# Patient Record
Sex: Female | Born: 1955
Health system: Southern US, Community
[De-identification: ages and names within clinical notes are randomized; demographics above are authoritative.]

## PROBLEM LIST (undated history)

## (undated) DIAGNOSIS — E119 Type 2 diabetes mellitus without complications: Secondary | ICD-10-CM

## (undated) DIAGNOSIS — J45909 Unspecified asthma, uncomplicated: Secondary | ICD-10-CM

## (undated) DIAGNOSIS — R7303 Prediabetes: Secondary | ICD-10-CM

## (undated) DIAGNOSIS — N200 Calculus of kidney: Secondary | ICD-10-CM

## (undated) HISTORY — PX: KIDNEY STONE SURGERY: SHX686

## (undated) HISTORY — DX: Prediabetes: R73.03

## (undated) HISTORY — DX: Type 2 diabetes mellitus without complications: E11.9

---

## 1999-01-09 ENCOUNTER — Encounter: Payer: Self-pay | Admitting: Family Medicine

## 1999-01-09 ENCOUNTER — Encounter: Admission: RE | Admit: 1999-01-09 | Discharge: 1999-01-09 | Payer: Self-pay | Admitting: Family Medicine

## 2000-12-27 ENCOUNTER — Encounter: Admission: RE | Admit: 2000-12-27 | Discharge: 2000-12-27 | Payer: Self-pay | Admitting: Family Medicine

## 2000-12-27 ENCOUNTER — Encounter: Payer: Self-pay | Admitting: Family Medicine

## 2006-01-14 ENCOUNTER — Emergency Department (HOSPITAL_COMMUNITY): Admission: EM | Admit: 2006-01-14 | Discharge: 2006-01-14 | Payer: Self-pay | Admitting: Emergency Medicine

## 2006-10-19 ENCOUNTER — Encounter: Admission: RE | Admit: 2006-10-19 | Discharge: 2006-10-19 | Payer: Self-pay | Admitting: Family Medicine

## 2007-10-15 ENCOUNTER — Emergency Department (HOSPITAL_COMMUNITY): Admission: EM | Admit: 2007-10-15 | Discharge: 2007-10-16 | Payer: Self-pay | Admitting: Emergency Medicine

## 2007-11-24 ENCOUNTER — Ambulatory Visit (HOSPITAL_COMMUNITY): Admission: RE | Admit: 2007-11-24 | Discharge: 2007-11-24 | Payer: Self-pay | Admitting: *Deleted

## 2010-05-22 IMAGING — CT CT ABDOMEN W/O CM
2 of 4 series · 17 of 46 positions shown, 19 images · non-contrast
Comparison: Ultrasound 10/19/2006

CT ABDOMEN

CLINICAL DATA: Generalized abdominal pain.

CT ABDOMEN AND PELVIS WITHOUT CONTRAST
TECHNIQUE: Multidetector CT imaging of the abdomen and pelvis was
performed following the standard
protocol without intravenous contrast.

[Series 2: abd_ 5.0 b40f st · axial · 0.59mm/px · z∈[-429,-24]mm · 14 of 89 slices shown, 16 images]
[im 4/89  soft-tissue]
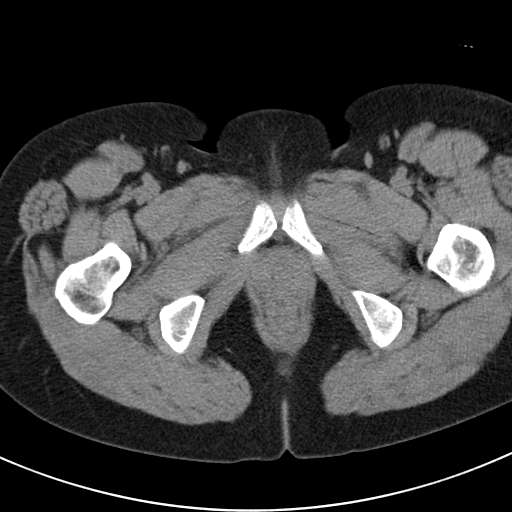
[im 4/89  bone]
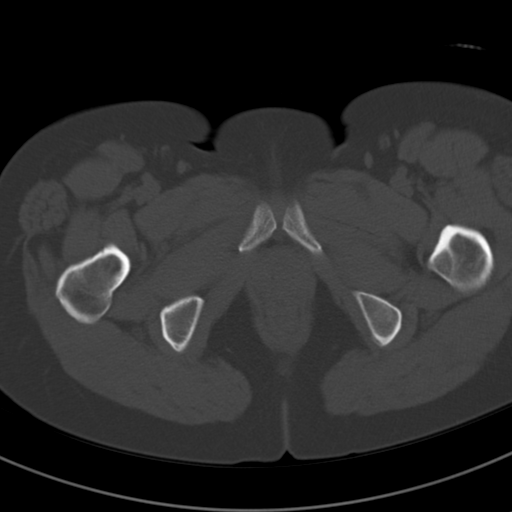
[im 12/89  soft-tissue]
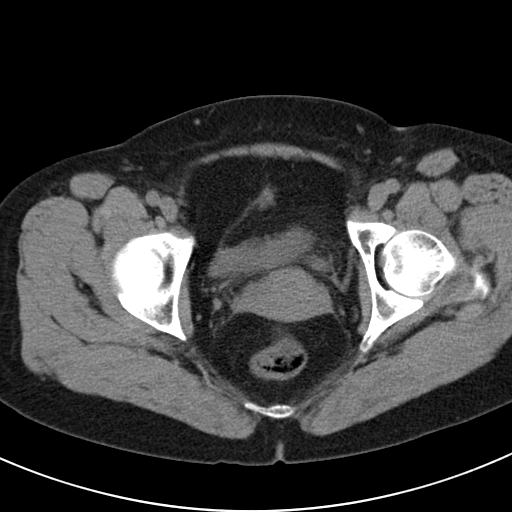
[im 19/89  soft-tissue]
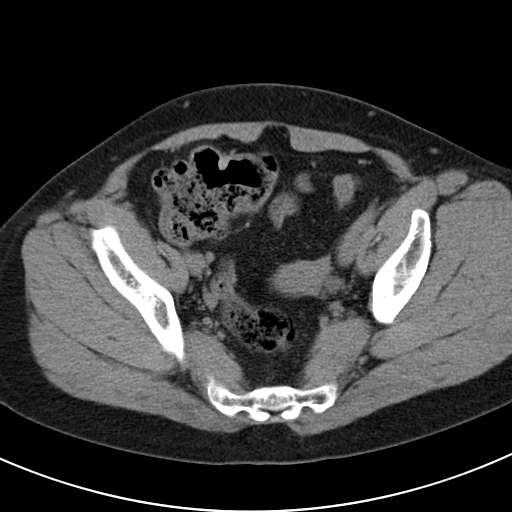
[im 23/89  soft-tissue]
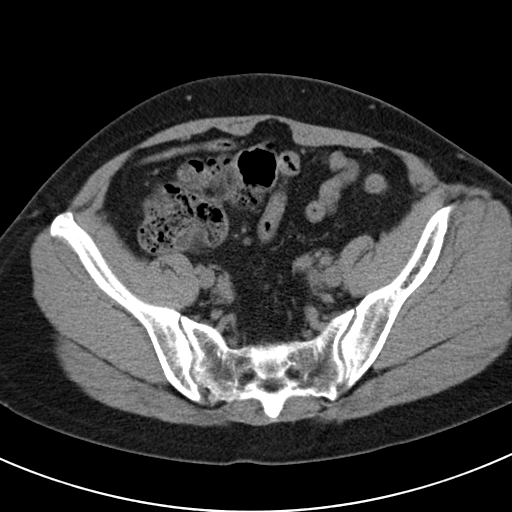
[im 30/89  soft-tissue]
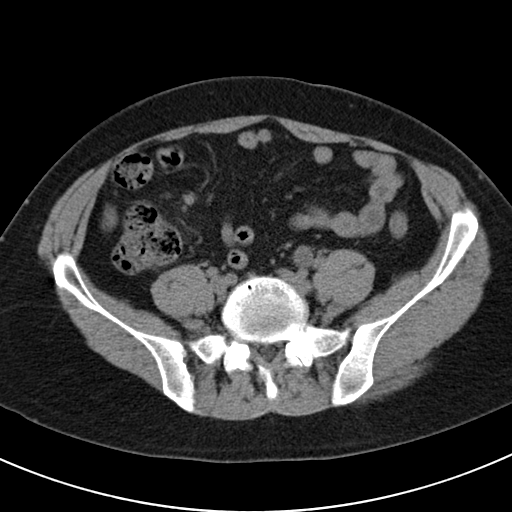
[im 37/89  soft-tissue]
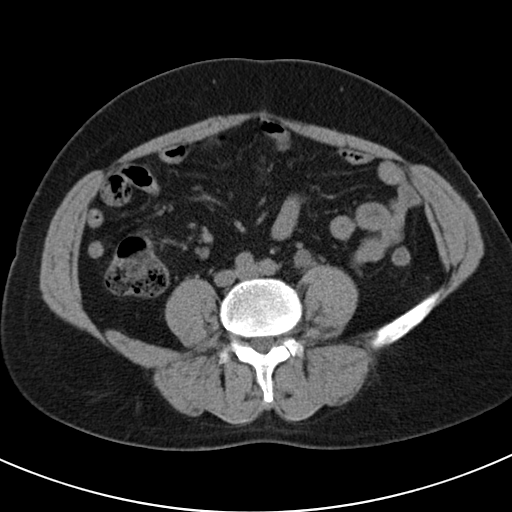
[im 41/89  soft-tissue]
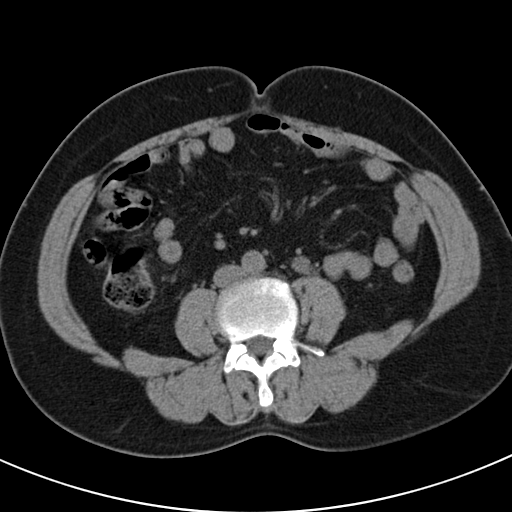
[im 48/89  soft-tissue]
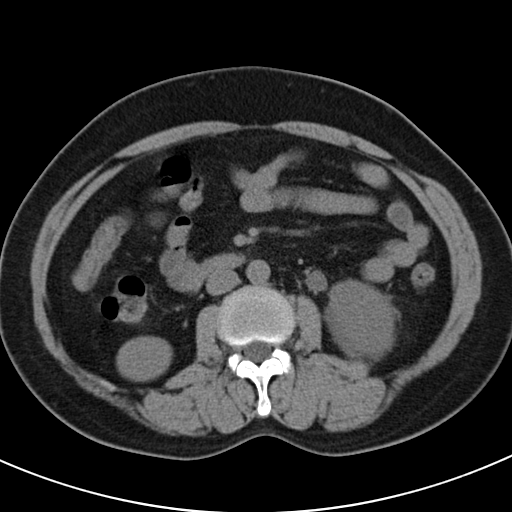
[im 52/89  soft-tissue]
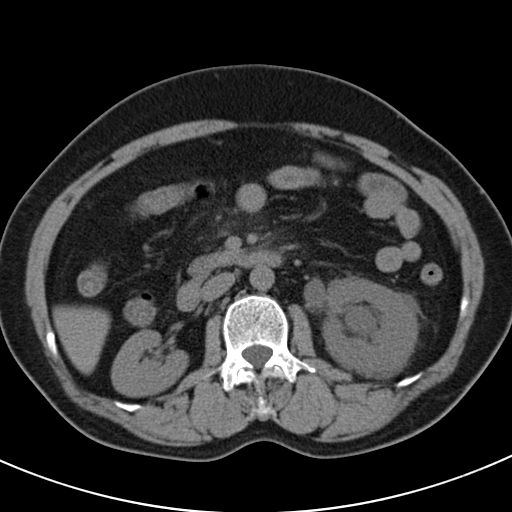
[im 52/89  bone]
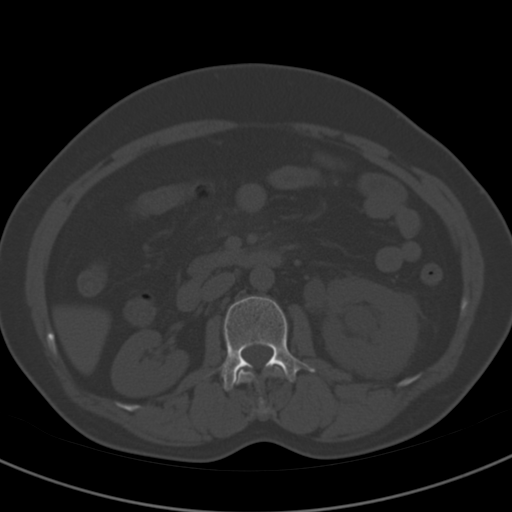
[im 59/89  soft-tissue]
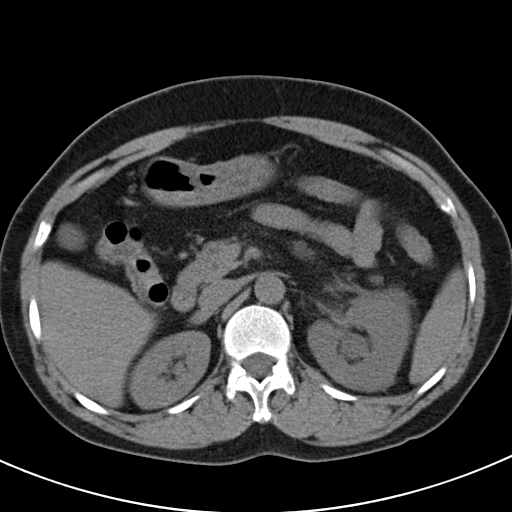
[im 67/89  soft-tissue]
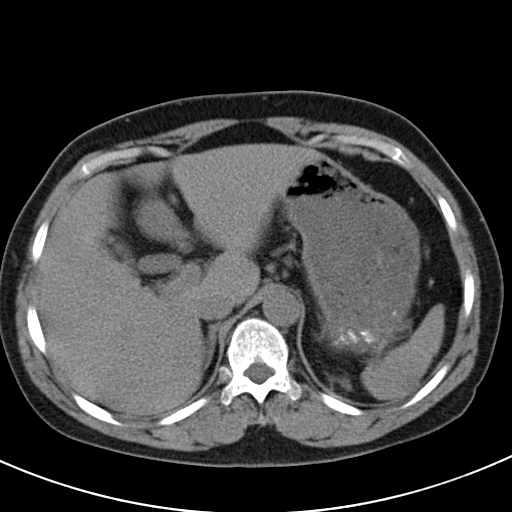
[im 70/89  soft-tissue]
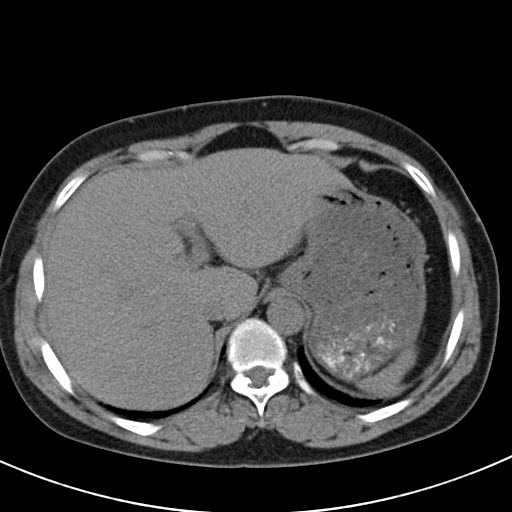
[im 78/89  soft-tissue]
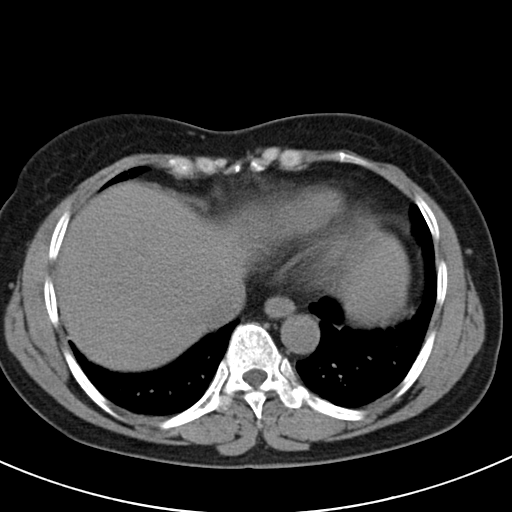
[im 85/89  soft-tissue]
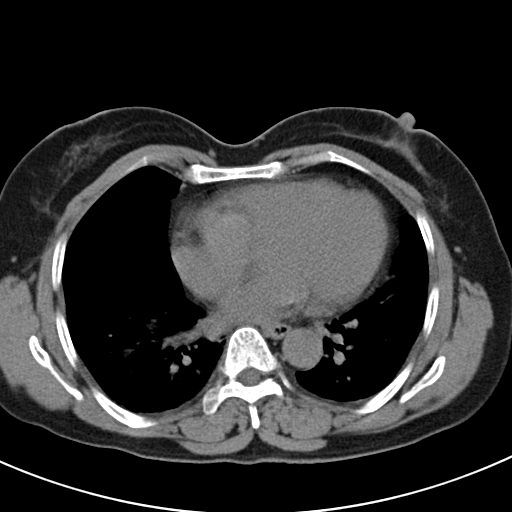

[Series 602: coronal abdomen · coronal · 0.90mm/px · 3 of 106 slices shown]
[im 36/106  soft-tissue]
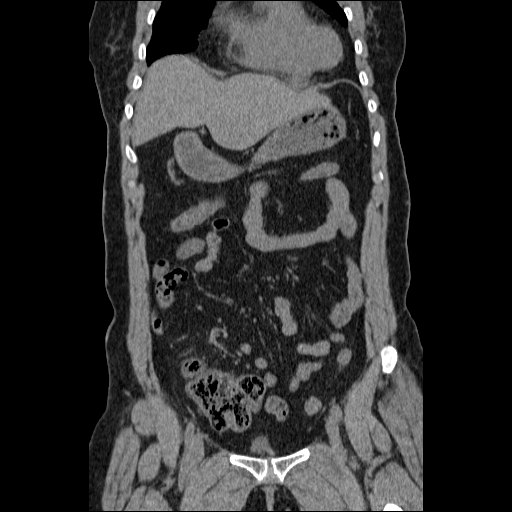
[im 47/106  soft-tissue]
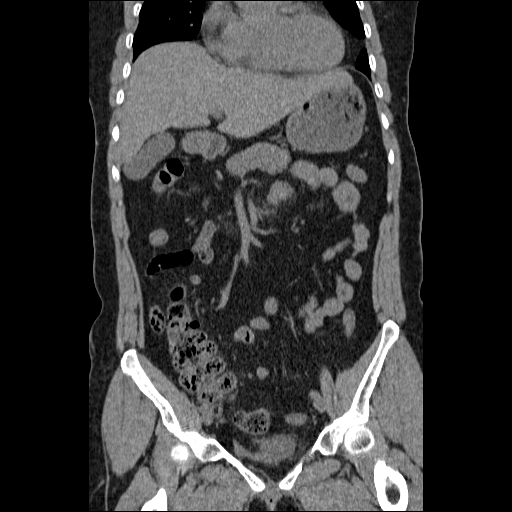
[im 59/106  soft-tissue]
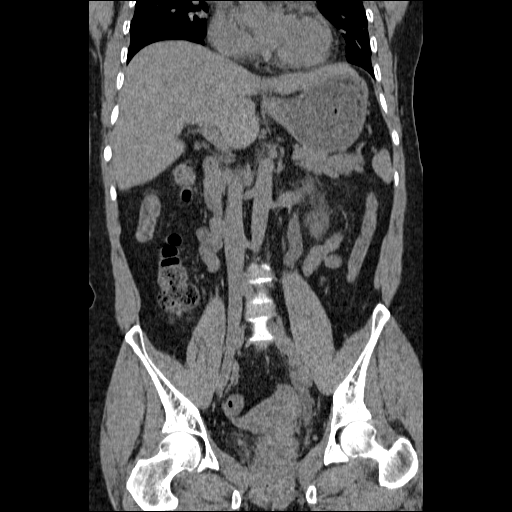

[17 of 46 positions shown; findings below may reference images not displayed]

FINDINGS: Bibasilar airspace disease is more than expected for
atelectasis.  Atypical infection or aspiration is considered.  The
findings could be chronic.  Heart size is enlarged.  There is no
significant pleural or pericardial effusion.

The uninfused appearance of the liver and spleen is normal.  The
pancreas common bile duct and gallbladder are within normal limits.
The adrenal glands and right kidney are normal.  Moderate left-
sided hydroureter nephrosis can be followed into the pelvis where
there is an obstructing 5.5 mm stone at the left ureteral vesicle
junction.  There is no significant abdominal lymphadenopathy or
free fluid.  The minimal mesenteric stranding is noted just below
the pancreas.  This is nonspecific.  Bone windows are unremarkable.
IMPRESSION: 1.  Moderate left hydroureter nephrosis with an obstructing distal
left UVJ stone.
2.  No additional nephrolithiasis identified.
3.  Bilateral lower lobe airspace disease.  This raises concern for
infection or aspiration.  Findings could be chronic.

CT PELVIS
FINDINGS: As stated in the above report, there is no obstructing
5.5 mm left ureteral vesicle junction stone.  The right ureter is
within normal limits.  The uterus and adnexa are within normal
limits.  The rectosigmoid colon is unremarkable.  Much of the
transverse colon is collapsed.  The appendix is visualized and
within normal limits.  The bone windows demonstrate mild
degenerative changes in the SI joints bilaterally and right greater
than left hip.
IMPRESSION: 1.  5.5 millimeter obstructing distal left UVJ stone.
2.  Otherwise unremarkable CT the pelvis.

## 2010-11-11 ENCOUNTER — Other Ambulatory Visit: Payer: Self-pay | Admitting: Family Medicine

## 2010-11-11 ENCOUNTER — Ambulatory Visit
Admission: RE | Admit: 2010-11-11 | Discharge: 2010-11-11 | Disposition: A | Discharge status: Home / Self Care | Payer: 59 | Source: Ambulatory Visit | Attending: Family Medicine | Admitting: Family Medicine

## 2010-12-05 LAB — URINALYSIS, ROUTINE W REFLEX MICROSCOPIC
Glucose, UA: NEGATIVE
Ketones, ur: NEGATIVE
Nitrite: NEGATIVE
Protein, ur: NEGATIVE
Urobilinogen, UA: 0.2

## 2010-12-05 LAB — POCT I-STAT, CHEM 8
BUN: 14
Creatinine, Ser: 1.3 — ABNORMAL HIGH
Glucose, Bld: 120 — ABNORMAL HIGH
Sodium: 140
TCO2: 24

## 2010-12-05 LAB — LIPASE, BLOOD: Lipase: 18

## 2010-12-05 LAB — PREGNANCY, URINE: Preg Test, Ur: NEGATIVE

## 2012-11-24 DIAGNOSIS — J069 Acute upper respiratory infection, unspecified: Secondary | ICD-10-CM | POA: Diagnosis not present

## 2012-11-24 DIAGNOSIS — J9801 Acute bronchospasm: Secondary | ICD-10-CM | POA: Diagnosis not present

## 2012-12-21 ENCOUNTER — Emergency Department (HOSPITAL_COMMUNITY): Payer: Medicare Other

## 2012-12-21 ENCOUNTER — Inpatient Hospital Stay (HOSPITAL_COMMUNITY)
Admission: EM | Admit: 2012-12-21 | Discharge: 2012-12-22 | DRG: 419 | Disposition: A | Discharge status: Home / Self Care | Payer: Medicare Other | Attending: General Surgery | Admitting: General Surgery

## 2012-12-21 ENCOUNTER — Encounter (HOSPITAL_COMMUNITY): Payer: Self-pay | Admitting: Emergency Medicine

## 2012-12-21 ENCOUNTER — Encounter (HOSPITAL_COMMUNITY): Payer: Medicare Other | Admitting: Anesthesiology

## 2012-12-21 ENCOUNTER — Encounter (HOSPITAL_COMMUNITY)
Admission: EM | Disposition: A | Discharge status: Home / Self Care | Payer: Self-pay | Source: Home / Self Care | Attending: General Surgery

## 2012-12-21 ENCOUNTER — Inpatient Hospital Stay (HOSPITAL_COMMUNITY): Payer: Medicare Other | Admitting: Anesthesiology

## 2012-12-21 DIAGNOSIS — K811 Chronic cholecystitis: Secondary | ICD-10-CM

## 2012-12-21 DIAGNOSIS — K8000 Calculus of gallbladder with acute cholecystitis without obstruction: Secondary | ICD-10-CM | POA: Diagnosis not present

## 2012-12-21 DIAGNOSIS — Z23 Encounter for immunization: Secondary | ICD-10-CM | POA: Diagnosis not present

## 2012-12-21 DIAGNOSIS — K819 Cholecystitis, unspecified: Secondary | ICD-10-CM | POA: Diagnosis not present

## 2012-12-21 DIAGNOSIS — K824 Cholesterolosis of gallbladder: Secondary | ICD-10-CM | POA: Diagnosis not present

## 2012-12-21 DIAGNOSIS — K810 Acute cholecystitis: Secondary | ICD-10-CM | POA: Diagnosis not present

## 2012-12-21 DIAGNOSIS — R109 Unspecified abdominal pain: Secondary | ICD-10-CM | POA: Diagnosis not present

## 2012-12-21 DIAGNOSIS — R079 Chest pain, unspecified: Secondary | ICD-10-CM | POA: Diagnosis not present

## 2012-12-21 DIAGNOSIS — R1013 Epigastric pain: Secondary | ICD-10-CM | POA: Diagnosis not present

## 2012-12-21 DIAGNOSIS — K8020 Calculus of gallbladder without cholecystitis without obstruction: Secondary | ICD-10-CM | POA: Diagnosis not present

## 2012-12-21 HISTORY — DX: Calculus of kidney: N20.0

## 2012-12-21 HISTORY — PX: LAPAROSCOPIC CHOLECYSTECTOMY: SUR755

## 2012-12-21 HISTORY — PX: CHOLECYSTECTOMY: SHX55

## 2012-12-21 HISTORY — DX: Unspecified asthma, uncomplicated: J45.909

## 2012-12-21 LAB — URINALYSIS, ROUTINE W REFLEX MICROSCOPIC
Glucose, UA: NEGATIVE mg/dL
Hgb urine dipstick: NEGATIVE
Ketones, ur: NEGATIVE mg/dL
Protein, ur: NEGATIVE mg/dL

## 2012-12-21 LAB — CBC WITH DIFFERENTIAL/PLATELET
Eosinophils Absolute: 0.7 10*3/uL (ref 0.0–0.7)
Eosinophils Relative: 10 % — ABNORMAL HIGH (ref 0–5)
Lymphs Abs: 2.6 10*3/uL (ref 0.7–4.0)
MCH: 25.8 pg — ABNORMAL LOW (ref 26.0–34.0)
MCHC: 34.5 g/dL (ref 30.0–36.0)
MCV: 75 fL — ABNORMAL LOW (ref 78.0–100.0)
Monocytes Relative: 6 % (ref 3–12)
Platelets: 189 10*3/uL (ref 150–400)
RBC: 5.15 MIL/uL — ABNORMAL HIGH (ref 3.87–5.11)

## 2012-12-21 LAB — COMPREHENSIVE METABOLIC PANEL
BUN: 11 mg/dL (ref 6–23)
CO2: 23 mEq/L (ref 19–32)
Calcium: 8.1 mg/dL — ABNORMAL LOW (ref 8.4–10.5)
Creatinine, Ser: 0.73 mg/dL (ref 0.50–1.10)
GFR calc Af Amer: >90 mL/min (ref >90)
GFR calc non Af Amer: >90 mL/min (ref >90)
Glucose, Bld: 119 mg/dL — ABNORMAL HIGH (ref 70–99)

## 2012-12-21 SURGERY — LAPAROSCOPIC CHOLECYSTECTOMY
Anesthesia: General | Site: Abdomen | Wound class: Clean Contaminated

## 2012-12-21 MED ORDER — HYDROMORPHONE HCL PF 1 MG/ML IJ SOLN: 0.2500 mg | INTRAMUSCULAR | Status: DC | PRN

## 2012-12-21 MED ORDER — SODIUM CHLORIDE 0.9 % IV BOLUS (SEPSIS)
1000.0000 mL | Freq: Once | INTRAVENOUS | Status: AC
  Administered 2012-12-21: 1000 mL via INTRAVENOUS

## 2012-12-21 MED ORDER — OXYCODONE-ACETAMINOPHEN 5-325 MG PO TABS: 1.0000 | ORAL_TABLET | ORAL | Status: DC | PRN

## 2012-12-21 MED ORDER — FENTANYL CITRATE 0.05 MG/ML IJ SOLN
50.0000 ug | Freq: Once | INTRAMUSCULAR | Status: AC
  Administered 2012-12-21: 50 ug via INTRAVENOUS
  Filled 2012-12-21: qty 2

## 2012-12-21 MED ORDER — PANTOPRAZOLE SODIUM 40 MG IV SOLR
40.0000 mg | Freq: Every day | INTRAVENOUS | Status: DC
  Administered 2012-12-21: 40 mg via INTRAVENOUS
  Filled 2012-12-21 (×2): qty 40

## 2012-12-21 MED ORDER — HYDROCODONE-ACETAMINOPHEN 5-325 MG PO TABS: 1.0000 | ORAL_TABLET | ORAL | Status: DC | PRN

## 2012-12-21 MED ORDER — PROMETHAZINE HCL 25 MG/ML IJ SOLN
INTRAMUSCULAR | Status: AC
  Filled 2012-12-21: qty 1

## 2012-12-21 MED ORDER — METOCLOPRAMIDE HCL 5 MG/ML IJ SOLN
5.0000 mg | Freq: Once | INTRAMUSCULAR | Status: AC
  Administered 2012-12-21: 5 mg via INTRAVENOUS
  Filled 2012-12-21: qty 2

## 2012-12-21 MED ORDER — ONDANSETRON HCL 4 MG/2ML IJ SOLN
4.0000 mg | Freq: Once | INTRAMUSCULAR | Status: AC
  Administered 2012-12-21: 4 mg via INTRAVENOUS
  Filled 2012-12-21: qty 2

## 2012-12-21 MED ORDER — ONDANSETRON HCL 4 MG/2ML IJ SOLN: 4.0000 mg | Freq: Four times a day (QID) | INTRAMUSCULAR | Status: DC | PRN

## 2012-12-21 MED ORDER — IOHEXOL 300 MG/ML  SOLN
25.0000 mL | INTRAMUSCULAR | Status: AC
  Administered 2012-12-21: 25 mL via ORAL

## 2012-12-21 MED ORDER — OXYCODONE HCL 5 MG PO TABS
5.0000 mg | ORAL_TABLET | Freq: Once | ORAL | Status: AC | PRN
  Administered 2012-12-21: 5 mg via ORAL

## 2012-12-21 MED ORDER — GLYCOPYRROLATE 0.2 MG/ML IJ SOLN
INTRAMUSCULAR | Status: DC | PRN
  Administered 2012-12-21: .5 mg via INTRAVENOUS

## 2012-12-21 MED ORDER — PIPERACILLIN-TAZOBACTAM 3.375 G IVPB 30 MIN
3.3750 g | INTRAVENOUS | Status: DC
  Filled 2012-12-21 (×2): qty 50

## 2012-12-21 MED ORDER — ENOXAPARIN SODIUM 40 MG/0.4ML ~~LOC~~ SOLN
40.0000 mg | SUBCUTANEOUS | Status: DC
  Administered 2012-12-22: 40 mg via SUBCUTANEOUS
  Filled 2012-12-21: qty 0.4

## 2012-12-21 MED ORDER — HYDROMORPHONE HCL PF 1 MG/ML IJ SOLN
0.2500 mg | INTRAMUSCULAR | Status: DC | PRN
  Administered 2012-12-21: 0.25 mg via INTRAVENOUS
  Administered 2012-12-21: 0.5 mg via INTRAVENOUS
  Administered 2012-12-21: 0.25 mg via INTRAVENOUS

## 2012-12-21 MED ORDER — HYDROMORPHONE HCL PF 1 MG/ML IJ SOLN
1.0000 mg | INTRAMUSCULAR | Status: DC | PRN
Start: 2012-12-21 — End: 2012-12-22
  Administered 2012-12-22: 1 mg via INTRAVENOUS
  Filled 2012-12-21: qty 1

## 2012-12-21 MED ORDER — BUPIVACAINE HCL 0.25 % IJ SOLN
INTRAMUSCULAR | Status: DC | PRN
  Administered 2012-12-21: 30 mL

## 2012-12-21 MED ORDER — PROMETHAZINE HCL 25 MG/ML IJ SOLN
6.2500 mg | INTRAMUSCULAR | Status: DC | PRN
  Administered 2012-12-21: 6.25 mg via INTRAVENOUS

## 2012-12-21 MED ORDER — OXYCODONE HCL 5 MG/5ML PO SOLN: 5.0000 mg | Freq: Once | ORAL | Status: AC | PRN

## 2012-12-21 MED ORDER — LIDOCAINE HCL (CARDIAC) 20 MG/ML IV SOLN
INTRAVENOUS | Status: DC | PRN
  Administered 2012-12-21: 80 mg via INTRAVENOUS

## 2012-12-21 MED ORDER — ROCURONIUM BROMIDE 100 MG/10ML IV SOLN
INTRAVENOUS | Status: DC | PRN
  Administered 2012-12-21 (×2): 10 mg via INTRAVENOUS
  Administered 2012-12-21: 5 mg via INTRAVENOUS

## 2012-12-21 MED ORDER — LACTATED RINGERS IV SOLN
INTRAVENOUS | Status: DC | PRN
  Administered 2012-12-21 (×2): via INTRAVENOUS

## 2012-12-21 MED ORDER — SUFENTANIL CITRATE 50 MCG/ML IV SOLN
INTRAVENOUS | Status: DC | PRN
  Administered 2012-12-21: 5 ug via INTRAVENOUS

## 2012-12-21 MED ORDER — INFLUENZA VAC SPLIT QUAD 0.5 ML IM SUSP
0.5000 mL | INTRAMUSCULAR | Status: AC
  Administered 2012-12-22: 0.5 mL via INTRAMUSCULAR
  Filled 2012-12-21: qty 0.5

## 2012-12-21 MED ORDER — ACETAMINOPHEN 325 MG PO TABS: 650.0000 mg | ORAL_TABLET | Freq: Four times a day (QID) | ORAL | Status: DC | PRN

## 2012-12-21 MED ORDER — HYDROMORPHONE HCL PF 1 MG/ML IJ SOLN
INTRAMUSCULAR | Status: AC
  Filled 2012-12-21: qty 1

## 2012-12-21 MED ORDER — HYDROMORPHONE HCL PF 1 MG/ML IJ SOLN
0.5000 mg | Freq: Once | INTRAMUSCULAR | Status: AC
  Administered 2012-12-21: 0.5 mg via INTRAVENOUS
  Filled 2012-12-21: qty 1

## 2012-12-21 MED ORDER — ONDANSETRON HCL 4 MG/2ML IJ SOLN
INTRAMUSCULAR | Status: AC
  Administered 2012-12-21: 4 mg via INTRAVENOUS
  Filled 2012-12-21: qty 2

## 2012-12-21 MED ORDER — MORPHINE SULFATE 2 MG/ML IJ SOLN: 2.0000 mg | INTRAMUSCULAR | Status: DC | PRN

## 2012-12-21 MED ORDER — SODIUM CHLORIDE 0.9 % IR SOLN
Status: DC | PRN
  Administered 2012-12-21: 1000 mL

## 2012-12-21 MED ORDER — ONDANSETRON HCL 4 MG/2ML IJ SOLN
4.0000 mg | Freq: Once | INTRAMUSCULAR | Status: AC
  Administered 2012-12-21: 4 mg via INTRAVENOUS

## 2012-12-21 MED ORDER — PROPOFOL 10 MG/ML IV BOLUS
INTRAVENOUS | Status: DC | PRN
  Administered 2012-12-21: 140 mg via INTRAVENOUS

## 2012-12-21 MED ORDER — POTASSIUM CHLORIDE IN NACL 20-0.9 MEQ/L-% IV SOLN
INTRAVENOUS | Status: DC
  Administered 2012-12-21: 21:00:00 via INTRAVENOUS
  Filled 2012-12-21 (×4): qty 1000

## 2012-12-21 MED ORDER — OXYCODONE HCL 5 MG PO TABS
ORAL_TABLET | ORAL | Status: AC
  Filled 2012-12-21: qty 1

## 2012-12-21 MED ORDER — IOHEXOL 300 MG/ML  SOLN
80.0000 mL | Freq: Once | INTRAMUSCULAR | Status: AC | PRN
  Administered 2012-12-21: 70 mL via INTRAVENOUS

## 2012-12-21 MED ORDER — DIPHENHYDRAMINE HCL 50 MG/ML IJ SOLN: 12.5000 mg | Freq: Four times a day (QID) | INTRAMUSCULAR | Status: DC | PRN

## 2012-12-21 MED ORDER — PIPERACILLIN-TAZOBACTAM 3.375 G IVPB
3.3750 g | Freq: Three times a day (TID) | INTRAVENOUS | Status: DC
  Administered 2012-12-22: 3.375 g via INTRAVENOUS
  Filled 2012-12-21 (×3): qty 50

## 2012-12-21 MED ORDER — MIDAZOLAM HCL 2 MG/2ML IJ SOLN: 1.0000 mg | INTRAMUSCULAR | Status: DC | PRN

## 2012-12-21 MED ORDER — NEOSTIGMINE METHYLSULFATE 1 MG/ML IJ SOLN
INTRAMUSCULAR | Status: DC | PRN
  Administered 2012-12-21: 3 mg via INTRAVENOUS

## 2012-12-21 MED ORDER — MIDAZOLAM HCL 5 MG/5ML IJ SOLN
INTRAMUSCULAR | Status: DC | PRN
  Administered 2012-12-21: 0.5 mg via INTRAVENOUS
  Administered 2012-12-21: 1 mg via INTRAVENOUS

## 2012-12-21 MED ORDER — ONDANSETRON HCL 4 MG/2ML IJ SOLN
INTRAMUSCULAR | Status: DC | PRN
  Administered 2012-12-21: 4 mg via INTRAMUSCULAR

## 2012-12-21 MED ORDER — SUCCINYLCHOLINE CHLORIDE 20 MG/ML IJ SOLN
INTRAMUSCULAR | Status: DC | PRN
  Administered 2012-12-21: 100 mg via INTRAVENOUS

## 2012-12-21 MED ORDER — ONDANSETRON HCL 4 MG PO TABS: 4.0000 mg | ORAL_TABLET | Freq: Four times a day (QID) | ORAL | Status: DC | PRN

## 2012-12-21 MED ORDER — ACETAMINOPHEN 650 MG RE SUPP: 650.0000 mg | Freq: Four times a day (QID) | RECTAL | Status: DC | PRN

## 2012-12-21 MED ORDER — DIPHENHYDRAMINE HCL 12.5 MG/5ML PO ELIX: 12.5000 mg | ORAL_SOLUTION | Freq: Four times a day (QID) | ORAL | Status: DC | PRN

## 2012-12-21 MED ORDER — SODIUM CHLORIDE 0.9 % IR SOLN
Status: DC | PRN
  Administered 2012-12-21 (×2): 1000 mL

## 2012-12-21 MED ORDER — BUPIVACAINE HCL (PF) 0.25 % IJ SOLN
INTRAMUSCULAR | Status: AC
  Filled 2012-12-21: qty 30

## 2012-12-21 MED ORDER — SODIUM CHLORIDE 0.9 % IV SOLN
Freq: Once | INTRAVENOUS | Status: AC
  Administered 2012-12-21: 13:00:00 via INTRAVENOUS

## 2012-12-21 MED ORDER — PIPERACILLIN-TAZOBACTAM 3.375 G IVPB
3.3750 g | Freq: Three times a day (TID) | INTRAVENOUS | Status: DC
  Administered 2012-12-21: 3.375 g via INTRAVENOUS

## 2012-12-21 SURGICAL SUPPLY — 38 items
APPLIER CLIP 5 13 M/L LIGAMAX5 (MISCELLANEOUS) ×3
BENZOIN TINCTURE PRP APPL 2/3 (GAUZE/BANDAGES/DRESSINGS) ×3 IMPLANT
CANISTER SUCTION 2500CC (MISCELLANEOUS) ×3 IMPLANT
CHLORAPREP W/TINT 26ML (MISCELLANEOUS) ×3 IMPLANT
CLIP APPLIE 5 13 M/L LIGAMAX5 (MISCELLANEOUS) ×2 IMPLANT
CLIP LIGATING HEMO O LOK GREEN (MISCELLANEOUS) ×6 IMPLANT
COVER MAYO STAND STRL (DRAPES) ×3 IMPLANT
COVER SURGICAL LIGHT HANDLE (MISCELLANEOUS) ×3 IMPLANT
COVER TRANSDUCER ULTRASND (DRAPES) IMPLANT
DEVICE TROCAR PUNCTURE CLOSURE (ENDOMECHANICALS) ×3 IMPLANT
DRAPE C-ARM 42X72 X-RAY (DRAPES) ×3 IMPLANT
DRAPE UTILITY 15X26 W/TAPE STR (DRAPE) ×6 IMPLANT
ELECT REM PT RETURN 9FT ADLT (ELECTROSURGICAL) ×3
ELECTRODE REM PT RTRN 9FT ADLT (ELECTROSURGICAL) ×2 IMPLANT
GAUZE SPONGE 2X2 8PLY STRL LF (GAUZE/BANDAGES/DRESSINGS) ×2 IMPLANT
GLOVE BIO SURGEON STRL SZ7.5 (GLOVE) ×3 IMPLANT
GOWN STRL NON-REIN LRG LVL3 (GOWN DISPOSABLE) ×9 IMPLANT
GOWN STRL REIN XL XLG (GOWN DISPOSABLE) ×3 IMPLANT
IV CATH 14GX2 1/4 (CATHETERS) ×3 IMPLANT
KIT BASIN OR (CUSTOM PROCEDURE TRAY) ×3 IMPLANT
KIT ROOM TURNOVER OR (KITS) ×3 IMPLANT
NEEDLE INSUFFLATION 14GA 120MM (NEEDLE) ×3 IMPLANT
NS IRRIG 1000ML POUR BTL (IV SOLUTION) ×3 IMPLANT
PAD ARMBOARD 7.5X6 YLW CONV (MISCELLANEOUS) ×6 IMPLANT
POUCH SPECIMEN RETRIEVAL 10MM (ENDOMECHANICALS) IMPLANT
SCISSORS LAP 5X35 DISP (ENDOMECHANICALS) ×3 IMPLANT
SET CHOLANGIOGRAPHY FRANKLIN (SET/KITS/TRAYS/PACK) ×3 IMPLANT
SET IRRIG TUBING LAPAROSCOPIC (IRRIGATION / IRRIGATOR) ×3 IMPLANT
SLEEVE ENDOPATH XCEL 5M (ENDOMECHANICALS) ×3 IMPLANT
SPECIMEN JAR SMALL (MISCELLANEOUS) ×3 IMPLANT
SPONGE GAUZE 2X2 STER 10/PKG (GAUZE/BANDAGES/DRESSINGS) ×1
SUT MNCRL AB 3-0 PS2 18 (SUTURE) ×6 IMPLANT
SUT VIC AB 1 BRD 54 (SUTURE) ×3 IMPLANT
TOWEL OR 17X24 6PK STRL BLUE (TOWEL DISPOSABLE) ×3 IMPLANT
TOWEL OR 17X26 10 PK STRL BLUE (TOWEL DISPOSABLE) ×3 IMPLANT
TRAY LAPAROSCOPIC (CUSTOM PROCEDURE TRAY) ×3 IMPLANT
TROCAR XCEL NON-BLD 11X100MML (ENDOMECHANICALS) ×3 IMPLANT
TROCAR XCEL NON-BLD 5MMX100MML (ENDOMECHANICALS) ×3 IMPLANT

## 2012-12-21 NOTE — Anesthesia Procedure Notes (Signed)
Procedure Name: Intubation Date/Time: 12/21/2012 4:41 PM Performed by: Coralee Rud Pre-anesthesia Checklist: Patient identified, Emergency Drugs available, Suction available, Patient being monitored and Timeout performed Patient Re-evaluated:Patient Re-evaluated prior to inductionOxygen Delivery Method: Circle system utilized Preoxygenation: Pre-oxygenation with 100% oxygen Intubation Type: IV induction Ventilation: Mask ventilation without difficulty Laryngoscope Size: Miller and 2 Grade View: Grade II Tube type: Oral Tube size: 7.0 mm Number of attempts: 1 Airway Equipment and Method: Stylet Placement Confirmation: ETT inserted through vocal cords under direct vision and positive ETCO2 Secured at: 20 cm Tube secured with: Tape Dental Injury: Teeth and Oropharynx as per pre-operative assessment

## 2012-12-21 NOTE — ED Notes (Signed)
Mare Loan, granddaughter phone number [phone]

## 2012-12-21 NOTE — Transfer of Care (Signed)
Immediate Anesthesia Transfer of Care Note  Patient: Tina [name]  Procedure(s) Performed: Procedure(s): LAPAROSCOPIC CHOLECYSTECTOMY (N/A)  Patient Location: PACU  Anesthesia Type:General  Level of Consciousness: unresponsive  Airway & Oxygen Therapy: Patient Spontanous Breathing and Patient connected to face mask oxygen  Post-op Assessment: Report given to PACU RN, Post -op Vital signs reviewed and unstable, Anesthesiologist notified and Patient moving all extremities  Post vital signs: Reviewed and stable  Complications: No apparent anesthesia complications

## 2012-12-21 NOTE — ED Provider Notes (Signed)
CSN: [id number]     Arrival date & time 12/21/12  7829 History   First MD Initiated Contact with Patient 12/21/12 [phone]     Chief Complaint  Patient presents with  . Chest Pain  . Abdominal Pain   (Consider location/radiation/quality/duration/timing/severity/associated sxs/prior Treatment) HPI.... level 5 caveat for urgent need for intervention. Epigastric pain since 2 AM with associated fever. No nausea vomiting or diarrhea. No previous surgery. No chronic health problems. Takes no medication.  Nothing makes symptoms better or worse. Severity is moderate.  History reviewed. No pertinent past medical history. History reviewed. No pertinent past surgical history. History reviewed. No pertinent family history. History  Substance Use Topics  . Smoking status: Never Smoker   . Smokeless tobacco: Never Used  . Alcohol Use: No   OB History   Grav Para Term Preterm Abortions TAB SAB Ect Mult Living                 Review of Systems  All other systems reviewed and are negative.    Allergies  Review of patient's allergies indicates no known allergies.  Home Medications  No current outpatient prescriptions on file. BP 141/85  Pulse 96  Temp(Src) 97.8 F (36.6 C) (Oral)  Resp 16  SpO2 97% Physical Exam  Nursing note and vitals reviewed. Constitutional: She is oriented to person, place, and time. She appears well-developed and well-nourished.  HENT:  Head: Normocephalic and atraumatic.  Eyes: Conjunctivae and EOM are normal. Pupils are equal, round, and reactive to light.  Neck: Normal range of motion. Neck supple.  Cardiovascular: Normal rate, regular rhythm and normal heart sounds.   Pulmonary/Chest: Effort normal and breath sounds normal.  Abdominal: Soft. Bowel sounds are normal.  Tender epigastrium  Musculoskeletal: Normal range of motion.  Neurological: She is alert and oriented to person, place, and time.  Skin: Skin is warm and dry.  Psychiatric: She has a normal  mood and affect.    ED Course  Procedures (including critical care time) Labs Review Labs Reviewed  CBC WITH DIFFERENTIAL - Abnormal; Notable for the following:    RBC 5.15 (*)    MCV 75.0 (*)    MCH 25.8 (*)    Eosinophils Relative 10 (*)    All other components within normal limits  COMPREHENSIVE METABOLIC PANEL - Abnormal; Notable for the following:    Chloride 113 (*)    Glucose, Bld 119 (*)    Calcium 8.1 (*)    Albumin 3.3 (*)    ALT 38 (*)    Total Bilirubin 0.2 (*)    All other components within normal limits  URINALYSIS, ROUTINE W REFLEX MICROSCOPIC  LIPASE, BLOOD   Imaging Review Ct Abdomen Pelvis W Contrast  12/21/2012   CLINICAL DATA:  Severe bilateral upper quadrant pain radiating to the upper back with nausea and vomiting.  EXAM: CT ABDOMEN AND PELVIS WITH CONTRAST  TECHNIQUE: Multidetector CT imaging of the abdomen and pelvis was performed using the standard protocol following bolus administration of intravenous contrast.  CONTRAST:  70mL OMNIPAQUE IOHEXOL 300 MG/ML  SOLN  COMPARISON:  11/24/2007.  FINDINGS: Lung bases show mild atelectasis or scarring dependently. Heart is mildly enlarged. No pericardial or pleural effusion.  Liver is unremarkable. A large stone is seen in the gallbladder, measuring 2.3 x 2.8 cm. Smaller stone debris in the gallbladder fundus. Mild haziness around the gallbladder.  Adrenal glands, kidneys, spleen, pancreas, stomach, small bowel, colon and appendix are unremarkable. Uterus and ovaries are visualized.  Slight haziness nodularity at the root of the small bowel mesentery, unchanged. No pathologically enlarged lymph nodes. No free fluid. No worrisome lytic or sclerotic lesions.  IMPRESSION: Cholelithiasis with possible acute cholecystitis. Ultrasound would likely prove helpful in further evaluation, as clinically indicated.   Electronically Signed   By: Leanna Battles M.D.   On: 12/21/2012 12:06    EKG Interpretation     Ventricular Rate:   72 PR Interval:  75 QRS Duration: 119 QT Interval:  418 QTC Calculation: 461 R Axis:   93 Text Interpretation:  Sinus rhythm Short PR interval Nonspecific intraventricular conduction delay Borderline repolarization abnormality Minimal ST elevation, inferior leads Baseline wander in lead(s) V4            MDM  No diagnosis found. CT scan reveals a 2.3 x 2.8 CM gallstone. Discussed with general surgery. They will evaluate patient. Ultrasound pending.  No acute abdomen.   Donnetta Hutching, MD 12/21/12 [phone]

## 2012-12-21 NOTE — H&P (Signed)
Chief Complaint: abdominal pain, nausea and vomiting.  HPI: Tina [name] is a healthy [place] 57 year old female who presented to [facility] with abdominal pain, nausea and vomiting.  Duration of symptoms is 1 day.  Onset was sudden.  Coarse is unchanged.  Moderate in severity, now 6/10.  Location of pain is epigastric region.  Characterized as sharp severe pain which radiates to the back.  Associated with nausea and vomiting.  She reports previous symptoms, however, unable to characterize.  She denies fever, chills or sweats.  Denies diarrhea or constipation.  No modifying factors.  She reports eating rice for dinner last night, the pain began at 2AM. No history of crohn's, UC, ulcers or reflux.  She does not take any medication.  She lives with her husband. She speaks [place], history has been provided by her granddaughter.  Past Medical History  Diagnosis Date  . Asthma     Past Surgical History  Procedure Laterality Date  . Kidney stone surgery      History reviewed. No pertinent family history. Social History:  reports that she has never smoked. She has never used smokeless tobacco. She reports that she does not drink alcohol or use illicit drugs.  Allergies: No Known Allergies  No prescriptions prior to admission    Results for orders placed during the hospital encounter of 12/21/12 (from the past 48 hour(s))  CBC WITH DIFFERENTIAL     Status: Abnormal   Collection Time    12/21/12  8:05 AM      Result Value Range   WBC 7.3  4.0 - 10.5 K/uL   RBC 5.15 (*) 3.87 - 5.11 MIL/uL   Hemoglobin 13.3  12.0 - 15.0 g/dL   HCT 78.2  95.6 - 21.3 %   MCV 75.0 (*) 78.0 - 100.0 fL   MCH 25.8 (*) 26.0 - 34.0 pg   MCHC 34.5  30.0 - 36.0 g/dL   RDW 08.6  57.8 - 46.9 %   Platelets 189  150 - 400 K/uL   Neutrophils Relative % 48  43 - 77 %   Neutro Abs 3.5  1.7 - 7.7 K/uL   Lymphocytes Relative 35  12 - 46 %   Lymphs Abs 2.6  0.7 - 4.0 K/uL   Monocytes Relative 6  3 - 12 %   Monocytes  Absolute 0.4  0.1 - 1.0 K/uL   Eosinophils Relative 10 (*) 0 - 5 %   Eosinophils Absolute 0.7  0.0 - 0.7 K/uL   Basophils Relative 1  0 - 1 %   Basophils Absolute 0.0  0.0 - 0.1 K/uL  URINALYSIS, ROUTINE W REFLEX MICROSCOPIC     Status: None   Collection Time    12/21/12  8:27 AM      Result Value Range   Color, Urine YELLOW  YELLOW   APPearance CLEAR  CLEAR   Specific Gravity, Urine 1.008  1.005 - 1.030   pH 6.0  5.0 - 8.0   Glucose, UA NEGATIVE  NEGATIVE mg/dL   Hgb urine dipstick NEGATIVE  NEGATIVE   Bilirubin Urine NEGATIVE  NEGATIVE   Ketones, ur NEGATIVE  NEGATIVE mg/dL   Protein, ur NEGATIVE  NEGATIVE mg/dL   Urobilinogen, UA 0.2  0.0 - 1.0 mg/dL   Nitrite NEGATIVE  NEGATIVE   Leukocytes, UA NEGATIVE  NEGATIVE   Comment: MICROSCOPIC NOT DONE ON URINES WITH NEGATIVE PROTEIN, BLOOD, LEUKOCYTES, NITRITE, OR GLUCOSE <1000 mg/dL.  COMPREHENSIVE METABOLIC PANEL     Status:  Abnormal   Collection Time    12/21/12  8:50 AM      Result Value Range   Sodium 145  135 - 145 mEq/L   Potassium 3.5  3.5 - 5.1 mEq/L   Chloride 113 (*) 96 - 112 mEq/L   CO2 23  19 - 32 mEq/L   Glucose, Bld 119 (*) 70 - 99 mg/dL   BUN 11  6 - 23 mg/dL   Creatinine, Ser 4.54  0.50 - 1.10 mg/dL   Calcium 8.1 (*) 8.4 - 10.5 mg/dL   Total Protein 6.3  6.0 - 8.3 g/dL   Albumin 3.3 (*) 3.5 - 5.2 g/dL   AST 23  0 - 37 U/L   ALT 38 (*) 0 - 35 U/L   Alkaline Phosphatase 56  39 - 117 U/L   Total Bilirubin 0.2 (*) 0.3 - 1.2 mg/dL   GFR calc non Af Amer >90  >90 mL/min   GFR calc Af Amer >90  >90 mL/min   Comment: (NOTE)     The eGFR has been calculated using the CKD EPI equation.     This calculation has not been validated in all clinical situations.     eGFR's persistently <90 mL/min signify possible Chronic Kidney     Disease.  LIPASE, BLOOD     Status: None   Collection Time    12/21/12  8:50 AM      Result Value Range   Lipase 44  11 - 59 U/L   US Abdomen Complete  12/21/2012   CLINICAL DATA:   Abdominal pain  EXAM: ULTRASOUND ABDOMEN COMPLETE  COMPARISON:  Abdominal ultrasound October 19, 2006 and abdominal CT December 21, 2012  FINDINGS: Gallbladder  Gallbladder is visualized in multiple projections. Within the gallbladder, there are multiple echogenic foci which move and shadow consistent with gallstones. There is also gallbladder sludge. There is gallbladder wall thickening with gallbladder wall thickness measuring 5 mm. There is slight edema in the gallbladder wall.  Common bile duct  Diameter: 4 mm. There is no demonstrable intrahepatic, common hepatic, or common bile duct dilatation.  Liver  No focal lesion identified. Within normal limits in parenchymal echogenicity.  IVC  No abnormality visualized.  Pancreas  No pancreatic mass or inflammatory focus appreciated.  Spleen  Size and appearance within normal limits.  Right Kidney  Length: 9.4 cm. Echogenicity within normal limits. No mass or hydronephrosis visualized.  Left Kidney  Length: 10.4 cm. Echogenicity within normal limits. No mass or hydronephrosis visualized.  Abdominal aorta  No aneurysm visualized.  There is no appreciable ascites.  IMPRESSION: Findings consistent with acute cholecystitis.   Electronically Signed   By: Bretta Bang M.D.   On: 12/21/2012 14:42   Ct Abdomen Pelvis W Contrast  12/21/2012   CLINICAL DATA:  Severe bilateral upper quadrant pain radiating to the upper back with nausea and vomiting.  EXAM: CT ABDOMEN AND PELVIS WITH CONTRAST  TECHNIQUE: Multidetector CT imaging of the abdomen and pelvis was performed using the standard protocol following bolus administration of intravenous contrast.  CONTRAST:  70mL OMNIPAQUE IOHEXOL 300 MG/ML  SOLN  COMPARISON:  11/24/2007.  FINDINGS: Lung bases show mild atelectasis or scarring dependently. Heart is mildly enlarged. No pericardial or pleural effusion.  Liver is unremarkable. A large stone is seen in the gallbladder, measuring 2.3 x 2.8 cm. Smaller stone debris in the  gallbladder fundus. Mild haziness around the gallbladder.  Adrenal glands, kidneys, spleen, pancreas, stomach, small bowel, colon and  appendix are unremarkable. Uterus and ovaries are visualized. Slight haziness nodularity at the root of the small bowel mesentery, unchanged. No pathologically enlarged lymph nodes. No free fluid. No worrisome lytic or sclerotic lesions.  IMPRESSION: Cholelithiasis with possible acute cholecystitis. Ultrasound would likely prove helpful in further evaluation, as clinically indicated.   Electronically Signed   By: Leanna Battles M.D.   On: 12/21/2012 12:06    Review of Systems  Constitutional: Negative for fever, chills and weight loss.  Eyes: Negative for blurred vision, double vision and photophobia.  Respiratory: Negative for shortness of breath and wheezing.   Cardiovascular: Negative for chest pain and palpitations.  Gastrointestinal: Positive for nausea, vomiting and abdominal pain. Negative for diarrhea, constipation, blood in stool and melena.  Genitourinary: Negative for dysuria, urgency and hematuria.  Musculoskeletal: Positive for back pain.  Neurological: Positive for headaches. Negative for dizziness, focal weakness, seizures and loss of consciousness.    Blood pressure 140/85, pulse 75, temperature 97.8 F (36.6 C), temperature source Oral, resp. rate 26, SpO2 98.00%. Physical Exam  Constitutional: She is oriented to person, place, and time. She appears well-developed and well-nourished. She appears distressed.  HENT:  Head: Normocephalic and atraumatic.  Eyes: No scleral icterus.  Neck: Normal range of motion. Neck supple.  Cardiovascular: Normal rate, regular rhythm, normal heart sounds and intact distal pulses.  Exam reveals no gallop and no friction rub.   No murmur heard. Respiratory: Effort normal and breath sounds normal. No respiratory distress. She has no wheezes. She has no rales. She exhibits no tenderness.  GI: Soft. Bowel sounds are  normal. She exhibits no distension and no mass. There is tenderness. There is no rebound and no guarding.  TTP RUQ, epigastric region  Musculoskeletal: She exhibits no edema.  Lymphadenopathy:    She has no cervical adenopathy.  Neurological: She is alert and oriented to person, place, and time.  Skin: Skin is warm and dry. No rash noted. She is not diaphoretic. No erythema. No pallor.  Psychiatric: She has a normal mood and affect. Her behavior is normal. Judgment and thought content normal.     Assessment/Plan Acute Cholecystitis: proceed with laparoscopic cholecystectomy.  We discussed in detail surgery risks and complications including but not limited to infection, bleeding, death, MI, CBD leak.  The patient and the family verbalize understanding and wish to proceed with the surgery. Obtain consent.  Start zosyn, IVF, pain control, antiemetics.SCDs.  Post operatively, lovenox, IS, mobilize and advance diet as tolerated.     Quintasia Theroux ANP-BC  12/21/2012, 3:46 PM

## 2012-12-21 NOTE — Anesthesia Preprocedure Evaluation (Addendum)
Anesthesia Evaluation  Patient identified by MRN, date of birth, ID band Patient awake    Reviewed: Allergy & Precautions, H&P   Airway Mallampati: II TM Distance: <3 FB Neck ROM: Full    Dental   Pulmonary asthma ,  breath sounds clear to auscultation        Cardiovascular negative cardio ROS  Rhythm:Regular Rate:Normal     Neuro/Psych    GI/Hepatic negative GI ROS, Neg liver ROS, Gi history noted.   Endo/Other  negative endocrine ROS  Renal/GU negative Renal ROS     Musculoskeletal   Abdominal   Peds  Hematology negative hematology ROS (+)   Anesthesia Other Findings   Reproductive/Obstetrics                          Anesthesia Physical Anesthesia Plan  ASA: II and emergent  Anesthesia Plan: General   Post-op Pain Management:    Induction: Intravenous, Rapid sequence and Cricoid pressure planned  Airway Management Planned: Oral ETT  Additional Equipment:   Intra-op Plan:   Post-operative Plan: Possible Post-op intubation/ventilation  Informed Consent: I have reviewed the patients History and Physical, chart, labs and discussed the procedure including the risks, benefits and alternatives for the proposed anesthesia with the patient or authorized representative who has indicated his/her understanding and acceptance.   Dental advisory given  Plan Discussed with: CRNA and Surgeon  Anesthesia Plan Comments:       Anesthesia Quick Evaluation

## 2012-12-21 NOTE — H&P (Signed)
I have seen and examined the pt and agree with NP-Reibock's progress note. Acute cholecystitis on CT and [place] To OR for lap chole All risks and benefits were discussed with the patient to generally include: infection, bleeding, possible need for post op ERCP, damage to the bile ducts, and bile leak. Alternatives were offered and described.  All questions were answered and the patient voiced understanding of the procedure and wishes to proceed at this point with a laparoscopic cholecystectomy

## 2012-12-21 NOTE — ED Notes (Signed)
EDP notified pt is vomiting.  4mg  Zofran to be ordered.

## 2012-12-21 NOTE — Op Note (Signed)
Pre Operative Diagnosis: acute cholecystitis  Post Operative Diagnosis: same  Surgeon: Dr. Axel Filler   Procedure: lap chole  Assistant: GETA  Anesthesia: Gen. Endotracheal anesthesia   EBL: 15cc  Complications:  Counts: reported as correct x 2   Findings: The patient had acutely inflammed gallbladder with a large stone at teh neck of the gallbladder.  Indications for procedure: Pt is a 57 y/o F with epigastric pain and [place] and CT with acute cholecystitits.  She was counseled and taken to the OR urgently.  Details of the procedure:  The patient was taken to the operating and placed in the supine position with bilateral SCDs in place. A time out was called and all facts were verified. A pneumoperitoneum was obtained via A Veress needle technique to a pressure of 14mm of mercury. A 5mm trochar was then placed in the right upper quadrant under visualization, and there were no injuries to any abdominal organs. A 11 mm port was then placed in the umbilical region after infiltrating with local anesthesia under direct visualization. A second and third epigastric port and right lower quadrant port placement under direct visualization, respectively. The gallbladder was identified and retracted, the peritoneum was then sharply dissected from the gallbladder and this dissection was carried down to Calot's triangle. The gallbladder was identified and stripped away circumferentially and seen going into the gallbladder 360, the critical angle was obtained.  2 clips were placed proximally one distally and the cystic duct transected. The cystic artery was identified and 2 clips placed proximally and one distally and transected.  We then proceeded to remove the gallbladder off the hepatic fossa with Bovie cautery. A latex retrieval bag was then placed in the abdomen and gallbladder placed in the bag. The hepatic fossa was then reexamined and hemostasis was achieved with Bovie cautery and was excellent at  the end of the case. The subhepatic fossa and perihepatic fossa was then irrigated until the effluent was clear. The 11 mm trocar fascia was reapproximated with the Endo Close #1 Vicryl x .  The pneumoperitoneum was evacuated and all trochars removed under direct visulalization.  The skin was then closed with 4-0 Monocryl and the skin dressed with Steri-Strips, gauze, and tape.  The patient was awaken from general anesthesia and taken to the recovery room in stable condition.

## 2012-12-21 NOTE — Anesthesia Postprocedure Evaluation (Signed)
  Anesthesia Post-op Note  Patient: Tina [name]  Procedure(s) Performed: Procedure(s): LAPAROSCOPIC CHOLECYSTECTOMY (N/A)  Patient Location: PACU  Anesthesia Type:General  Level of Consciousness: awake, alert , oriented and patient cooperative  Airway and Oxygen Therapy: Patient Spontanous Breathing  Post-op Pain: mild  Post-op Assessment: Post-op Vital signs reviewed, Patient's Cardiovascular Status Stable, Respiratory Function Stable, Patent Airway, No signs of Nausea or vomiting, Adequate PO intake, Pain level controlled, No headache, No backache, No residual numbness and No residual motor weakness  Post-op Vital Signs: Reviewed and stable  Complications: No apparent anesthesia complications

## 2012-12-21 NOTE — ED Notes (Signed)
Pt. Awoke this morning with c/o sudden onset of abdominal pain.  Pt. Denies any fever n/v/d.

## 2012-12-21 NOTE — ED Notes (Addendum)
Patient's granddaughter back at bedside to assist in translation. Emina from [facility] paged to update that translator is again available

## 2012-12-21 NOTE — ED Notes (Signed)
Emina NP for [facility] at bedside. Pt having episode of emesis.

## 2012-12-22 MED ORDER — PANTOPRAZOLE SODIUM 40 MG PO TBEC: 40.0000 mg | DELAYED_RELEASE_TABLET | Freq: Every day | ORAL | Status: DC

## 2012-12-22 MED ORDER — HYDROCODONE-ACETAMINOPHEN 5-325 MG PO TABS: 1.0000 | ORAL_TABLET | Freq: Four times a day (QID) | ORAL | Status: DC | PRN

## 2012-12-22 NOTE — Progress Notes (Signed)
Discharge home.Home discharge instruction given, interpreter at bedside during the discharge process, no question verbalized.

## 2012-12-22 NOTE — Discharge Summary (Signed)
Physician Discharge Summary  Patient ID: Tina [name] MRN: [id number] DOB/AGE: 07/13/1955 57 y.o.  Admit date: 12/21/2012 Discharge date: 12/22/2012  Admitting Diagnosis: Acute cholecystitis  Discharge Diagnosis Patient Active Problem List   Diagnosis Date Noted  . Cholecystitis, acute 12/21/2012    Consultants None  Imaging: US Abdomen Complete  12/21/2012   CLINICAL DATA:  Abdominal pain  EXAM: ULTRASOUND ABDOMEN COMPLETE  COMPARISON:  Abdominal ultrasound October 19, 2006 and abdominal CT December 21, 2012  FINDINGS: Gallbladder  Gallbladder is visualized in multiple projections. Within the gallbladder, there are multiple echogenic foci which move and shadow consistent with gallstones. There is also gallbladder sludge. There is gallbladder wall thickening with gallbladder wall thickness measuring 5 mm. There is slight edema in the gallbladder wall.  Common bile duct  Diameter: 4 mm. There is no demonstrable intrahepatic, common hepatic, or common bile duct dilatation.  Liver  No focal lesion identified. Within normal limits in parenchymal echogenicity.  IVC  No abnormality visualized.  Pancreas  No pancreatic mass or inflammatory focus appreciated.  Spleen  Size and appearance within normal limits.  Right Kidney  Length: 9.4 cm. Echogenicity within normal limits. No mass or hydronephrosis visualized.  Left Kidney  Length: 10.4 cm. Echogenicity within normal limits. No mass or hydronephrosis visualized.  Abdominal aorta  No aneurysm visualized.  There is no appreciable ascites.  IMPRESSION: Findings consistent with acute cholecystitis.   Electronically Signed   By: Bretta Bang M.D.   On: 12/21/2012 14:42   Ct Abdomen Pelvis W Contrast  12/21/2012   CLINICAL DATA:  Severe bilateral upper quadrant pain radiating to the upper back with nausea and vomiting.  EXAM: CT ABDOMEN AND PELVIS WITH CONTRAST  TECHNIQUE: Multidetector CT imaging of the abdomen and pelvis was performed using the  standard protocol following bolus administration of intravenous contrast.  CONTRAST:  70mL OMNIPAQUE IOHEXOL 300 MG/ML  SOLN  COMPARISON:  11/24/2007.  FINDINGS: Lung bases show mild atelectasis or scarring dependently. Heart is mildly enlarged. No pericardial or pleural effusion.  Liver is unremarkable. A large stone is seen in the gallbladder, measuring 2.3 x 2.8 cm. Smaller stone debris in the gallbladder fundus. Mild haziness around the gallbladder.  Adrenal glands, kidneys, spleen, pancreas, stomach, small bowel, colon and appendix are unremarkable. Uterus and ovaries are visualized. Slight haziness nodularity at the root of the small bowel mesentery, unchanged. No pathologically enlarged lymph nodes. No free fluid. No worrisome lytic or sclerotic lesions.  IMPRESSION: Cholelithiasis with possible acute cholecystitis. Ultrasound would likely prove helpful in further evaluation, as clinically indicated.   Electronically Signed   By: Leanna Battles M.D.   On: 12/21/2012 12:06    Procedures Dr. Derrell Lolling (12/21/12) - Laparoscopic Cholecystectomy   Hospital Course:  Tina [name] is a healthy [place] 57 year old female who presented to [facility] with abdominal pain, nausea and vomiting. Duration of symptoms is 1 day. Onset was sudden. Coarse is unchanged. Moderate in severity, now 6/10. Location of pain is epigastric region. Characterized as sharp severe pain which radiates to the back. Associated with nausea and vomiting. She reports previous symptoms, however, unable to characterize. She denies fever, chills or sweats. Denies diarrhea or constipation. No modifying factors. She reports eating rice for dinner last night, the pain began at 2AM. No history of crohn's, UC, ulcers or reflux. She does not take any medication. She lives with her husband. She speaks [place], history has been provided by her granddaughter.  Workup showed evidence of acute cholecystitis  on [place] and CT scan with normal labwork.  Patient  was admitted and underwent procedure listed above.  Tolerated procedure well and was transferred to the floor.  Diet was advanced as tolerated.  On POD #1 the patient was voiding well, tolerating diet, ambulating well, pain well controlled, vital signs stable, incisions c/d/i and felt stable for discharge home.  Patient will follow up in our office in 3 weeks and knows to call with questions or concerns.  Physical Exam: General:  Alert, NAD, pleasant, comfortable Abd:  Soft, ND, mild tenderness, incisions C/D/I     Medication List         HYDROcodone-acetaminophen 5-325 MG per tablet  Commonly known as:  NORCO/VICODIN  Take 1-2 tablets by mouth every 6 (six) hours as needed (TAKE 1 TABLET FOR MILD TO MODERATE PAIN, TAKE 2 TABLETS FOR SEVERE PAIN).             Follow-up Information   Follow up with [facility] On 01/17/2013. (Your appointment is at 2:15pm, please arrive at least 30 minutes before your appointment to complete your check in paperwork.  If you are unable to arrive 30 minutes prior to your appointment time we may have to cancel or reschedule you.)    Contact information:   [address]   [place] [address] [phone]       Signed: Candiss Norse [facility] [phone]  12/22/2012, 8:46 AM

## 2012-12-23 ENCOUNTER — Encounter (HOSPITAL_COMMUNITY): Payer: Self-pay | Admitting: General Surgery

## 2013-01-17 ENCOUNTER — Ambulatory Visit (INDEPENDENT_AMBULATORY_CARE_PROVIDER_SITE_OTHER): Payer: Medicare Other | Admitting: General Surgery

## 2013-01-17 ENCOUNTER — Encounter (INDEPENDENT_AMBULATORY_CARE_PROVIDER_SITE_OTHER): Payer: Self-pay

## 2013-01-17 VITALS — BP 126/74 | HR 68 | Temp 98.0°F | Resp 18 | Ht 60.0 in | Wt 122.0 lb

## 2013-01-17 DIAGNOSIS — K810 Acute cholecystitis: Secondary | ICD-10-CM

## 2013-01-17 NOTE — Progress Notes (Signed)
Tina [name] 12-10-1955 [id number] 01/17/2013   Tina [name] is a 57 y.o. female who had a laparoscopic cholecystectomy with intraoperative cholangiogram by Dr. Dr. Axel Filler .   The pathology report confirmed Gallbladder - CHRONIC ACTIVE CHOLECYSTITIS. - CHOLESTEROLOSIS. - NEGATIVE FOR CHOLELITHIASIS.   The patient reports that they are feeling well with normal bowel movements and good appetite.  The pre-operative symptoms of abdominal pain, nausea, and vomiting have resolved.    Physical examination : BP 126/74  Pulse 68  Temp(Src) 98 F (36.7 C)  Resp 18  Ht 5' (1.524 m)  Wt 55.339 kg (122 lb)  BMI 23.83 kg/m2 - Incisions appear well-healed with no sign of infection or bleeding.   Abdomen - soft, non-tender  Impression:  s/p laparoscopic cholecystectomy  Plan:  She may resume a regular diet and full activity.  She may follow-up on a PRN basis.  Family was present as interpreter.

## 2013-01-17 NOTE — Patient Instructions (Signed)
Call if you have a problem. 

## 2013-03-25 DIAGNOSIS — R51 Headache: Secondary | ICD-10-CM | POA: Diagnosis not present

## 2013-03-25 DIAGNOSIS — J209 Acute bronchitis, unspecified: Secondary | ICD-10-CM | POA: Diagnosis not present

## 2013-03-25 DIAGNOSIS — G4700 Insomnia, unspecified: Secondary | ICD-10-CM | POA: Diagnosis not present

## 2013-03-27 DIAGNOSIS — E7800 Pure hypercholesterolemia, unspecified: Secondary | ICD-10-CM | POA: Diagnosis not present

## 2013-03-27 DIAGNOSIS — G4700 Insomnia, unspecified: Secondary | ICD-10-CM | POA: Diagnosis not present

## 2013-03-27 DIAGNOSIS — R51 Headache: Secondary | ICD-10-CM | POA: Diagnosis not present

## 2013-05-06 DIAGNOSIS — J45909 Unspecified asthma, uncomplicated: Secondary | ICD-10-CM | POA: Diagnosis not present

## 2013-05-06 DIAGNOSIS — J189 Pneumonia, unspecified organism: Secondary | ICD-10-CM | POA: Diagnosis not present

## 2013-05-06 DIAGNOSIS — R059 Cough, unspecified: Secondary | ICD-10-CM | POA: Diagnosis not present

## 2013-05-06 DIAGNOSIS — R05 Cough: Secondary | ICD-10-CM | POA: Diagnosis not present

## 2013-05-13 DIAGNOSIS — J45909 Unspecified asthma, uncomplicated: Secondary | ICD-10-CM | POA: Diagnosis not present

## 2013-05-13 DIAGNOSIS — R05 Cough: Secondary | ICD-10-CM | POA: Diagnosis not present

## 2013-05-13 DIAGNOSIS — J189 Pneumonia, unspecified organism: Secondary | ICD-10-CM | POA: Diagnosis not present

## 2013-05-13 DIAGNOSIS — R059 Cough, unspecified: Secondary | ICD-10-CM | POA: Diagnosis not present

## 2013-05-15 DIAGNOSIS — R059 Cough, unspecified: Secondary | ICD-10-CM | POA: Diagnosis not present

## 2013-05-15 DIAGNOSIS — R062 Wheezing: Secondary | ICD-10-CM | POA: Diagnosis not present

## 2013-07-15 DIAGNOSIS — J45909 Unspecified asthma, uncomplicated: Secondary | ICD-10-CM | POA: Diagnosis not present

## 2013-07-15 DIAGNOSIS — J209 Acute bronchitis, unspecified: Secondary | ICD-10-CM | POA: Diagnosis not present

## 2013-07-15 DIAGNOSIS — T7840XA Allergy, unspecified, initial encounter: Secondary | ICD-10-CM | POA: Diagnosis not present

## 2013-09-25 DIAGNOSIS — J209 Acute bronchitis, unspecified: Secondary | ICD-10-CM | POA: Diagnosis not present

## 2013-09-25 DIAGNOSIS — G4700 Insomnia, unspecified: Secondary | ICD-10-CM | POA: Diagnosis not present

## 2014-02-22 ENCOUNTER — Ambulatory Visit (HOSPITAL_BASED_OUTPATIENT_CLINIC_OR_DEPARTMENT_OTHER): Payer: Medicare Other | Admitting: *Deleted

## 2014-02-22 ENCOUNTER — Ambulatory Visit: Payer: Medicare Other | Attending: Internal Medicine | Admitting: Internal Medicine

## 2014-02-22 ENCOUNTER — Encounter: Payer: Self-pay | Admitting: Internal Medicine

## 2014-02-22 VITALS — BP 130/74 | HR 74 | Temp 97.5°F | Resp 14 | Ht <= 58 in | Wt 128.4 lb

## 2014-02-22 DIAGNOSIS — J45909 Unspecified asthma, uncomplicated: Secondary | ICD-10-CM | POA: Diagnosis not present

## 2014-02-22 DIAGNOSIS — Z139 Encounter for screening, unspecified: Secondary | ICD-10-CM

## 2014-02-22 DIAGNOSIS — R05 Cough: Secondary | ICD-10-CM

## 2014-02-22 DIAGNOSIS — R51 Headache: Secondary | ICD-10-CM | POA: Diagnosis not present

## 2014-02-22 DIAGNOSIS — R062 Wheezing: Secondary | ICD-10-CM | POA: Diagnosis not present

## 2014-02-22 DIAGNOSIS — Z23 Encounter for immunization: Secondary | ICD-10-CM

## 2014-02-22 DIAGNOSIS — J0101 Acute recurrent maxillary sinusitis: Secondary | ICD-10-CM | POA: Insufficient documentation

## 2014-02-22 DIAGNOSIS — Z7951 Long term (current) use of inhaled steroids: Secondary | ICD-10-CM | POA: Insufficient documentation

## 2014-02-22 DIAGNOSIS — R059 Cough, unspecified: Secondary | ICD-10-CM

## 2014-02-22 DIAGNOSIS — R519 Headache, unspecified: Secondary | ICD-10-CM

## 2014-02-22 DIAGNOSIS — J4540 Moderate persistent asthma, uncomplicated: Secondary | ICD-10-CM | POA: Insufficient documentation

## 2014-02-22 DIAGNOSIS — J0100 Acute maxillary sinusitis, unspecified: Secondary | ICD-10-CM

## 2014-02-22 DIAGNOSIS — G8929 Other chronic pain: Secondary | ICD-10-CM | POA: Insufficient documentation

## 2014-02-22 DIAGNOSIS — Z1231 Encounter for screening mammogram for malignant neoplasm of breast: Secondary | ICD-10-CM | POA: Diagnosis not present

## 2014-02-22 LAB — COMPLETE METABOLIC PANEL WITH GFR
ALT: 26 U/L (ref 0–35)
AST: 17 U/L (ref 0–37)
Albumin: 4.3 g/dL (ref 3.5–5.2)
Alkaline Phosphatase: 74 U/L (ref 39–117)
BUN: 11 mg/dL (ref 6–23)
CALCIUM: 9.3 mg/dL (ref 8.4–10.5)
CHLORIDE: 105 meq/L (ref 96–112)
CO2: 24 meq/L (ref 19–32)
Creat: 0.88 mg/dL (ref 0.50–1.10)
GFR, EST AFRICAN AMERICAN: 83 mL/min
GFR, EST NON AFRICAN AMERICAN: 72 mL/min
GLUCOSE: 102 mg/dL — AB (ref 70–99)
Potassium: 4.4 mEq/L (ref 3.5–5.3)
SODIUM: 141 meq/L (ref 135–145)
TOTAL PROTEIN: 7.2 g/dL (ref 6.0–8.3)
Total Bilirubin: 0.5 mg/dL (ref 0.2–1.2)

## 2014-02-22 LAB — CBC WITH DIFFERENTIAL/PLATELET
Basophils Absolute: 0 10*3/uL (ref 0.0–0.1)
Basophils Relative: 0 % (ref 0–1)
EOS PCT: 16 % — AB (ref 0–5)
Eosinophils Absolute: 1.7 10*3/uL — ABNORMAL HIGH (ref 0.0–0.7)
HCT: 43.2 % (ref 36.0–46.0)
HEMOGLOBIN: 14.5 g/dL (ref 12.0–15.0)
LYMPHS ABS: 3.7 10*3/uL (ref 0.7–4.0)
LYMPHS PCT: 35 % (ref 12–46)
MCH: 25.7 pg — AB (ref 26.0–34.0)
MCHC: 33.6 g/dL (ref 30.0–36.0)
MCV: 76.5 fL — ABNORMAL LOW (ref 78.0–100.0)
MONOS PCT: 4 % (ref 3–12)
MPV: 11.6 fL (ref 9.4–12.4)
Monocytes Absolute: 0.4 10*3/uL (ref 0.1–1.0)
Neutro Abs: 4.8 10*3/uL (ref 1.7–7.7)
Neutrophils Relative %: 45 % (ref 43–77)
Platelets: 248 10*3/uL (ref 150–400)
RBC: 5.65 MIL/uL — AB (ref 3.87–5.11)
RDW: 14.4 % (ref 11.5–15.5)
WBC: 10.7 10*3/uL — ABNORMAL HIGH (ref 4.0–10.5)

## 2014-02-22 MED ORDER — ALBUTEROL SULFATE HFA 108 (90 BASE) MCG/ACT IN AERS: 2.0000 | INHALATION_SPRAY | Freq: Four times a day (QID) | RESPIRATORY_TRACT | Status: DC | PRN

## 2014-02-22 MED ORDER — BUTALBITAL-APAP-CAFFEINE 50-325-40 MG PO TABS: 1.0000 | ORAL_TABLET | Freq: Four times a day (QID) | ORAL | Status: DC | PRN

## 2014-02-22 MED ORDER — NAPROXEN 500 MG PO TABS: 500.0000 mg | ORAL_TABLET | Freq: Two times a day (BID) | ORAL | Status: DC

## 2014-02-22 MED ORDER — DM-GUAIFENESIN ER 30-600 MG PO TB12: 1.0000 | ORAL_TABLET | Freq: Two times a day (BID) | ORAL | Status: DC | PRN

## 2014-02-22 MED ORDER — AMOXICILLIN-POT CLAVULANATE 875-125 MG PO TABS
1.0000 | ORAL_TABLET | Freq: Two times a day (BID) | ORAL | Status: DC
Start: 2014-02-22 — End: 2015-04-04

## 2014-02-22 NOTE — Progress Notes (Signed)
Spoke with patient via Pharmacologist, [place] Patient presents to establish care C/o 2 week history headaches and left-sided neck pain.  States it feels like something is stuck and has difficulty swallowing

## 2014-02-22 NOTE — Progress Notes (Signed)
Patient Demographics  Tina [name], is a 58 y.o. female  OHY:[id number]  RSW:[id number]  DOB - 03-05-1956  CC:  Chief Complaint  Patient presents with  . Neck Pain    left side  . Migraine       HPI: Tina [name] is a 58 y.o. female here today to establish medical care. Patient has history of asthma and is using only Dulera 2 puffs twice a day, recently has been having nasal congestion coughing sore throat chills headache wheezing for the last few days, as per patient her headache is on and off for the last few years ?she has tried over-the-counter medication without much improvement, has occasional cough and bring minimal sputum, denies any nausea vomiting change in bowel habits.   No Known Allergies Past Medical History  Diagnosis Date  . Asthma   . Nephrolithiasis     "came out on it's own" (12/21/2012)   Current Outpatient Prescriptions on File Prior to Visit  Medication Sig Dispense Refill  . HYDROcodone-acetaminophen (NORCO/VICODIN) 5-325 MG per tablet Take 1-2 tablets by mouth every 6 (six) hours as needed (TAKE 1 TABLET FOR MILD TO MODERATE PAIN, TAKE 2 TABLETS FOR SEVERE PAIN). (Patient not taking: Reported on 02/22/2014) 40 tablet 0   No current facility-administered medications on file prior to visit.   History reviewed. No pertinent family history. History   Social History  . Marital Status: Married    Spouse Name: N/A    Number of Children: N/A  . Years of Education: N/A   Occupational History  . Not on file.   Social History Main Topics  . Smoking status: Never Smoker   . Smokeless tobacco: Never Used  . Alcohol Use: No  . Drug Use: No  . Sexual Activity: Yes   Other Topics Concern  . Not on file   Social History Narrative    Review of Systems: Constitutional: Negative for fever, chills, diaphoresis, activity change, appetite change and fatigue. HENT: Negative for ear pain, nosebleeds, congestion, facial swelling, rhinorrhea, neck pain, neck  stiffness and ear discharge.  Eyes: Negative for pain, discharge, redness, itching and visual disturbance. Respiratory: Negative for cough, choking, chest tightness, shortness of breath, wheezing and stridor.  Cardiovascular: Negative for chest pain, palpitations and leg swelling. Gastrointestinal: Negative for abdominal distention. Genitourinary: Negative for dysuria, urgency, frequency, hematuria, flank pain, decreased urine volume, difficulty urinating and dyspareunia.  Musculoskeletal: Negative for back pain, joint swelling, arthralgia and gait problem. Neurological: Negative for dizziness, tremors, seizures, syncope, facial asymmetry, speech difficulty, weakness, light-headedness, numbness and headaches.  Hematological: Negative for adenopathy. Does not bruise/bleed easily. Psychiatric/Behavioral: Negative for hallucinations, behavioral problems, confusion, dysphoric mood, decreased concentration and agitation.    Objective:   Filed Vitals:   02/22/14 0937  BP: 130/74  Pulse: 74  Temp: 97.5 F (36.4 C)  Resp: 14    Physical Exam: Constitutional: Patient appears well-developed and well-nourished. No distress. HENT: Normocephalic, atraumatic, External right and left ear normal. Oropharynx is clear and moist. Nasal congestion some sinus tenderness Eyes: Conjunctivae and EOM are normal. PERRLA, no scleral icterus. Neck: Normal ROM. Neck supple. No JVD. No tracheal deviation. No thyromegaly. CVS: RRR, S1/S2 +, no murmurs, no gallops, no carotid bruit.  Pulmonary: Effort and breath sounds normal, no stridor, rhonchi, +wheezes, rales.  Abdominal: Soft. BS +, no distension, tenderness, rebound or guarding.  Musculoskeletal: Normal range of motion. No edema and no tenderness.  Lymphadenopathy: No lymphadenopathy noted, +cervical, inguinal or axillary Neuro: Alert.  Normal reflexes, muscle tone coordination. No cranial nerve deficit. Skin: Skin is warm and dry. No rash noted. Not  diaphoretic. No erythema. No pallor. Psychiatric: Normal mood and affect. Behavior, judgment, thought content normal.  Lab Results  Component Value Date   WBC 7.3 12/21/2012   HGB 13.3 12/21/2012   HCT 38.6 12/21/2012   MCV 75.0* 12/21/2012   PLT 189 12/21/2012   Lab Results  Component Value Date   CREATININE 0.73 12/21/2012   BUN 11 12/21/2012   NA 145 12/21/2012   K 3.5 12/21/2012   CL 113* 12/21/2012   CO2 23 12/21/2012    No results found for: HGBA1C Lipid Panel  No results found for: CHOL, TRIG, HDL, CHOLHDL, VLDL, LDLCALC     Assessment and plan:   1. Asthma, unspecified asthma severity, uncomplicated I prescribed patient albuterol to use when necessary for wheezing - albuterol (PROVENTIL HFA;VENTOLIN HFA) 108 (90 BASE) MCG/ACT inhaler; Inhale 2 puffs into the lungs every 6 (six) hours as needed for wheezing or shortness of breath.  Dispense: 1 Inhaler; Refill: 3 - CBC with Differential - COMPLETE METABOLIC PANEL WITH GFR  2. Acute maxillary sinusitis, recurrence not specified  - amoxicillin-clavulanate (AUGMENTIN) 875-125 MG per tablet; Take 1 tablet by mouth 2 (two) times daily.  Dispense: 20 tablet; Refill: 0 , also advise patient for saltwater gargles.  4. Nonintractable headache, unspecified chronicity pattern, unspecified headache type  - butalbital-acetaminophen-caffeine (ESGIC) 50-325-40 MG per tablet; Take 1 tablet by mouth every 6 (six) hours as needed for headache.  Dispense: 30 tablet; Refill: 0 - naproxen (NAPROSYN) 500 MG tablet; Take 1 tablet (500 mg total) by mouth 2 (two) times daily with a meal.  Dispense: 30 tablet; Refill: 2  5. Cough  - dextromethorphan-guaiFENesin (MUCINEX DM) 30-600 MG per 12 hr tablet; Take 1 tablet by mouth 2 (two) times daily as needed for cough.  Dispense: 60 tablet; Refill: 1  6. Wheezing  - albuterol (PROVENTIL HFA;VENTOLIN HFA) 108 (90 BASE) MCG/ACT inhaler; Inhale 2 puffs into the lungs every 6 (six) hours as  needed for wheezing or shortness of breath.  Dispense: 1 Inhaler; Refill: 3  7. Encounter for screening mammogram for breast cancer  - MM DIGITAL SCREENING BILATERAL; Future  8. Screening  - Ambulatory referral to Gynecology    Health Maintenance  -Pap Smear: referred to GYN -Mammogram: ordered  -Vaccinations:  Flu shot today   Return in about 3 months (around 05/24/2014) for asthma.   Lorayne Marek, MD

## 2014-05-25 ENCOUNTER — Ambulatory Visit: Payer: Medicare Other | Attending: Internal Medicine | Admitting: Internal Medicine

## 2014-05-25 ENCOUNTER — Encounter: Payer: Self-pay | Admitting: Internal Medicine

## 2014-05-25 VITALS — BP 127/73 | HR 73 | Temp 97.8°F | Resp 16 | Wt 132.0 lb

## 2014-05-25 DIAGNOSIS — Z23 Encounter for immunization: Secondary | ICD-10-CM

## 2014-05-25 DIAGNOSIS — K5900 Constipation, unspecified: Secondary | ICD-10-CM | POA: Insufficient documentation

## 2014-05-25 DIAGNOSIS — Z79899 Other long term (current) drug therapy: Secondary | ICD-10-CM | POA: Diagnosis not present

## 2014-05-25 DIAGNOSIS — R0981 Nasal congestion: Secondary | ICD-10-CM | POA: Diagnosis not present

## 2014-05-25 DIAGNOSIS — R062 Wheezing: Secondary | ICD-10-CM

## 2014-05-25 DIAGNOSIS — J45909 Unspecified asthma, uncomplicated: Secondary | ICD-10-CM | POA: Insufficient documentation

## 2014-05-25 MED ORDER — FLUTICASONE-SALMETEROL 100-50 MCG/DOSE IN AEPB: 1.0000 | INHALATION_SPRAY | Freq: Two times a day (BID) | RESPIRATORY_TRACT | Status: DC

## 2014-05-25 MED ORDER — MAGNESIUM HYDROXIDE 400 MG/5ML PO SUSP: 15.0000 mL | Freq: Every day | ORAL | Status: DC | PRN

## 2014-05-25 MED ORDER — FLUTICASONE PROPIONATE 50 MCG/ACT NA SUSP: 2.0000 | Freq: Every day | NASAL | Status: DC

## 2014-05-25 MED ORDER — ALBUTEROL SULFATE HFA 108 (90 BASE) MCG/ACT IN AERS: 2.0000 | INHALATION_SPRAY | Freq: Four times a day (QID) | RESPIRATORY_TRACT | Status: DC | PRN

## 2014-05-25 NOTE — Progress Notes (Signed)
Patient here with interpreter Complains of feeling constipated Having some swelling in her neck area States her albuterol inhaler is not working and would advair

## 2014-05-25 NOTE — Patient Instructions (Signed)

## 2014-05-25 NOTE — Progress Notes (Signed)
MRN: [id number] Name: Tina [name]  Sex: female Age: 59 y.o. DOB: 08/05/1955  Allergies: Review of patient's allergies indicates no known allergies.  Chief Complaint  Patient presents with  . Follow-up    HPI: Patient is 59 y.o. 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  Past Medical History  Diagnosis Date  . Asthma   . Nephrolithiasis     "came out on it's own" (12/21/2012)    Past Surgical History  Procedure Laterality Date  . Kidney stone surgery      spouse denies this hx on 12/21/2012  . Laparoscopic cholecystectomy  12/21/2012  . Cholecystectomy N/A 12/21/2012    Procedure: LAPAROSCOPIC CHOLECYSTECTOMY;  Surgeon: Ralene Ok, MD;  Location: [facility];  Service: General;  Laterality: N/A;      Medication List       This list is accurate as of: 05/25/14  3:32 PM.  Always use your most recent med list.               albuterol 108 (90 BASE) MCG/ACT inhaler  Commonly known as:  PROVENTIL HFA;VENTOLIN HFA  Inhale 2 puffs into the lungs every 6 (six) hours as needed for wheezing or shortness of breath.     amoxicillin-clavulanate 875-125 MG per tablet  Commonly known as:  AUGMENTIN  Take 1 tablet by mouth 2 (two) times daily.     butalbital-acetaminophen-caffeine 50-325-40 MG per tablet  Commonly known as:  ESGIC  Take 1 tablet by mouth every 6 (six) hours as needed for headache.     dextromethorphan-guaiFENesin  30-600 MG per 12 hr tablet  Commonly known as:  MUCINEX DM  Take 1 tablet by mouth 2 (two) times daily as needed for cough.     fluticasone 50 MCG/ACT nasal spray  Commonly known as:  FLONASE  Place 2 sprays into both nostrils daily.     Fluticasone-Salmeterol 100-50 MCG/DOSE Aepb  Commonly known as:  ADVAIR  Inhale 1 puff into the lungs 2 (two) times daily.     HYDROcodone-acetaminophen 5-325 MG per tablet  Commonly known as:  NORCO/VICODIN  Take 1-2 tablets by mouth every 6 (six) hours as needed (TAKE 1 TABLET FOR MILD TO MODERATE PAIN, TAKE 2 TABLETS FOR SEVERE PAIN).     magnesium hydroxide 400 MG/5ML suspension  Commonly known as:  MILK OF MAGNESIA  Take 15 mLs by mouth daily as needed for mild constipation.     mometasone-formoterol 100-5 MCG/ACT Aero  Commonly known as:  DULERA  Inhale 2 puffs into the lungs 2 (two) times daily.     naproxen 500 MG tablet  Commonly known as:  NAPROSYN  Take 1 tablet (500 mg total) by mouth 2 (two) times daily with a meal.        Meds ordered this encounter  Medications  . Fluticasone-Salmeterol (ADVAIR) 100-50 MCG/DOSE AEPB    Sig: Inhale 1 puff into the lungs 2 (two) times daily.    Dispense:  1 each    Refill:  3  . albuterol (PROVENTIL HFA;VENTOLIN HFA) 108 (90 BASE) MCG/ACT inhaler    Sig: Inhale 2 puffs into the lungs every 6 (six) hours as needed for wheezing or shortness of breath.    Dispense:  1 Inhaler    Refill:  3  . magnesium hydroxide (MILK OF MAGNESIA) 400 MG/5ML suspension    Sig: Take 15 mLs by mouth daily as needed for mild constipation.    Dispense:  360 mL    Refill:  0  . fluticasone (FLONASE) 50 MCG/ACT nasal spray    Sig: Place 2 sprays into both nostrils daily.    Dispense:  16 g    Refill:  3    Immunization History  Administered Date(s) Administered  . Influenza,inj,Quad PF,36+ Mos 12/22/2012, 02/22/2014    History reviewed. No pertinent family history.  History  Substance Use Topics  .  Smoking status: Never Smoker   . Smokeless tobacco: Never Used  . Alcohol Use: No    Review of Systems   As noted in HPI  Filed Vitals:   05/25/14 1501  BP: 127/73  Pulse: 73  Temp: 97.8 F (36.6 C)  Resp: 16    Physical Exam  Physical Exam  Constitutional: No distress.  HENT:  Presentation no sinus tenderness Minimal pharyngeal erythema , no thrush noted  Eyes: EOM are normal. Pupils are equal, round, and reactive to light.  Neck: Neck supple.  Cardiovascular: Normal rate and regular rhythm.   Pulmonary/Chest: Breath sounds normal. No respiratory distress. She has no wheezes. She has no rales.  Abdominal: Soft. She exhibits no distension. There is no tenderness. There is no rebound.  Musculoskeletal: She exhibits no edema.    CBC    Component Value Date/Time   WBC 10.7* 02/22/2014 1013   RBC 5.65* 02/22/2014 1013   HGB 14.5 02/22/2014 1013   HCT 43.2 02/22/2014 1013   PLT 248 02/22/2014 1013   MCV 76.5* 02/22/2014 1013   LYMPHSABS 3.7 02/22/2014 1013   MONOABS 0.4 02/22/2014 1013   EOSABS 1.7* 02/22/2014 1013   BASOSABS 0.0 02/22/2014 1013    CMP     Component Value Date/Time   NA 141 02/22/2014 1013   K 4.4 02/22/2014 1013   CL 105 02/22/2014 1013   CO2 24 02/22/2014 1013   GLUCOSE 102* 02/22/2014 1013   BUN 11 02/22/2014 1013   CREATININE 0.88 02/22/2014 1013   CREATININE 0.73 12/21/2012 0850   CALCIUM 9.3 02/22/2014 1013   PROT 7.2 02/22/2014 1013   ALBUMIN 4.3 02/22/2014 1013   AST 17 02/22/2014 1013   ALT 26 02/22/2014 1013   ALKPHOS 74 02/22/2014 1013   BILITOT 0.5 02/22/2014 1013   GFRNONAA 72 02/22/2014 1013   GFRNONAA >90 12/21/2012 0850   GFRAA 83 02/22/2014 1013   GFRAA >90 12/21/2012 0850    No results found for: CHOL  No components found for: HGA1C  Lab Results  Component Value Date/Time   AST 17 02/22/2014 10:13 AM    Assessment and Plan  Asthma, unspecified asthma severity, uncomplicated - Plan:I have started patient  on Advair and prescribed albuterol to use when necessary, counseled to do rinse the mouth and do gargles after using Advair. Fluticasone-Salmeterol (ADVAIR) 100-50 MCG/DOSE AEPB, albuterol (PROVENTIL HFA;VENTOLIN HFA) 108 (90 BASE) MCG/ACT inhaler  Constipation, unspecified constipation type - Plan: advised patient for high-fiber diet,magnesium hydroxide (MILK OF MAGNESIA) 400 MG/5ML suspension  Wheezing - Plan: albuterol (PROVENTIL HFA;VENTOLIN HFA) 108 (  90 BASE) MCG/ACT inhaler,   Nasal congestion fluticasone (FLONASE) 50 MCG/ACT nasal spray  Need for prophylactic vaccination against Streptococcus pneumoniae (pneumococcus) - Plan: Pneumococcal polysaccharide vaccine 23-valent greater than or equal to 2yo subcutaneous/IM   Health Maintenance  -Vaccinations: Up-to-date with flu shot, Pneumovax given today.  -Influenza  Return in about 3 months (around 08/25/2014) for asthma.   This note has been created with Surveyor, quantity. Any transcriptional errors are unintentional.    Lorayne Marek, MD

## 2014-08-13 ENCOUNTER — Encounter: Payer: Self-pay | Admitting: Internal Medicine

## 2014-08-13 ENCOUNTER — Ambulatory Visit: Payer: Medicare Other | Attending: Internal Medicine | Admitting: Internal Medicine

## 2014-08-13 VITALS — BP 127/77 | HR 71 | Temp 98.0°F | Resp 16 | Wt 130.8 lb

## 2014-08-13 DIAGNOSIS — Z792 Long term (current) use of antibiotics: Secondary | ICD-10-CM | POA: Diagnosis not present

## 2014-08-13 DIAGNOSIS — J351 Hypertrophy of tonsils: Secondary | ICD-10-CM | POA: Diagnosis not present

## 2014-08-13 DIAGNOSIS — Z9049 Acquired absence of other specified parts of digestive tract: Secondary | ICD-10-CM | POA: Insufficient documentation

## 2014-08-13 DIAGNOSIS — J45909 Unspecified asthma, uncomplicated: Secondary | ICD-10-CM | POA: Insufficient documentation

## 2014-08-13 DIAGNOSIS — R062 Wheezing: Secondary | ICD-10-CM | POA: Diagnosis not present

## 2014-08-13 DIAGNOSIS — Z7951 Long term (current) use of inhaled steroids: Secondary | ICD-10-CM | POA: Diagnosis not present

## 2014-08-13 DIAGNOSIS — Z791 Long term (current) use of non-steroidal anti-inflammatories (NSAID): Secondary | ICD-10-CM | POA: Diagnosis not present

## 2014-08-13 DIAGNOSIS — K5900 Constipation, unspecified: Secondary | ICD-10-CM | POA: Insufficient documentation

## 2014-08-13 LAB — COMPLETE METABOLIC PANEL WITH GFR
ALBUMIN: 4.2 g/dL (ref 3.5–5.2)
ALK PHOS: 63 U/L (ref 39–117)
ALT: 37 U/L — ABNORMAL HIGH (ref 0–35)
AST: 23 U/L (ref 0–37)
BILIRUBIN TOTAL: 0.3 mg/dL (ref 0.2–1.2)
BUN: 12 mg/dL (ref 6–23)
CHLORIDE: 107 meq/L (ref 96–112)
CO2: 23 meq/L (ref 19–32)
Calcium: 9.4 mg/dL (ref 8.4–10.5)
Creat: 0.78 mg/dL (ref 0.50–1.10)
GFR, Est African American: >89 mL/min
GFR, Est Non African American: 83 mL/min
Glucose, Bld: 96 mg/dL (ref 70–99)
Potassium: 4.1 mEq/L (ref 3.5–5.3)
Sodium: 141 mEq/L (ref 135–145)
TOTAL PROTEIN: 6.9 g/dL (ref 6.0–8.3)

## 2014-08-13 MED ORDER — FLUTICASONE-SALMETEROL 100-50 MCG/DOSE IN AEPB: 1.0000 | INHALATION_SPRAY | Freq: Two times a day (BID) | RESPIRATORY_TRACT | Status: DC

## 2014-08-13 MED ORDER — AZITHROMYCIN 250 MG PO TABS: ORAL_TABLET | ORAL | Status: DC

## 2014-08-13 MED ORDER — ALBUTEROL SULFATE HFA 108 (90 BASE) MCG/ACT IN AERS: 2.0000 | INHALATION_SPRAY | Freq: Four times a day (QID) | RESPIRATORY_TRACT | Status: DC | PRN

## 2014-08-13 MED ORDER — LACTULOSE 10 GM/15ML PO SOLN: 20.0000 g | Freq: Every day | ORAL | Status: DC | PRN

## 2014-08-13 NOTE — Progress Notes (Signed)
Patient here with interpreter Patient complains of having a lump to the left side of her throat Patient also complains of constipation as well as her asthma is bothering her

## 2014-08-13 NOTE — Progress Notes (Signed)
MRN: [id number] Name: Tina [name]  Sex: female Age: 59 y.o. DOB: Mar 10, 1955  Allergies: Review of patient's allergies indicates no known allergies.  Chief Complaint  Patient presents with  . Follow-up    HPI: Patient is 59 y.o. female who has history of asthma, constipation comes today for followup , she still reports having problem with bowel movements denies any bleeding, she has tried milk of magnesia which he was prescribed on the last visit which as per patient is not helping, denies any nausea vomiting fever chills chest and shortness of breath,, she is also complaining of having some lump in the neck denies any sore throat denies any nasal congestion or sinus headache, she brought the inhalers apparently she is history using Dulera and Advair, again counseled patient to only continue with Advair, use albuterol is when necessary, she needs refill on the meds.  Past Medical History  Diagnosis Date  . Asthma   . Nephrolithiasis     "came out on it's own" (12/21/2012)    Past Surgical History  Procedure Laterality Date  . Kidney stone surgery      spouse denies this hx on 12/21/2012  . Laparoscopic cholecystectomy  12/21/2012  . Cholecystectomy N/A 12/21/2012    Procedure: LAPAROSCOPIC CHOLECYSTECTOMY;  Surgeon: Ralene Ok, MD;  Location: [place];  Service: General;  Laterality: N/A;      Medication List       This list is accurate as of: 08/13/14 10:32 AM.  Always use your most recent med list.               albuterol 108 (90 BASE) MCG/ACT inhaler  Commonly known as:  PROVENTIL HFA;VENTOLIN HFA  Inhale 2 puffs into the lungs every 6 (six) hours as needed for wheezing or shortness of breath.     amoxicillin-clavulanate 875-125 MG per tablet  Commonly known as:  AUGMENTIN  Take 1 tablet by mouth 2 (two) times daily.     azithromycin 250 MG tablet  Commonly known as:  ZITHROMAX Z-PAK  Take as directed     butalbital-acetaminophen-caffeine 50-325-40 MG per  tablet  Commonly known as:  ESGIC  Take 1 tablet by mouth every 6 (six) hours as needed for headache.     dextromethorphan-guaiFENesin 30-600 MG per 12 hr tablet  Commonly known as:  MUCINEX DM  Take 1 tablet by mouth 2 (two) times daily as needed for cough.     fluticasone 50 MCG/ACT nasal spray  Commonly known as:  FLONASE  Place 2 sprays into both nostrils daily.     Fluticasone-Salmeterol 100-50 MCG/DOSE Aepb  Commonly known as:  ADVAIR  Inhale 1 puff into the lungs 2 (two) times daily.     HYDROcodone-acetaminophen 5-325 MG per tablet  Commonly known as:  NORCO/VICODIN  Take 1-2 tablets by mouth every 6 (six) hours as needed (TAKE 1 TABLET FOR MILD TO MODERATE PAIN, TAKE 2 TABLETS FOR SEVERE PAIN).     lactulose 10 GM/15ML solution  Commonly known as:  CHRONULAC  Take 30 mLs (20 g total) by mouth daily as needed for mild constipation.     naproxen 500 MG tablet  Commonly known as:  NAPROSYN  Take 1 tablet (500 mg total) by mouth 2 (two) times daily with a meal.        Meds ordered this encounter  Medications  . lactulose (CHRONULAC) 10 GM/15ML solution    Sig: Take 30 mLs (20 g total) by mouth daily as needed for  mild constipation.    Dispense:  240 mL    Refill:  1  . albuterol (PROVENTIL HFA;VENTOLIN HFA) 108 (90 BASE) MCG/ACT inhaler    Sig: Inhale 2 puffs into the lungs every 6 (six) hours as needed for wheezing or shortness of breath.    Dispense:  1 Inhaler    Refill:  4  . Fluticasone-Salmeterol (ADVAIR) 100-50 MCG/DOSE AEPB    Sig: Inhale 1 puff into the lungs 2 (two) times daily.    Dispense:  1 each    Refill:  4  . azithromycin (ZITHROMAX Z-PAK) 250 MG tablet    Sig: Take as directed    Dispense:  6 each    Refill:  0    Immunization History  Administered Date(s) Administered  . Influenza,inj,Quad PF,36+ Mos 12/22/2012, 02/22/2014  . Pneumococcal Polysaccharide-23 05/25/2014    History reviewed. No pertinent family history.  History    Substance Use Topics  . Smoking status: Never Smoker   . Smokeless tobacco: Never Used  . Alcohol Use: No    Review of Systems   As noted in HPI  Filed Vitals:   08/13/14 0906  BP: 127/77  Pulse: 71  Temp: 98 F (36.7 C)  Resp: 16    Physical Exam  Physical Exam  HENT:  Enlarged tonsils no exudate, no sinus congestion or tenderness  Eyes: EOM are normal. Pupils are equal, round, and reactive to light.  Cardiovascular: Normal rate and regular rhythm.   Pulmonary/Chest: Breath sounds normal. No respiratory distress. She has no wheezes. She has no rales.  Abdominal: Soft. There is no tenderness. There is no rebound.    CBC    Component Value Date/Time   WBC 10.7* 02/22/2014 1013   RBC 5.65* 02/22/2014 1013   HGB 14.5 02/22/2014 1013   HCT 43.2 02/22/2014 1013   PLT 248 02/22/2014 1013   MCV 76.5* 02/22/2014 1013   LYMPHSABS 3.7 02/22/2014 1013   MONOABS 0.4 02/22/2014 1013   EOSABS 1.7* 02/22/2014 1013   BASOSABS 0.0 02/22/2014 1013    CMP     Component Value Date/Time   NA 141 02/22/2014 1013   K 4.4 02/22/2014 1013   CL 105 02/22/2014 1013   CO2 24 02/22/2014 1013   GLUCOSE 102* 02/22/2014 1013   BUN 11 02/22/2014 1013   CREATININE 0.88 02/22/2014 1013   CREATININE 0.73 12/21/2012 0850   CALCIUM 9.3 02/22/2014 1013   PROT 7.2 02/22/2014 1013   ALBUMIN 4.3 02/22/2014 1013   AST 17 02/22/2014 1013   ALT 26 02/22/2014 1013   ALKPHOS 74 02/22/2014 1013   BILITOT 0.5 02/22/2014 1013   GFRNONAA 72 02/22/2014 1013   GFRNONAA >90 12/21/2012 0850   GFRAA 83 02/22/2014 1013   GFRAA >90 12/21/2012 0850    No results found for: CHOL  No results found for: HGBA1C  Lab Results  Component Value Date/Time   AST 17 02/22/2014 10:13 AM    Assessment and Plan  Asthma, unspecified asthma severity, uncomplicated - Plan: albuterol (PROVENTIL HFA;VENTOLIN HFA) 108 (90 BASE) MCG/ACT inhaler, Fluticasone-Salmeterol (ADVAIR) 100-50 MCG/DOSE AEPB  Wheezing -  Plan: albuterol (PROVENTIL HFA;VENTOLIN HFA) 108 (90 BASE) MCG/ACT inhaler  Enlarged tonsils - Plan: azithromycin (ZITHROMAX Z-PAK) 250 MG tablet  Constipation, unspecified constipation type - Plan:advised patient for high fiber diet, trial of lactulose lactulose (CHRONULAC) 10 GM/15ML solution, COMPLETE METABOLIC PANEL WITH GFR    Return in about 3 months (around 11/13/2014), or if symptoms worsen or fail to improve.  This note has been created with Surveyor, quantity. Any transcriptional errors are unintentional.    Lorayne Marek, MD

## 2014-11-13 ENCOUNTER — Telehealth: Payer: Self-pay | Admitting: Internal Medicine

## 2015-04-04 ENCOUNTER — Encounter: Payer: Self-pay | Admitting: Family Medicine

## 2015-04-04 ENCOUNTER — Ambulatory Visit: Payer: Medicare Other | Attending: Family Medicine | Admitting: Family Medicine

## 2015-04-04 VITALS — BP 129/74 | HR 71 | Temp 98.4°F | Resp 16 | Ht <= 58 in | Wt 130.0 lb

## 2015-04-04 DIAGNOSIS — J45909 Unspecified asthma, uncomplicated: Secondary | ICD-10-CM | POA: Diagnosis not present

## 2015-04-04 DIAGNOSIS — Z9049 Acquired absence of other specified parts of digestive tract: Secondary | ICD-10-CM | POA: Diagnosis not present

## 2015-04-04 DIAGNOSIS — Z79899 Other long term (current) drug therapy: Secondary | ICD-10-CM | POA: Diagnosis not present

## 2015-04-04 DIAGNOSIS — R51 Headache: Secondary | ICD-10-CM | POA: Diagnosis not present

## 2015-04-04 DIAGNOSIS — Z0000 Encounter for general adult medical examination without abnormal findings: Secondary | ICD-10-CM | POA: Diagnosis not present

## 2015-04-04 DIAGNOSIS — Z23 Encounter for immunization: Secondary | ICD-10-CM

## 2015-04-04 DIAGNOSIS — G8929 Other chronic pain: Secondary | ICD-10-CM

## 2015-04-04 LAB — POCT GLYCOSYLATED HEMOGLOBIN (HGB A1C): Hemoglobin A1C: 6.2

## 2015-04-04 MED ORDER — BUTALBITAL-APAP-CAFFEINE 50-325-40 MG PO TABS: 1.0000 | ORAL_TABLET | Freq: Four times a day (QID) | ORAL | Status: DC | PRN

## 2015-04-04 MED ORDER — ALBUTEROL SULFATE HFA 108 (90 BASE) MCG/ACT IN AERS: 8.0000 | INHALATION_SPRAY | Freq: Four times a day (QID) | RESPIRATORY_TRACT | Status: DC | PRN

## 2015-04-04 MED ORDER — ALBUTEROL SULFATE HFA 108 (90 BASE) MCG/ACT IN AERS: 2.0000 | INHALATION_SPRAY | Freq: Four times a day (QID) | RESPIRATORY_TRACT | Status: DC | PRN

## 2015-04-04 MED ORDER — FLUTICASONE-SALMETEROL 100-50 MCG/DOSE IN AEPB: 1.0000 | INHALATION_SPRAY | Freq: Two times a day (BID) | RESPIRATORY_TRACT | Status: DC

## 2015-04-04 MED ORDER — MOMETASONE FURO-FORMOTEROL FUM 100-5 MCG/ACT IN AERO: 2.0000 | INHALATION_SPRAY | Freq: Two times a day (BID) | RESPIRATORY_TRACT | Status: DC

## 2015-04-04 MED ORDER — FLUTICASONE PROPIONATE 50 MCG/ACT NA SUSP: 2.0000 | Freq: Every day | NASAL | Status: DC

## 2015-04-04 MED ORDER — BUTALBITAL-APAP-CAFFEINE 50-300-40 MG PO CAPS: 1.0000 | ORAL_CAPSULE | Freq: Three times a day (TID) | ORAL | Status: DC | PRN

## 2015-04-04 MED FILL — ADVAIR 100/50 DISKUS: 100-50 | 30 days supply | Qty: 60 | Fill #0

## 2015-04-04 MED FILL — BUTALB-ACETAMIN-CAFF 50-325: 50-325-40 | 8 days supply | Qty: 30 | Fill #0

## 2015-04-04 MED FILL — FLUTICASONE PROP 50 MCG SPR: 50 | 30 days supply | Qty: 16 | Fill #0

## 2015-04-04 MED FILL — PROAIR HFA 90 MCG INHALER: 108 (90 BAS | 25 days supply | Qty: 9 | Fill #0

## 2015-04-04 NOTE — Assessment & Plan Note (Signed)
Well controlled Refilled albuterol and controll

## 2015-04-04 NOTE — Assessment & Plan Note (Signed)
Chronic headache Suspect migraine fioricet refilled

## 2015-04-04 NOTE — Patient Instructions (Addendum)
Tina [name] was seen today for asthma.  Diagnoses and all orders for this visit:  Healthcare maintenance -     POCT glycosylated hemoglobin (Hb A1C) -     Flu Vaccine QUAD 36+ mos IM -     MM DIGITAL SCREENING BILATERAL; Future  Asthma, unspecified asthma severity, uncomplicated -     mometasone-formoterol (DULERA) 100-5 MCG/ACT AERO; Inhale 2 puffs into the lungs 2 (two) times daily. -     albuterol (PROAIR HFA) 108 (90 Base) MCG/ACT inhaler; Inhale 8 puffs into the lungs every 6 (six) hours as needed for wheezing or shortness of breath. -     fluticasone (FLONASE) 50 MCG/ACT nasal spray; Place 2 sprays into both nostrils daily.  Chronic nonintractable headache, unspecified headache type -     Butalbital-APAP-Caffeine 50-300-40 MG CAPS; Take 1 tablet by mouth 3 (three) times daily as needed (headache).   Be sure to rinse your mouth out with water after using dulera to avoid sore throat.  F/u in 4-6 weeks for pap smear   Dr. Adrian Blackwater

## 2015-04-04 NOTE — Progress Notes (Signed)
Used Geneticist, molecular Y ang Hmok  Establish Care HA and asthma Hx Pain scale # 4 No tobacco user  No suicidal thoughts in the past two weeks

## 2015-04-04 NOTE — Progress Notes (Signed)
Subjective:  Patient ID: Tina [name], female    DOB: [birth year]  Age: 60 y.o. MRN: [id number]  CC: Asthma   HPI Sherilee Korb presents for    1. Asthma: well controlled with inhalers. Requesting albuterol change to proair as it seems to work better for her husband.  2. chronic headaches:x 2-3 years. R sided. With blurry vision when pain is bad. No fever, chills, nausea, emesis or weakness. No meds.   Past Surgical History  Procedure Laterality Date  . Kidney stone surgery      spouse denies this hx on 12/21/2012  . Laparoscopic cholecystectomy  12/21/2012  . Cholecystectomy N/A 12/21/2012    Procedure: LAPAROSCOPIC CHOLECYSTECTOMY;  Surgeon: Ralene Ok, MD;  Location: [place];  Service: General;  Laterality: N/A;   Social History  Substance Use Topics  . Smoking status: Never Smoker   . Smokeless tobacco: Never Used  . Alcohol Use: No  1 Outpatient Prescriptions Prior to Visit  Medication Sig Dispense Refill  . albuterol (PROVENTIL HFA;VENTOLIN HFA) 108 (90 BASE) MCG/ACT inhaler Inhale 2 puffs into the lungs every 6 (six) hours as needed for wheezing or shortness of breath. 1 Inhaler 4  . amoxicillin-clavulanate (AUGMENTIN) 875-125 MG per tablet Take 1 tablet by mouth 2 (two) times daily. 20 tablet 0  . azithromycin (ZITHROMAX Z-PAK) 250 MG tablet Take as directed 6 each 0  . butalbital-acetaminophen-caffeine (ESGIC) 50-325-40 MG per tablet Take 1 tablet by mouth every 6 (six) hours as needed for headache. 30 tablet 0  . dextromethorphan-guaiFENesin (MUCINEX DM) 30-600 MG per 12 hr tablet Take 1 tablet by mouth 2 (two) times daily as needed for cough. 60 tablet 1  . fluticasone (FLONASE) 50 MCG/ACT nasal spray Place 2 sprays into both nostrils daily. 16 g 3  . Fluticasone-Salmeterol (ADVAIR) 100-50 MCG/DOSE AEPB Inhale 1 puff into the lungs 2 (two) times daily. 1 each 4  . HYDROcodone-acetaminophen (NORCO/VICODIN) 5-325 MG per tablet Take 1-2 tablets by mouth every 6 (six) hours  as needed (TAKE 1 TABLET FOR MILD TO MODERATE PAIN, TAKE 2 TABLETS FOR SEVERE PAIN). (Patient not taking: Reported on 02/22/2014) 40 tablet 0  . lactulose (CHRONULAC) 10 GM/15ML solution Take 30 mLs (20 g total) by mouth daily as needed for mild constipation. 240 mL 1  . naproxen (NAPROSYN) 500 MG tablet Take 1 tablet (500 mg total) by mouth 2 (two) times daily with a meal. 30 tablet 2   No facility-administered medications prior to visit.    ROS Review of Systems  Constitutional: Negative for fever and chills.  HENT: Positive for sore throat.   Eyes: Positive for visual disturbance.  Respiratory: Negative for shortness of breath.   Cardiovascular: Negative for chest pain.  Gastrointestinal: Positive for abdominal pain. Negative for blood in stool.  Musculoskeletal: Negative for back pain and arthralgias.  Skin: Negative for rash.  Allergic/Immunologic: Negative for immunocompromised state.  Neurological: Positive for headaches.  Hematological: Negative for adenopathy. Does not bruise/bleed easily.  Psychiatric/Behavioral: Negative for suicidal ideas and dysphoric mood.    Objective:  BP 129/74 mmHg  Pulse 71  Temp(Src) 98.4 F (36.9 C) (Oral)  Resp 16  Ht 4\' 10"  (1.473 m)  Wt 130 lb (58.968 kg)  BMI 27.18 kg/m2  SpO2 97%  BP/Weight 04/04/2015 08/13/2014 [id number]  Systolic BP [id number]  Diastolic BP 74 77 73  Wt. (Lbs) 130 130.8 132  BMI 27.18 27.34 27.6   Physical Exam  Constitutional: She is oriented to person, place,  and time. She appears well-developed and well-nourished. No distress.  HENT:  Head: Normocephalic and atraumatic.  Right Ear: External ear normal.  Left Ear: External ear normal.  Nose: Nose normal.  Mouth/Throat: Oropharynx is clear and moist.  Eyes: Conjunctivae and EOM are normal. Pupils are equal, round, and reactive to light.  Cardiovascular: Normal rate, regular rhythm, normal heart sounds and intact distal pulses.   Pulmonary/Chest: Effort  normal and breath sounds normal.  Musculoskeletal: She exhibits no edema.  Neurological: She is alert and oriented to person, place, and time. No cranial nerve deficit. She exhibits normal muscle tone. Coordination normal.  Skin: Skin is warm and dry. No rash noted.  Psychiatric: She has a normal mood and affect.   Lab Results  Component Value Date   HGBA1C 6.20 04/04/2015    Assessment & Plan:   Janira was seen today for asthma.  Diagnoses and all orders for this visit:  Healthcare maintenance -     POCT glycosylated hemoglobin (Hb A1C) -     Flu Vaccine QUAD 36+ mos IM -     MM DIGITAL SCREENING BILATERAL; Future  Asthma, unspecified asthma severity, uncomplicated -     mometasone-formoterol (DULERA) 100-5 MCG/ACT AERO; Inhale 2 puffs into the lungs 2 (two) times daily. -     Discontinue: albuterol (PROAIR HFA) 108 (90 Base) MCG/ACT inhaler; Inhale 8 puffs into the lungs every 6 (six) hours as needed for wheezing or shortness of breath. -     fluticasone (FLONASE) 50 MCG/ACT nasal spray; Place 2 sprays into both nostrils daily. -     Fluticasone-Salmeterol (ADVAIR) 100-50 MCG/DOSE AEPB; Inhale 1 puff into the lungs 2 (two) times daily. -     albuterol (PROAIR HFA) 108 (90 Base) MCG/ACT inhaler; Inhale 2 puffs into the lungs every 6 (six) hours as needed for wheezing or shortness of breath.  Chronic nonintractable headache, unspecified headache type -     Discontinue: Butalbital-APAP-Caffeine 50-300-40 MG CAPS; Take 1 tablet by mouth 3 (three) times daily as needed (headache). -     butalbital-acetaminophen-caffeine (FIORICET) 50-325-40 MG tablet; Take 1 tablet by mouth every 6 (six) hours as needed for headache.   Follow-up: No Follow-up on file.   Boykin Nearing MD

## 2015-04-05 ENCOUNTER — Other Ambulatory Visit: Payer: Self-pay | Admitting: Internal Medicine

## 2015-04-05 ENCOUNTER — Other Ambulatory Visit: Payer: Self-pay | Admitting: Family Medicine

## 2015-04-05 DIAGNOSIS — Z1231 Encounter for screening mammogram for malignant neoplasm of breast: Secondary | ICD-10-CM

## 2015-04-09 ENCOUNTER — Telehealth: Payer: Self-pay | Admitting: *Deleted

## 2015-04-09 NOTE — Telephone Encounter (Signed)
Mammogram appointment on Apr 18 2015 at 1:20 [facility]  Mail information to Pt

## 2015-04-18 ENCOUNTER — Ambulatory Visit
Admission: RE | Admit: 2015-04-18 | Discharge: 2015-04-18 | Disposition: A | Discharge status: Home / Self Care | Payer: Medicare Other | Source: Ambulatory Visit | Attending: Family Medicine | Admitting: Family Medicine

## 2015-04-18 DIAGNOSIS — Z0000 Encounter for general adult medical examination without abnormal findings: Secondary | ICD-10-CM

## 2015-04-18 DIAGNOSIS — Z1231 Encounter for screening mammogram for malignant neoplasm of breast: Secondary | ICD-10-CM | POA: Diagnosis not present

## 2015-05-17 ENCOUNTER — Other Ambulatory Visit (HOSPITAL_COMMUNITY)
Admission: RE | Admit: 2015-05-17 | Discharge: 2015-05-17 | Disposition: A | Discharge status: Home / Self Care | Payer: Medicare Other | Source: Ambulatory Visit | Attending: Internal Medicine | Admitting: Internal Medicine

## 2015-05-17 ENCOUNTER — Ambulatory Visit: Payer: Medicare Other | Attending: Family Medicine | Admitting: Internal Medicine

## 2015-05-17 ENCOUNTER — Encounter: Payer: Self-pay | Admitting: Internal Medicine

## 2015-05-17 VITALS — BP 140/84 | HR 77 | Temp 98.0°F | Resp 16 | Ht <= 58 in | Wt 131.0 lb

## 2015-05-17 DIAGNOSIS — Z113 Encounter for screening for infections with a predominantly sexual mode of transmission: Secondary | ICD-10-CM | POA: Insufficient documentation

## 2015-05-17 DIAGNOSIS — Z1272 Encounter for screening for malignant neoplasm of vagina: Secondary | ICD-10-CM | POA: Diagnosis present

## 2015-05-17 DIAGNOSIS — Z1151 Encounter for screening for human papillomavirus (HPV): Secondary | ICD-10-CM | POA: Diagnosis not present

## 2015-05-17 DIAGNOSIS — Z87442 Personal history of urinary calculi: Secondary | ICD-10-CM | POA: Diagnosis not present

## 2015-05-17 DIAGNOSIS — Z01419 Encounter for gynecological examination (general) (routine) without abnormal findings: Secondary | ICD-10-CM | POA: Insufficient documentation

## 2015-05-17 DIAGNOSIS — N760 Acute vaginitis: Secondary | ICD-10-CM | POA: Insufficient documentation

## 2015-05-17 DIAGNOSIS — Z124 Encounter for screening for malignant neoplasm of cervix: Secondary | ICD-10-CM | POA: Diagnosis not present

## 2015-05-17 DIAGNOSIS — J45909 Unspecified asthma, uncomplicated: Secondary | ICD-10-CM | POA: Diagnosis not present

## 2015-05-17 DIAGNOSIS — Z79899 Other long term (current) drug therapy: Secondary | ICD-10-CM | POA: Diagnosis not present

## 2015-05-17 NOTE — Patient Instructions (Signed)
Pap Test WHY AM I HAVING THIS TEST? A pap test is sometimes called a pap smear. It is a screening test that is used to check for signs of cancer of the vagina, cervix, and uterus. The test can also identify the presence of infection or precancerous changes. Your health care provider will likely recommend you have this test done on a regular basis. This test may be done:  Every 3 years, starting at age 61.  Every 5 years, in combination with testing for the presence of human papillomavirus (HPV).  More or less often depending on other medical conditions.  WHAT KIND OF SAMPLE IS TAKEN? Using a small cotton swab, plastic spatula, or brush, your health care provider will collect a sample of cells from the surface of your cervix. Your cervix is the opening to your uterus, also called a womb. Secretions from the cervix and vagina may also be collected. HOW DO I PREPARE FOR THE TEST?  Be aware of where you are in your menstrual cycle. You may be asked to reschedule the test if you are menstruating on the day of the test.  You may need to reschedule if you have a known vaginal infection on the day of the test.  You may be asked to avoid douching or taking a bath the day before or the day of the test.  Some medicines can cause abnormal test results, such as digitalis and tetracycline. Talk with your health care provider before your test if you take one of these medicines. WHAT DO THE RESULTS MEAN? Abnormal test results may indicate a number of health conditions. These may include:  Cancer. Although pap test results cannot be used to diagnose cancer of the cervix, vagina, or uterus, they may suggest the possibility of cancer. Further tests would be required to determine if cancer is present.  Sexually transmitted disease.  Fungal infection.  Parasite infection.  Herpes infection.  A condition causing or contributing to infertility. It is your responsibility to obtain your test results. Ask  the lab or department performing the test when and how you will get your results. Contact your health care provider to discuss any questions you have about your results.   This information is not intended to replace advice given to you by your health care provider. Make sure you discuss any questions you have with your health care provider.   Document Released: 05/16/2002 Document Revised: 03/16/2014 Document Reviewed: 07/17/2013 Elsevier Interactive Patient Education 2016 [employer]  

## 2015-05-17 NOTE — Progress Notes (Signed)
Patient here for pap only. 

## 2015-05-17 NOTE — Progress Notes (Signed)
Patient ID: Tina [name], female   DOB: 12-01-1955, 60 y.o.   MRN: [id number]  CC: pap smear  HPI: Tina [name] is a 60 y.o. female here today for a pap smear.  Patient has past medical history of asthma. Patient denies complaints of vaginal discharge, itch, odor, or lesions. She denies any new sexual partners. Patient reports that she recently had a mammogram last month that was normal. She denies any complaints today.   No Known Allergies Past Medical History  Diagnosis Date  . Asthma   . Nephrolithiasis     "came out on it's own" (12/21/2012)   Current Outpatient Prescriptions on File Prior to Visit  Medication Sig Dispense Refill  . albuterol (PROAIR HFA) 108 (90 Base) MCG/ACT inhaler Inhale 2 puffs into the lungs every 6 (six) hours as needed for wheezing or shortness of breath. 8 g 5  . butalbital-acetaminophen-caffeine (FIORICET) 50-325-40 MG tablet Take 1 tablet by mouth every 6 (six) hours as needed for headache. 30 tablet 1  . fluticasone (FLONASE) 50 MCG/ACT nasal spray Place 2 sprays into both nostrils daily. 16 g 3  . Fluticasone-Salmeterol (ADVAIR) 100-50 MCG/DOSE AEPB Inhale 1 puff into the lungs 2 (two) times daily. 1 each 3  . mometasone-formoterol (DULERA) 100-5 MCG/ACT AERO Inhale 2 puffs into the lungs 2 (two) times daily. 13 g 5   No current facility-administered medications on file prior to visit.   Family History  Problem Relation Age of Onset  . Tuberculosis Mother   . Asthma Mother   . Asthma Father   . Tuberculosis Father    Social History   Social History  . Marital Status: Married    Spouse Name: N/A  . Number of Children: N/A  . Years of Education: N/A   Occupational History  . Not on file.   Social History Main Topics  . Smoking status: Never Smoker   . Smokeless tobacco: Never Used  . Alcohol Use: No  . Drug Use: No  . Sexual Activity: Yes   Other Topics Concern  . Not on file   Social History Narrative    Review of  Systems: Constitutional: Negative for fever, chills, diaphoresis, activity change, appetite change and fatigue. HENT: Negative for ear pain, nosebleeds, congestion, facial swelling, rhinorrhea, neck pain, neck stiffness and ear discharge.  Eyes: Negative for pain, discharge, redness, itching and visual disturbance. Respiratory: Negative for cough, choking, chest tightness, shortness of breath, wheezing and stridor.  Cardiovascular: Negative for chest pain, palpitations and leg swelling. Gastrointestinal: Negative for abdominal distention. Genitourinary: Negative for dysuria, urgency, frequency, hematuria, flank pain, decreased urine volume, difficulty urinating and dyspareunia.  Musculoskeletal: Negative for back pain, joint swelling, arthralgias and gait problem. Neurological: Negative for dizziness, tremors, seizures, syncope, facial asymmetry, speech difficulty, weakness, light-headedness, numbness and headaches.  Hematological: Negative for adenopathy. Does not bruise/bleed easily. Psychiatric/Behavioral: Negative for hallucinations, behavioral problems, confusion, dysphoric mood, decreased concentration and agitation.    Objective:   Filed Vitals:   05/17/15 1056  BP: 140/84  Pulse: 77  Temp: 98 F (36.7 C)  Resp: 16    Physical Exam  Constitutional: She is oriented to person, place, and time.  Pulmonary/Chest: Breath sounds normal. Right breast exhibits no mass. Left breast exhibits no mass.  Genitourinary: Vagina normal and uterus normal. No breast tenderness or discharge. Cervix exhibits no motion tenderness, no discharge and no friability. Right adnexum displays no tenderness. Left adnexum displays no tenderness.  Lymphadenopathy:       Right:  No inguinal adenopathy present.       Left: No inguinal adenopathy present.  Neurological: She is alert and oriented to person, place, and time.     Lab Results  Component Value Date   WBC 10.7* 02/22/2014   HGB 14.5 02/22/2014    HCT 43.2 02/22/2014   MCV 76.5* 02/22/2014   PLT 248 02/22/2014   Lab Results  Component Value Date   CREATININE 0.78 08/13/2014   BUN 12 08/13/2014   NA 141 08/13/2014   K 4.1 08/13/2014   CL 107 08/13/2014   CO2 23 08/13/2014    Lab Results  Component Value Date   HGBA1C 6.20 04/04/2015   Lipid Panel  No results found for: CHOL, TRIG, HDL, CHOLHDL, VLDL, LDLCALC     Assessment and plan:   Diagnoses and all orders for this visit:  Papanicolaou smear -     Cytology - PAP [facility] Will call patient with results. If normal she will need one more pap in 3 years.  Due to language barrier, an interpreter was present during the history-taking and subsequent discussion (and for part of the physical exam) with this patient.  Return if symptoms worsen or fail to improve.       Lance Bosch, [facility] [phone] 05/17/2015, 10:47 AM

## 2015-05-20 LAB — CERVICOVAGINAL ANCILLARY ONLY
Chlamydia: NEGATIVE
Neisseria Gonorrhea: NEGATIVE
Wet Prep (BD Affirm): NEGATIVE

## 2015-05-20 LAB — CYTOLOGY - PAP

## 2015-05-28 ENCOUNTER — Telehealth: Payer: Self-pay

## 2015-05-28 NOTE — Telephone Encounter (Signed)
Interpreter line used [place] interpreter was not available at this time Unable to contact patient

## 2015-05-28 NOTE — Telephone Encounter (Signed)
-----   Message from Lance Bosch, NP sent at 05/28/2015  3:30 PM EDT ----- Pap was normal. May repeat in 3 years

## 2015-07-08 ENCOUNTER — Ambulatory Visit: Payer: Medicare Other | Attending: Family Medicine | Admitting: Family Medicine

## 2015-07-08 ENCOUNTER — Other Ambulatory Visit: Payer: Self-pay | Admitting: Family Medicine

## 2015-07-08 ENCOUNTER — Encounter: Payer: Self-pay | Admitting: Family Medicine

## 2015-07-08 ENCOUNTER — Telehealth: Payer: Self-pay | Admitting: Family Medicine

## 2015-07-08 VITALS — BP 148/72 | HR 66 | Temp 98.1°F | Resp 16 | Ht <= 58 in | Wt 130.0 lb

## 2015-07-08 DIAGNOSIS — R519 Headache, unspecified: Secondary | ICD-10-CM

## 2015-07-08 DIAGNOSIS — R51 Headache: Secondary | ICD-10-CM | POA: Insufficient documentation

## 2015-07-08 DIAGNOSIS — K5900 Constipation, unspecified: Secondary | ICD-10-CM | POA: Insufficient documentation

## 2015-07-08 DIAGNOSIS — J45909 Unspecified asthma, uncomplicated: Secondary | ICD-10-CM | POA: Diagnosis not present

## 2015-07-08 DIAGNOSIS — K5901 Slow transit constipation: Secondary | ICD-10-CM

## 2015-07-08 DIAGNOSIS — Z114 Encounter for screening for human immunodeficiency virus [HIV]: Secondary | ICD-10-CM | POA: Diagnosis not present

## 2015-07-08 DIAGNOSIS — Z79899 Other long term (current) drug therapy: Secondary | ICD-10-CM | POA: Insufficient documentation

## 2015-07-08 DIAGNOSIS — Z0000 Encounter for general adult medical examination without abnormal findings: Secondary | ICD-10-CM

## 2015-07-08 DIAGNOSIS — Z1159 Encounter for screening for other viral diseases: Secondary | ICD-10-CM | POA: Diagnosis not present

## 2015-07-08 DIAGNOSIS — R1030 Lower abdominal pain, unspecified: Secondary | ICD-10-CM | POA: Diagnosis not present

## 2015-07-08 DIAGNOSIS — IMO0001 Reserved for inherently not codable concepts without codable children: Secondary | ICD-10-CM | POA: Insufficient documentation

## 2015-07-08 DIAGNOSIS — R030 Elevated blood-pressure reading, without diagnosis of hypertension: Secondary | ICD-10-CM

## 2015-07-08 LAB — HIV ANTIBODY (ROUTINE TESTING W REFLEX): HIV: NONREACTIVE

## 2015-07-08 LAB — HEPATITIS C ANTIBODY: HCV AB: NEGATIVE

## 2015-07-08 MED ORDER — CETIRIZINE HCL 10 MG PO TABS: 10.0000 mg | ORAL_TABLET | Freq: Every day | ORAL | Status: DC

## 2015-07-08 MED ORDER — POLYETHYLENE GLYCOL 3350 17 GM/SCOOP PO POWD: 17.0000 g | Freq: Every day | ORAL | Status: DC

## 2015-07-08 MED ORDER — BUTALBITAL-APAP-CAFFEINE 50-325-40 MG PO TABS: 1.0000 | ORAL_TABLET | Freq: Four times a day (QID) | ORAL | Status: DC | PRN

## 2015-07-08 MED ORDER — MOMETASONE FURO-FORMOTEROL FUM 100-5 MCG/ACT IN AERO: 2.0000 | INHALATION_SPRAY | Freq: Two times a day (BID) | RESPIRATORY_TRACT | Status: DC

## 2015-07-08 MED ORDER — ALBUTEROL SULFATE HFA 108 (90 BASE) MCG/ACT IN AERS
2.0000 | INHALATION_SPRAY | Freq: Four times a day (QID) | RESPIRATORY_TRACT | Status: DC | PRN
Start: 2015-07-08 — End: 2015-12-09

## 2015-07-08 MED ORDER — FLUTICASONE-SALMETEROL 100-50 MCG/DOSE IN AEPB: 1.0000 | INHALATION_SPRAY | Freq: Two times a day (BID) | RESPIRATORY_TRACT | Status: DC

## 2015-07-08 MED ORDER — FLUTICASONE PROPIONATE 50 MCG/ACT NA SUSP: 2.0000 | Freq: Every day | NASAL | Status: DC

## 2015-07-08 MED FILL — ADVAIR 100/50 DISKUS: 100-50 | 30 days supply | Qty: 60 | Fill #0

## 2015-07-08 MED FILL — POLYETHYLENE GLYCOL 3350: 30 days supply | Qty: 510 | Fill #0

## 2015-07-08 MED FILL — FLUTICASONE PROP 50 MCG SPR: 50 | 30 days supply | Qty: 16 | Fill #0

## 2015-07-08 MED FILL — PROAIR HFA 90 MCG INHALER: 108 (90 BAS | 30 days supply | Qty: 9 | Fill #0

## 2015-07-08 NOTE — Progress Notes (Signed)
Subjective:  Patient ID: Tina [name], female    DOB: 06/20/1955  Age: 60 y.o. MRN: [id number]  CC: Asthma; Abdominal Pain; and Headache   HPI Tina [name] presents for   1. Asthma: chronic. Out of dulera. Using albuterol prn. Not taking flonase. No CP, SOB or wheezing. No cough. Chronic non smoker.   2. Headache: chronic headache. Occurs 1-3 times per week. R  sided headache. Throbbing headache. Associated with dizziness. Pain in R eye and R ear.  No nausea and vomiting. No fever. Pain occurs from 2 PM- 6 PM. She is out of Fioricet. She has had headache for many years. No neuroimaging in the past.   3. Stomach ache: bilateral mi lower quadrant pain occurs with constipation. No blood in stool. Hard stool. No previous c-scope. No pain now.   Social History  Substance Use Topics  . Smoking status: Never Smoker   . Smokeless tobacco: Never Used  . Alcohol Use: No    Outpatient Prescriptions Prior to Visit  Medication Sig Dispense Refill  . albuterol (PROAIR HFA) 108 (90 Base) MCG/ACT inhaler Inhale 2 puffs into the lungs every 6 (six) hours as needed for wheezing or shortness of breath. 8 g 5  . fluticasone (FLONASE) 50 MCG/ACT nasal spray Place 2 sprays into both nostrils daily. 16 g 3  . mometasone-formoterol (DULERA) 100-5 MCG/ACT AERO Inhale 2 puffs into the lungs 2 (two) times daily. 13 g 5  . butalbital-acetaminophen-caffeine (FIORICET) 50-325-40 MG tablet Take 1 tablet by mouth every 6 (six) hours as needed for headache. (Patient not taking: Reported on 07/08/2015) 30 tablet 1  . Fluticasone-Salmeterol (ADVAIR) 100-50 MCG/DOSE AEPB Inhale 1 puff into the lungs 2 (two) times daily. (Patient not taking: Reported on 07/08/2015) 1 each 3   No facility-administered medications prior to visit.    ROS Review of Systems  Constitutional: Negative for fever and chills.  HENT: Positive for ear pain. Negative for congestion, dental problem, ear discharge, hearing loss, rhinorrhea and sinus  pressure.   Eyes: Positive for pain. Negative for photophobia, discharge, redness, itching and visual disturbance.  Respiratory: Negative for cough and shortness of breath.   Cardiovascular: Negative for chest pain.  Gastrointestinal: Positive for abdominal pain and constipation. Negative for nausea, vomiting, diarrhea, blood in stool, abdominal distention, anal bleeding and rectal pain.  Musculoskeletal: Negative for back pain and arthralgias.  Skin: Negative for rash.  Allergic/Immunologic: Negative for immunocompromised state.  Neurological: Positive for headaches.  Hematological: Negative for adenopathy. Does not bruise/bleed easily.  Psychiatric/Behavioral: Negative for suicidal ideas and dysphoric mood.    Objective:  BP 148/72 mmHg  Pulse 66  Temp(Src) 98.1 F (36.7 C) (Oral)  Resp 16  Ht 4\' 10"  (1.473 m)  Wt 130 lb (58.968 kg)  BMI 27.18 kg/m2  SpO2 97%  BP/Weight 07/08/2015 05/17/2015 0000000  Systolic BP 123456 [id number]  Diastolic BP 72 84 74  Wt. (Lbs) 130 131 130  BMI 27.18 27.39 27.18   Physical Exam  Constitutional: She is oriented to person, place, and time. She appears well-developed and well-nourished. No distress.  HENT:  Head: Normocephalic and atraumatic.  Nose: Mucosal edema present.  Cardiovascular: Normal rate, regular rhythm, normal heart sounds and intact distal pulses.   Pulmonary/Chest: Effort normal and breath sounds normal.  Abdominal: Soft. She exhibits no distension and no mass. There is no tenderness. There is no rebound and no guarding.  Musculoskeletal: She exhibits no edema.  Neurological: She is alert and oriented to person,  place, and time.  Skin: Skin is warm and dry. No rash noted.  Psychiatric: She has a normal mood and affect.    Assessment & Plan:   There are no diagnoses linked to this encounter. Tina [name] was seen today for asthma, abdominal pain and headache.  Diagnoses and all orders for this visit:  Lower abdominal pain -      polyethylene glycol powder (GLYCOLAX/MIRALAX) powder; Take 17 g by mouth daily.  Slow transit constipation -     polyethylene glycol powder (GLYCOLAX/MIRALAX) powder; Take 17 g by mouth daily.  Asthma, unspecified asthma severity, uncomplicated -     albuterol (PROAIR HFA) 108 (90 Base) MCG/ACT inhaler; Inhale 2 puffs into the lungs every 6 (six) hours as needed for wheezing or shortness of breath. -     mometasone-formoterol (DULERA) 100-5 MCG/ACT AERO; Inhale 2 puffs into the lungs 2 (two) times daily. -     fluticasone (FLONASE) 50 MCG/ACT nasal spray; Place 2 sprays into both nostrils daily. -     cetirizine (ZYRTEC) 10 MG tablet; Take 1 tablet (10 mg total) by mouth daily.  Chronic nonintractable headache, unspecified headache type -     butalbital-acetaminophen-caffeine (FIORICET) 50-325-40 MG tablet; Take 1 tablet by mouth every 6 (six) hours as needed for headache. -     CT Head Wo Contrast; Future  Healthcare maintenance -     Ambulatory referral to Dentistry -     Ambulatory referral to Gastroenterology  Screening for HIV (human immunodeficiency virus) -     HIV antibody (with reflex)  Need for hepatitis C screening test -     Hepatitis C antibody, reflex   No orders of the defined types were placed in this encounter.    Follow-up: No Follow-up on file.   Boykin Nearing MD

## 2015-07-08 NOTE — Progress Notes (Signed)
Used Location manager interpreter Ronny Flurry [employer] F/U  Asthma  Complaining stomach ache and HA No pain today  No tobacco user  No suicidal thought in the past two weeks

## 2015-07-08 NOTE — Patient Instructions (Addendum)
Tina [name] was seen today for asthma, abdominal pain and headache.  Diagnoses and all orders for this visit:  Lower abdominal pain -     polyethylene glycol powder (GLYCOLAX/MIRALAX) powder; Take 17 g by mouth daily.  Slow transit constipation -     polyethylene glycol powder (GLYCOLAX/MIRALAX) powder; Take 17 g by mouth daily.  Asthma, unspecified asthma severity, uncomplicated -     albuterol (PROAIR HFA) 108 (90 Base) MCG/ACT inhaler; Inhale 2 puffs into the lungs every 6 (six) hours as needed for wheezing or shortness of breath. -     mometasone-formoterol (DULERA) 100-5 MCG/ACT AERO; Inhale 2 puffs into the lungs 2 (two) times daily. -     fluticasone (FLONASE) 50 MCG/ACT nasal spray; Place 2 sprays into both nostrils daily. -     cetirizine (ZYRTEC) 10 MG tablet; Take 1 tablet (10 mg total) by mouth daily.  Chronic nonintractable headache, unspecified headache type -     butalbital-acetaminophen-caffeine (FIORICET) 50-325-40 MG tablet; Take 1 tablet by mouth every 6 (six) hours as needed for headache. -     CT Head Wo Contrast; Future  Healthcare maintenance -     Ambulatory referral to Dentistry -     Ambulatory referral to Gastroenterology  Screening for HIV (human immunodeficiency virus) -     HIV antibody (with reflex)  Need for hepatitis C screening test -     Hepatitis C antibody, reflex   F/u in 3 months   Dr. Adrian Blackwater

## 2015-07-08 NOTE — Assessment & Plan Note (Signed)
Constipation causing abdominal pains Plan for miralax Screening colonoscopy

## 2015-07-08 NOTE — Assessment & Plan Note (Signed)
Chronic headache with normal exam except swollen nasal turbinates Plan for zyrtec Refilled Fioricet  CT head given chronic nature of headache

## 2015-07-08 NOTE — Assessment & Plan Note (Signed)
BP elevated today with normal BP in the past Will repeat check at treat as needed

## 2015-07-08 NOTE — Telephone Encounter (Signed)
Pt. Was seen today and has an appointment scheduled for a CT scan on 07/12/15 and the pt. Can not go on that date. Pt. Would like to go on 07/17/15. Please f/u with pt.

## 2015-07-08 NOTE — Assessment & Plan Note (Signed)
Chronic asthma controlled Refilled advair, preferred by her insurance Refilled albuterol Refilled flonase Add zyrtec

## 2015-07-12 ENCOUNTER — Ambulatory Visit (HOSPITAL_COMMUNITY): Payer: Managed Care, Other (non HMO)

## 2015-07-12 ENCOUNTER — Telehealth: Payer: Self-pay | Admitting: *Deleted

## 2015-07-12 NOTE — Telephone Encounter (Signed)
-----   Message from Boykin Nearing, MD sent at 07/09/2015  8:24 AM EDT ----- Screening Hep C negative

## 2015-07-12 NOTE — Telephone Encounter (Signed)
-----   Message from Boykin Nearing, MD sent at 07/09/2015  8:24 AM EDT ----- Screening HIV negative

## 2015-07-12 NOTE — Telephone Encounter (Signed)
Unable to contact pt  No interpreter available

## 2015-07-17 ENCOUNTER — Ambulatory Visit (HOSPITAL_COMMUNITY)
Admission: RE | Admit: 2015-07-17 | Discharge: 2015-07-17 | Disposition: A | Discharge status: Home / Self Care | Payer: Medicare Other | Source: Ambulatory Visit | Attending: Family Medicine | Admitting: Family Medicine

## 2015-07-17 DIAGNOSIS — R51 Headache: Secondary | ICD-10-CM | POA: Diagnosis not present

## 2015-07-17 DIAGNOSIS — G8929 Other chronic pain: Secondary | ICD-10-CM

## 2015-08-28 DIAGNOSIS — Z1211 Encounter for screening for malignant neoplasm of colon: Secondary | ICD-10-CM | POA: Diagnosis not present

## 2015-08-28 DIAGNOSIS — K5900 Constipation, unspecified: Secondary | ICD-10-CM | POA: Diagnosis not present

## 2015-09-02 MED FILL — TRILYTE WITH FLAVOR PACKETS: 420 | 1 days supply | Qty: 4000 | Fill #0

## 2015-09-09 ENCOUNTER — Other Ambulatory Visit: Payer: Self-pay | Admitting: Gastroenterology

## 2015-09-09 DIAGNOSIS — K635 Polyp of colon: Secondary | ICD-10-CM | POA: Diagnosis not present

## 2015-09-09 DIAGNOSIS — D125 Benign neoplasm of sigmoid colon: Secondary | ICD-10-CM | POA: Diagnosis not present

## 2015-09-09 DIAGNOSIS — D126 Benign neoplasm of colon, unspecified: Secondary | ICD-10-CM | POA: Diagnosis not present

## 2015-09-09 DIAGNOSIS — Z1211 Encounter for screening for malignant neoplasm of colon: Secondary | ICD-10-CM | POA: Diagnosis not present

## 2015-09-09 DIAGNOSIS — D123 Benign neoplasm of transverse colon: Secondary | ICD-10-CM | POA: Diagnosis not present

## 2015-09-09 DIAGNOSIS — K5730 Diverticulosis of large intestine without perforation or abscess without bleeding: Secondary | ICD-10-CM | POA: Diagnosis not present

## 2015-10-09 MED FILL — ADVAIR 100/50 DISKUS: 100-50 | 30 days supply | Qty: 60 | Fill #1

## 2015-10-09 MED FILL — PROAIR HFA 90 MCG INHALER: 108 (90 BAS | 30 days supply | Qty: 9 | Fill #1

## 2015-12-09 ENCOUNTER — Encounter: Payer: Self-pay | Admitting: Family Medicine

## 2015-12-09 ENCOUNTER — Ambulatory Visit: Payer: Medicare Other | Attending: Family Medicine | Admitting: Family Medicine

## 2015-12-09 VITALS — BP 143/82 | HR 63 | Temp 98.6°F | Ht <= 58 in | Wt 126.0 lb

## 2015-12-09 DIAGNOSIS — I10 Essential (primary) hypertension: Secondary | ICD-10-CM | POA: Diagnosis not present

## 2015-12-09 DIAGNOSIS — J4520 Mild intermittent asthma, uncomplicated: Secondary | ICD-10-CM

## 2015-12-09 DIAGNOSIS — K210 Gastro-esophageal reflux disease with esophagitis: Secondary | ICD-10-CM | POA: Insufficient documentation

## 2015-12-09 DIAGNOSIS — R51 Headache: Secondary | ICD-10-CM | POA: Diagnosis not present

## 2015-12-09 DIAGNOSIS — Z23 Encounter for immunization: Secondary | ICD-10-CM

## 2015-12-09 DIAGNOSIS — K219 Gastro-esophageal reflux disease without esophagitis: Secondary | ICD-10-CM | POA: Insufficient documentation

## 2015-12-09 DIAGNOSIS — G8929 Other chronic pain: Secondary | ICD-10-CM

## 2015-12-09 MED ORDER — FLUTICASONE-SALMETEROL 100-50 MCG/DOSE IN AEPB
1.0000 | INHALATION_SPRAY | Freq: Two times a day (BID) | RESPIRATORY_TRACT | 11 refills | Status: DC
Start: 2015-12-09 — End: 2016-01-14

## 2015-12-09 MED ORDER — RANITIDINE HCL 300 MG PO TABS: 300.0000 mg | ORAL_TABLET | Freq: Every day | ORAL | 0 refills | Status: DC

## 2015-12-09 MED ORDER — FLUTICASONE PROPIONATE 50 MCG/ACT NA SUSP: 2.0000 | Freq: Every day | NASAL | 11 refills | Status: DC

## 2015-12-09 MED ORDER — HYDROCHLOROTHIAZIDE 12.5 MG PO TABS: 12.5000 mg | ORAL_TABLET | Freq: Every day | ORAL | 2 refills | Status: DC

## 2015-12-09 MED ORDER — ALBUTEROL SULFATE HFA 108 (90 BASE) MCG/ACT IN AERS: 2.0000 | INHALATION_SPRAY | Freq: Four times a day (QID) | RESPIRATORY_TRACT | 5 refills | Status: DC | PRN

## 2015-12-09 MED ORDER — CETIRIZINE HCL 10 MG PO TABS: 10.0000 mg | ORAL_TABLET | Freq: Every day | ORAL | 11 refills | Status: DC

## 2015-12-09 MED FILL — raNITIdine HCL 150 MG TABS: 150 | 30 days supply | Qty: 60 | Fill #0

## 2015-12-09 MED FILL — HYDROCHLOROTHIAZIDE 12.5 MG: 12.5 | 30 days supply | Qty: 30 | Fill #0

## 2015-12-09 MED FILL — PROAIR HFA 90 MCG INHALER: 108 (90 BAS | 20 days supply | Qty: 9 | Fill #0

## 2015-12-09 MED FILL — FLUTICASONE PROP 50 MCG SPR: 50 | 30 days supply | Qty: 16 | Fill #0

## 2015-12-09 MED FILL — ADVAIR 100/50 DISKUS: 100-50 | 30 days supply | Qty: 60 | Fill #0

## 2015-12-09 NOTE — Assessment & Plan Note (Signed)
Chronic HA CT head normal  Plan: Treat BP Considered  BB or CCB to double has HA prevention but patient with low HR Consider neuro referral or add elavil or topomax at next OV

## 2015-12-09 NOTE — Assessment & Plan Note (Signed)
Throat pain and epigastric pain x 3 weeks concerning for GERD Plan  Zantac

## 2015-12-09 NOTE — Progress Notes (Signed)
Pt is having headaches, abdominal pains, and not sleeping well.  Pt is getting flu shot today.

## 2015-12-09 NOTE — Assessment & Plan Note (Addendum)
Persistently elevated BP with HA Add HCTZ 12.5 mg daily

## 2015-12-09 NOTE — Patient Instructions (Addendum)
Tina [name] was seen today for headache.  Diagnoses and all orders for this visit:  Mild intermittent asthma without complication -     Fluticasone-Salmeterol (ADVAIR) 100-50 MCG/DOSE AEPB; Inhale 1 puff into the lungs 2 (two) times daily. -     albuterol (PROAIR HFA) 108 (90 Base) MCG/ACT inhaler; Inhale 2 puffs into the lungs every 6 (six) hours as needed for wheezing or shortness of breath. -     fluticasone (FLONASE) 50 MCG/ACT nasal spray; Place 2 sprays into both nostrils daily. -     cetirizine (ZYRTEC) 10 MG tablet; Take 1 tablet (10 mg total) by mouth daily.  Essential hypertension -     hydrochlorothiazide (HYDRODIURIL) 12.5 MG tablet; Take 1 tablet (12.5 mg total) by mouth daily.  Gastroesophageal reflux disease, esophagitis presence not specified -     ranitidine (ZANTAC) 300 MG tablet; Take 1 tablet (300 mg total) by mouth at bedtime.    F/u in 3-4 weeks for HTN, GERD and headache   Dr. Adrian Blackwater

## 2015-12-09 NOTE — Progress Notes (Signed)
Subjective:  Patient ID: Tina [name], female    DOB: Mar 30, 1955  Age: 60 y.o. MRN: [id number]  CC: Headache   HPI Tina [name] presents for   1. Asthma: chronic. Out af Advair which she was taking only once dialy Using albuterol prn. Not taking flonase or zyrtec. No CP, SOB or wheezing. No cough. Chronic non smoker.   2. Headache: x 2 years. chronic headache. Occur nearly everyday.  R  sided headache. Throbbing headache. Associated with dizziness. Pain in R eye and R ear.  No nausea and vomiting. No fever. Pain occurs from 2 PM- 6 PM.  . CT head done on 07/17/2015 was negative for mass or acute findings.   3. Sore throat: x 3 weeks. With epigastric pain. Pain while swallowing fish only. No fever, chills or cough. She has not traveled in the last 3 months.   Social History  Substance Use Topics  . Smoking status: Never Smoker  . Smokeless tobacco: Never Used  . Alcohol use No    Outpatient Medications Prior to Visit  Medication Sig Dispense Refill  . albuterol (PROAIR HFA) 108 (90 Base) MCG/ACT inhaler Inhale 2 puffs into the lungs every 6 (six) hours as needed for wheezing or shortness of breath. 8 g 5  . butalbital-acetaminophen-caffeine (FIORICET) 50-325-40 MG tablet Take 1 tablet by mouth every 6 (six) hours as needed for headache. 30 tablet 3  . cetirizine (ZYRTEC) 10 MG tablet Take 1 tablet (10 mg total) by mouth daily. 30 tablet 3  . fluticasone (FLONASE) 50 MCG/ACT nasal spray Place 2 sprays into both nostrils daily. 16 g 3  . Fluticasone-Salmeterol (ADVAIR) 100-50 MCG/DOSE AEPB Inhale 1 puff into the lungs 2 (two) times daily. 1 each 11  . polyethylene glycol powder (GLYCOLAX/MIRALAX) powder Take 17 g by mouth daily. 3350 g 2   No facility-administered medications prior to visit.     ROS Review of Systems  Constitutional: Negative for chills and fever.  HENT: Positive for ear pain and sore throat. Negative for congestion, dental problem, ear discharge, hearing loss,  rhinorrhea and sinus pressure.   Eyes: Positive for pain. Negative for photophobia, discharge, redness, itching and visual disturbance.  Respiratory: Negative for cough and shortness of breath.   Cardiovascular: Negative for chest pain.  Gastrointestinal: Positive for abdominal pain and constipation. Negative for abdominal distention, anal bleeding, blood in stool, diarrhea, nausea, rectal pain and vomiting.  Musculoskeletal: Negative for arthralgias and back pain.  Skin: Negative for rash.  Allergic/Immunologic: Negative for immunocompromised state.  Neurological: Positive for headaches.  Hematological: Negative for adenopathy. Does not bruise/bleed easily.  Psychiatric/Behavioral: Negative for dysphoric mood and suicidal ideas.    Objective:  BP (!) 143/82 (BP Location: Left Arm, Patient Position: Sitting, Cuff Size: Small)   Pulse 63   Temp 98.6 F (37 C) (Oral)   Ht 4\' 10"  (1.473 m)   Wt 126 lb (57.2 kg)   SpO2 97%   BMI 26.33 kg/m   BP/Weight 12/09/2015 07/08/2015 [id number]  Systolic BP [id number] [id number] [id number]  Diastolic BP 82 72 84  Wt. (Lbs) 126 130 131  BMI 26.33 27.18 27.39    Physical Exam  Constitutional: She is oriented to person, place, and time. She appears well-developed and well-nourished. No distress.  HENT:  Head: Normocephalic and atraumatic.  Nose: Mucosal edema present.  Cardiovascular: Normal rate, regular rhythm, normal heart sounds and intact distal pulses.   Pulmonary/Chest: Effort normal. She has wheezes.  Abdominal: Soft. She exhibits no distension  and no mass. There is no tenderness. There is no rebound and no guarding.  Musculoskeletal: She exhibits no edema.  Neurological: She is alert and oriented to person, place, and time.  Skin: Skin is warm and dry. No rash noted.  Psychiatric: She has a normal mood and affect.    Assessment & Plan:   There are no diagnoses linked to this encounter. Tina [name] was seen today for headache.  Diagnoses and all orders for  this visit:  Mild intermittent asthma without complication -     Fluticasone-Salmeterol (ADVAIR) 100-50 MCG/DOSE AEPB; Inhale 1 puff into the lungs 2 (two) times daily. -     albuterol (PROAIR HFA) 108 (90 Base) MCG/ACT inhaler; Inhale 2 puffs into the lungs every 6 (six) hours as needed for wheezing or shortness of breath. -     fluticasone (FLONASE) 50 MCG/ACT nasal spray; Place 2 sprays into both nostrils daily. -     cetirizine (ZYRTEC) 10 MG tablet; Take 1 tablet (10 mg total) by mouth daily.  Essential hypertension -     hydrochlorothiazide (HYDRODIURIL) 12.5 MG tablet; Take 1 tablet (12.5 mg total) by mouth daily.  Gastroesophageal reflux disease, esophagitis presence not specified -     ranitidine (ZANTAC) 300 MG tablet; Take 1 tablet (300 mg total) by mouth at bedtime.   No orders of the defined types were placed in this encounter.   Follow-up: Return in about 4 weeks (around 01/06/2016) for headaches, HTN, GERD .   Boykin Nearing MD

## 2016-01-14 ENCOUNTER — Ambulatory Visit: Payer: Medicare Other | Attending: Family Medicine | Admitting: Internal Medicine

## 2016-01-14 VITALS — BP 129/79 | HR 72 | Temp 98.0°F | Ht <= 58 in | Wt 129.6 lb

## 2016-01-14 DIAGNOSIS — J4521 Mild intermittent asthma with (acute) exacerbation: Secondary | ICD-10-CM | POA: Diagnosis not present

## 2016-01-14 DIAGNOSIS — R51 Headache: Secondary | ICD-10-CM | POA: Diagnosis not present

## 2016-01-14 DIAGNOSIS — J4520 Mild intermittent asthma, uncomplicated: Secondary | ICD-10-CM

## 2016-01-14 DIAGNOSIS — I10 Essential (primary) hypertension: Secondary | ICD-10-CM | POA: Insufficient documentation

## 2016-01-14 DIAGNOSIS — K219 Gastro-esophageal reflux disease without esophagitis: Secondary | ICD-10-CM | POA: Diagnosis not present

## 2016-01-14 MED ORDER — RANITIDINE HCL 300 MG PO TABS: 300.0000 mg | ORAL_TABLET | Freq: Every day | ORAL | 0 refills | Status: DC

## 2016-01-14 MED ORDER — ALBUTEROL SULFATE HFA 108 (90 BASE) MCG/ACT IN AERS: 2.0000 | INHALATION_SPRAY | Freq: Four times a day (QID) | RESPIRATORY_TRACT | 5 refills | Status: DC | PRN

## 2016-01-14 MED ORDER — PREDNISONE 20 MG PO TABS: 40.0000 mg | ORAL_TABLET | Freq: Every day | ORAL | 0 refills | Status: DC

## 2016-01-14 MED ORDER — FLUTICASONE-SALMETEROL 100-50 MCG/DOSE IN AEPB: 1.0000 | INHALATION_SPRAY | Freq: Two times a day (BID) | RESPIRATORY_TRACT | 11 refills | Status: DC

## 2016-01-14 MED ORDER — FLUTICASONE PROPIONATE 50 MCG/ACT NA SUSP: 2.0000 | Freq: Every day | NASAL | 11 refills | Status: DC

## 2016-01-14 MED ORDER — LISINOPRIL 2.5 MG PO TABS: 2.5000 mg | ORAL_TABLET | Freq: Every day | ORAL | 3 refills | Status: DC

## 2016-01-14 MED FILL — ADVAIR 100/50 DISKUS: 100-50 | 30 days supply | Qty: 60 | Fill #0

## 2016-01-14 MED FILL — LISINOPRIL 2.5 MG TABLET: 2.5 | 30 days supply | Qty: 30 | Fill #0

## 2016-01-14 MED FILL — predniSONE 20 MG TABS: 20 | 7 days supply | Qty: 10 | Fill #0

## 2016-01-14 MED FILL — FLUTICASONE PROP 50 MCG SPR: 50 | 30 days supply | Qty: 16 | Fill #0

## 2016-01-14 MED FILL — raNITIdine HCL 150 MG TABS: 150 | 30 days supply | Qty: 60 | Fill #0

## 2016-01-14 MED FILL — PROAIR HFA 90 MCG INHALER: 108 (90 BAS | 20 days supply | Qty: 9 | Fill #0

## 2016-01-14 NOTE — Patient Instructions (Signed)
T?ng huy?t áp °(Hypertension) °T?ng huy?t áp, th??ng ???c g?i là huy?t áp cao, là khi l?c b?m máu qua ??ng m?ch c?a quý v? quá m?nh. ??ng m?ch c?a quý v? là các m?ch máu mang máu t? tim ?i kh?p c? th? c?a quý v?. K?t qu? ?o huy?t áp có m?t con s? cao và m?t con s? th?p, ch?ng h?n nh? 110/72. Con s? cao (tâm thu) là áp l?c bên trong ??ng m?ch khi tim quý v? b?m. Con s? th?p (tâm tr??ng) là áp l?c bên trong ??ng m?ch khi tim quý v? giãn ra. Huy?t áp lý t??ng c?n cho quý v? ph?i d??i 120/80. °Ch?ng t?ng huy?t áp bu?c tim quý v? ph?i làm vi?c v?t v? h?n ?? b?m máu. ??ng m?ch c?a quý v? có th? b? h?p ho?c c?ng. Huy?t áp cao không ???c ?i?u tr? ho?c không ???c ki?m soát có th? d?n t?i nh?i máu c? tim, ??t qu?, b?nh th?n và nh?ng v?n ?? khác. °CÁC Y?U T? NGUY C? °M?t s? y?u t? nguy c? d?n ??n huy?t áp cao có th? ki?m soát ???c. M?t s? y?u t? khác thì không.  °Nh?ng y?u t? nguy c? không th? ki?m soát ???c bao g?m:  °· Ch?ng t?c. Quý v? có nguy c? cao h?n n?u quý v? là ng??i M? g?c Phi. °· ?? tu?i. Nguy c? t?ng lên theo ?? tu?i. °· Gi?i tính. Nam gi?i có nguy c? cao h?n ph? n? tr??c tu?i 45. Sau tu?i 65, ph? n? có nguy c? cao h?n nam gi?i. °Nh?ng y?u t? nguy c? có th? ki?m soát ???c bao g?m: °· Không t?p th? d?c ho?c các ho?t ??ng th? ch?t ??y ??. °· Th?a cân. °· ?n quá nhi?u ch?t béo, ???ng, ca-lo, ho?c mu?i. °· U?ng quá nhi?u r??u. °D?U HI?U VÀ TRI?U CH?NG °T?ng huy?t áp th??ng không gây ra d?u hi?u ho?c tri?u ch?ng. Huy?t áp r?t cao (c?n cao huy?t áp) có th? gây ?au ??u, lo l?ng, khó th? và ch?y máu cam. °CH?N ?OÁN °?? ki?m tra xem quý v? có t?ng huy?t áp không, chuyên gia ch?m sóc s?c kh?e c?a quý v? s? ?o huy?t áp trong khi quý v? ng?i ??t tay ? m?c ngang v?i tim. Huy?t áp c?n ???c ?o ít nh?t hai l?n trên cùng m?t cánh tay. M?t s? tình tr?ng nh?t ??nh có th? làm cho huy?t áp khác nhau gi?a tay ph?i và tay trái c?a quý v?. K?t qu? ?o huy?t áp cao h?n bình th??ng ? m?t th?i ?i?m nào ?ó không có ngh?a là quý v? c?n ?i?u  tr?. N?u không rõ li?u quý v? có huy?t áp cao hay không, quý v? có th? ???c ?? ngh? tr? l?i vào m?t ngày khác ?? ki?m tra l?i huy?t áp. Ho?c quý v? có th? ???c yêu c?u theo dõi huy?t áp ? nhà trong 1 tu?n ho?c h?n. °?I?U TR? °?i?u tr? huy?t áp cao gao g?m thay ??i l?i s?ng và có th? ph?i dùng thu?c. Có m?t l?i s?ng lành m?nh có th? giúp làm gi?m huy?t áp cao. Quý v? có th? c?n thay ??i m?t s? thói quen. °Thay ??i l?i s?ng có th? bao g?m: °· Th?c hi?n ch? ?? ?n DASH. Ch? ?? ?n này có nhi?u trái cây, rau và ng? c?c nguyên h?t. Có ít mu?i, th?t ??, và ít b? sung ???ng. °· Duy trì l??ng mu?i tiêu th? d??i 2.300 mg m?i ngày. °· T?p aerobic ít nh?t 30-45 phút ít   nh?t 4 l?n m?i tu?n. °· Gi?m cân n?u c?n thi?t. °· Không hút thu?c. °· H?n ch? ?? u?ng có c?n. °· H?c các cách gi?m c?ng th?ng. °Chuyên gia ch?m sóc s?c kh?e có th? kê ??n thu?c n?u thay ??i l?i s?ng không ?? ?? ??a huy?t áp v? m?c có th? ki?m soát ???c và n?u m?t trong nh?ng ?i?u sau là ?úng: °· Quý v? t? 18-59 tu?i và huy?t áp tâm thu c?a quý v? trên 140. °· Quý v? t? 60 tu?i tr? lên và huy?t áp tâm thu c?a quý v? trên 150. °· Huy?t áp tâm tr??ng c?a quý v? trên 90. °· Quý v? b? ti?u ???ng và huy?t áp tâm thu c?a quý v? trên 140 ho?c huy?t áp tâm tr??ng c?a quý v? trên 90. °· Quý v? b? b?nh th?n và huy?t áp quý v? trên 140/90. °· Quý v? b? b?nh tim và huy?t áp quý v? trên 140/90. °Huy?t áp m?c tiêu cá nhân c?a quý v? có th? khác nhau tùy thu?c và tình tr?ng b?nh lý, tu?i và các nhân t? khác. °H??NG D?N CH?M SÓC T?I NHÀ °· Ki?m tra l?i huy?t áp c?a quý v? theo ch? d?n c?a chuyên gia ch?m sóc s?c kh?e. °· Ch? s? d?ng thu?c theo ch? d?n c?a chuyên gia ch?m sóc s?c kh?e. Làm theo ch? d?n m?t cách c?n th?n. Thu?c ?i?u tr? huy?t áp ph?i ???c dùng theo ??n ?ã kê. Thu?c c?ng s? không có tác d?ng khi quý v? b? li?u. Vi?c b? li?u thu?c c?ng làm quý v? có nguy c? phát sinh v?n ??. °· Không hút thu?c. °· Theo dõi huy?t áp c?a quý v? ? nhà theo ch? d?n c?a chuyên gia ch?m  sóc s?c kh?e. °?I KHÁM N?U:  °· Quý v? ngh? quý v? có ph?n ?ng v?i thu?c ?ang dùng. °· Quý v? b? ?au ??u ho?c c?m th?y chóng m?t tái di?n. °· Quý v? b? s?ng phù ? m?t cá chân. °· Quý v? có v?n ?? v? th? l?c. °NGAY L?P T?C ?I KHÁM N?U: °· Quý v? b? ?au ??u n?ng ho?c lú l?n. °· Quý v? b? y?u b?t th??ng, tê bì, ho?c c?m th?y nh? ng?t x?u. °· Quý v? b? ?au ng?c ho?c ?au b?ng r?t nhi?u. °· Quý v? nôn nhi?u l?n. °· Quý v? b? khó th?. °??M B?O QUÝ V?:  °· Hi?u rõ các h??ng d?n này. °· S? theo dõi tình tr?ng c?a mình. °· S? yêu c?u tr? giúp ngay l?p t?c n?u quý v? c?m th?y không kh?e ho?c th?y tr?m tr?ng h?n. °  °Thông tin này không nh?m m?c ?ích thay th? cho l?i khuyên mà chuyên gia ch?m sóc s?c kh?e nói v?i quý v?. Hãy b?o ??m quý v? ph?i th?o lu?n b?t k? v?n ?? gì mà quý v? có v?i chuyên gia ch?m sóc s?c kh?e c?a quý v?. °  °Document Released: 02/23/2005 Document Revised: 11/14/2014 °[employer] Interactive Patient Education ©2016 [employer] ° °

## 2016-01-14 NOTE — Progress Notes (Signed)
Tina [name], is a 60 y.o. female  [id number]  [id number]  DOB - [birth year]  Chief Complaint  Patient presents with  . Hypertension        Subjective:   Tina [name] is a 60 y.o. female speaks [place], but also can speak [place], here today for a follow up visit. Last seen 10/2 for asthma/gerd/htn/headache, back today for f/u.  She is here w/ her husband. She denies headaches currently, no abd pains now, resolved w/ ranitidine.  She ran out of her inhalers last 2-3 days, and needs refills.  She states could not take hctz, took it for 3 days and it caused her to have breathing problems. Couldn't breath. She has since stopped and breathing normal again.  Of note, noted throat soreness/tightness last few days though. Denies dob/sob/syncope.   Patient has No headache, No chest pain, No abdominal pain - No Nausea, No new weakness tingling or numbness, No Cough - SOB.  No problems updated.  ALLERGIES: No Known Allergies  PAST MEDICAL HISTORY: Past Medical History:  Diagnosis Date  . Asthma   . Nephrolithiasis    "came out on it's own" (12/21/2012)    MEDICATIONS AT HOME: Prior to Admission medications   Medication Sig Start Date End Date Taking? Authorizing Provider  albuterol (PROAIR HFA) 108 (90 Base) MCG/ACT inhaler Inhale 2 puffs into the lungs every 6 (six) hours as needed for wheezing or shortness of breath. 01/14/16  Yes Maren Reamer, MD  cetirizine (ZYRTEC) 10 MG tablet Take 1 tablet (10 mg total) by mouth daily. 12/09/15  Yes Josalyn Funches, MD  fluticasone (FLONASE) 50 MCG/ACT nasal spray Place 2 sprays into both nostrils daily. 01/14/16  Yes Maren Reamer, MD  Fluticasone-Salmeterol (ADVAIR) 100-50 MCG/DOSE AEPB Inhale 1 puff into the lungs 2 (two) times daily. 01/14/16  Yes Maren Reamer, MD  ranitidine (ZANTAC) 300 MG tablet Take 1 tablet (300 mg total) by mouth at bedtime. 01/14/16  Yes Maren Reamer, MD  lisinopril (ZESTRIL) 2.5 MG tablet  Take 1 tablet (2.5 mg total) by mouth daily. 01/14/16   Maren Reamer, MD  polyethylene glycol powder (GLYCOLAX/MIRALAX) powder Take 17 g by mouth daily. Patient not taking: Reported on 01/14/2016 07/08/15   Boykin Nearing, MD  predniSONE (DELTASONE) 20 MG tablet Take 2 tablets (40 mg total) by mouth daily with breakfast. Day 1-3 take 2 tabs (= 40mg ) po qAm, day 4-7 take 1 tab (=20mg  ) than stop. 01/14/16   Maren Reamer, MD     Objective:   Vitals:   01/14/16 1541  BP: 129/79  Pulse: 72  Temp: 98 F (36.7 C)  TempSrc: Oral  SpO2: 96%  Weight: 129 lb 9.6 oz (58.8 kg)  Height: 4\' 10"  (1.473 m)    Exam General appearance : Awake, alert, not in any distress. Speech Clear. Not toxic looking, pleasant. HEENT: Atraumatic and Normocephalic, pupils equally reactive to light. bilat TMs clear Neck: supple, no JVD. No cervical lymphadenopathy.   Wheezing noted bilat neck, no stridor. Chest:Good air entry bilaterally, small exp wheezing noted diffusely.  No c/r. CVS: S1 S2 regular, no murmurs/gallups or rubs. Abdomen: Bowel sounds active, Non tender and not distended with no gaurding, rigidity or rebound. Extremities: B/L Lower Ext shows no edema, both legs are warm to touch Neurology: Awake alert, and oriented X 3, CN II-XII grossly intact, Non focal Skin:No Rash  Data Review Lab Results  Component Value Date   HGBA1C 6.20 04/04/2015  Depression screen [facility] 2/9 12/09/2015 07/08/2015 04/04/2015 02/22/2014  Decreased Interest 3 0 0 -  Down, Depressed, Hopeless 0 0 0 1  PHQ - 2 Score 3 0 0 1  Altered sleeping 3 0 - -  Tired, decreased energy 3 0 - -  Change in appetite 2 0 - -  Feeling bad or failure about yourself  0 0 - -  Trouble concentrating 2 0 - -  Moving slowly or fidgety/restless 2 0 - -  Suicidal thoughts 0 0 - -  PHQ-9 Score 15 0 - -      Assessment & Plan   1. Essential hypertension - could not tol hctz due to sob/chest tightness  - added to allergy list. -  trial lisinopril 2.5 qday  - low salt diet  discussed  2. Gastroesophageal reflux disease, esophagitis presence not specified Resolved w/ zantac, avoid gerd inducing food. - ranitidine (ZANTAC) 300 MG tablet; Take 1 tablet (300 mg total) by mouth at bedtime.  Dispense: 30 tablet; Refill: 0  3. Mild intermittent asthma without complication - mild exacerbation today. - ooc currently, reiterated w/ pt and husband that when runs low on meds, call pharmacy to pick up prior to running out of meds.  - pick up inhalers and resume - short steroid course in the meantime  For mile exacerbation today. - fluticasone (FLONASE) 50 MCG/ACT nasal spray; Place 2 sprays into both nostrils daily.  Dispense: 16 g; Refill: 11 - albuterol (PROAIR HFA) 108 (90 Base) MCG/ACT inhaler; Inhale 2 puffs into the lungs every 6 (six) hours as needed for wheezing or shortness of breath.  Dispense: 8 g; Refill: 5 - Fluticasone-Salmeterol (ADVAIR) 100-50 MCG/DOSE AEPB; Inhale 1 puff into the lungs 2 (two) times daily.  Dispense: 1 each; Refill: 11  4. Headaches - appears resolve.  5. Pt speak [place], but also speaks and understands some [place]. We did not have an interpreter, but made it through ok w/ [place].  6. Colonoscopy screening - pt says had 1 last year, we do not have records, asked if brings in Drs name or report so we can document on our end.  7. Def tdap for now.  Patient have been counseled extensively about nutrition and exercise  Return in about 3 months (around 04/15/2016), or if symptoms worsen or fail to improve.  The patient was given clear instructions to go to ER or return to medical center if symptoms don't improve, worsen or new problems develop. The patient verbalized understanding. The patient was told to call to get lab results if they haven't heard anything in the next week.   This note has been created with Surveyor, quantity. Any  transcriptional errors are unintentional.   Maren Reamer, MD, [facility] [place], [place]   01/14/2016, 4:19 PM

## 2016-02-26 MED FILL — ADVAIR 100/50 DISKUS: 100-50 | 30 days supply | Qty: 60 | Fill #1

## 2016-02-26 MED FILL — PROAIR HFA 90 MCG INHALER: 108 (90 BAS | 20 days supply | Qty: 9 | Fill #1

## 2016-03-17 ENCOUNTER — Other Ambulatory Visit: Payer: Self-pay | Admitting: Family Medicine

## 2016-03-17 ENCOUNTER — Telehealth: Payer: Self-pay | Admitting: Internal Medicine

## 2016-03-17 DIAGNOSIS — N644 Mastodynia: Secondary | ICD-10-CM

## 2016-03-17 NOTE — Telephone Encounter (Signed)
Patient needs diagnostic mammogram to treat soreness of breast on the right side.  Please forward [facility].   Please follow up with community liason.

## 2016-04-01 MED FILL — PROAIR HFA 90 MCG INHALER: 108 (90 BAS | 20 days supply | Qty: 9 | Fill #2

## 2016-04-01 MED FILL — FLUTICASONE PROP 50 MCG SPR: 50 | 30 days supply | Qty: 16 | Fill #1

## 2016-04-01 MED FILL — ADVAIR 100/50 DISKUS: 100-50 | 30 days supply | Qty: 60 | Fill #2

## 2016-04-13 ENCOUNTER — Other Ambulatory Visit: Payer: Self-pay | Admitting: Internal Medicine

## 2016-04-13 MED ORDER — ALBUTEROL SULFATE HFA 108 (90 BASE) MCG/ACT IN AERS: 2.0000 | INHALATION_SPRAY | Freq: Four times a day (QID) | RESPIRATORY_TRACT | 3 refills | Status: DC | PRN

## 2016-04-13 MED FILL — VENTOLIN HFA 90 MCG INHALER: 108 (90 BAS | 25 days supply | Qty: 18 | Fill #0

## 2016-05-26 ENCOUNTER — Ambulatory Visit: Payer: Medicare Other | Attending: Internal Medicine | Admitting: Internal Medicine

## 2016-05-26 ENCOUNTER — Encounter: Payer: Self-pay | Admitting: Internal Medicine

## 2016-05-26 VITALS — BP 145/88 | HR 75 | Temp 98.7°F | Resp 16 | Wt 127.0 lb

## 2016-05-26 DIAGNOSIS — Z23 Encounter for immunization: Secondary | ICD-10-CM | POA: Diagnosis not present

## 2016-05-26 DIAGNOSIS — Z7952 Long term (current) use of systemic steroids: Secondary | ICD-10-CM | POA: Diagnosis not present

## 2016-05-26 DIAGNOSIS — R7989 Other specified abnormal findings of blood chemistry: Secondary | ICD-10-CM

## 2016-05-26 DIAGNOSIS — Z7984 Long term (current) use of oral hypoglycemic drugs: Secondary | ICD-10-CM | POA: Diagnosis not present

## 2016-05-26 DIAGNOSIS — E559 Vitamin D deficiency, unspecified: Secondary | ICD-10-CM | POA: Diagnosis not present

## 2016-05-26 DIAGNOSIS — I10 Essential (primary) hypertension: Secondary | ICD-10-CM | POA: Diagnosis not present

## 2016-05-26 DIAGNOSIS — J4540 Moderate persistent asthma, uncomplicated: Secondary | ICD-10-CM

## 2016-05-26 DIAGNOSIS — R946 Abnormal results of thyroid function studies: Secondary | ICD-10-CM

## 2016-05-26 DIAGNOSIS — Z87442 Personal history of urinary calculi: Secondary | ICD-10-CM | POA: Diagnosis not present

## 2016-05-26 DIAGNOSIS — K219 Gastro-esophageal reflux disease without esophagitis: Secondary | ICD-10-CM | POA: Diagnosis not present

## 2016-05-26 DIAGNOSIS — Z131 Encounter for screening for diabetes mellitus: Secondary | ICD-10-CM

## 2016-05-26 LAB — CBC WITH DIFFERENTIAL/PLATELET
Basophils Absolute: 0 cells/uL (ref 0–200)
Basophils Relative: 0 %
EOS PCT: 11 %
Eosinophils Absolute: 770 cells/uL — ABNORMAL HIGH (ref 15–500)
HCT: 42.5 % (ref 35.0–45.0)
HEMOGLOBIN: 14 g/dL (ref 11.7–15.5)
LYMPHS ABS: 2590 {cells}/uL (ref 850–3900)
Lymphocytes Relative: 37 %
MCH: 25 pg — ABNORMAL LOW (ref 27.0–33.0)
MCHC: 32.9 g/dL (ref 32.0–36.0)
MCV: 76 fL — ABNORMAL LOW (ref 80.0–100.0)
MPV: 11.1 fL (ref 7.5–12.5)
Monocytes Absolute: 280 cells/uL (ref 200–950)
Monocytes Relative: 4 %
NEUTROS ABS: 3360 {cells}/uL (ref 1500–7800)
Neutrophils Relative %: 48 %
Platelets: 215 10*3/uL (ref 140–400)
RBC: 5.59 MIL/uL — ABNORMAL HIGH (ref 3.80–5.10)
RDW: 14.9 % (ref 11.0–15.0)
WBC: 7 10*3/uL (ref 3.8–10.8)

## 2016-05-26 LAB — CMP AND LIVER
ALBUMIN: 4.7 g/dL (ref 3.6–5.1)
ALK PHOS: 72 U/L (ref 33–130)
ALT: 23 U/L (ref 6–29)
AST: 20 U/L (ref 10–35)
BILIRUBIN INDIRECT: 0.4 mg/dL (ref 0.2–1.2)
BUN: 13 mg/dL (ref 7–25)
Bilirubin, Direct: 0.1 mg/dL (ref <=0.2)
CO2: 27 mmol/L (ref 20–31)
Calcium: 9.5 mg/dL (ref 8.6–10.4)
Chloride: 106 mmol/L (ref 98–110)
Creat: 0.98 mg/dL (ref 0.50–0.99)
Glucose, Bld: 101 mg/dL — ABNORMAL HIGH (ref 65–99)
POTASSIUM: 4.1 mmol/L (ref 3.5–5.3)
Sodium: 141 mmol/L (ref 135–146)
Total Bilirubin: 0.5 mg/dL (ref 0.2–1.2)
Total Protein: 7.5 g/dL (ref 6.1–8.1)

## 2016-05-26 LAB — LIPID PANEL
CHOLESTEROL: 223 mg/dL — AB (ref <200)
HDL: 39 mg/dL — ABNORMAL LOW (ref >50)
LDL Cholesterol: 141 mg/dL — ABNORMAL HIGH (ref <100)
Total CHOL/HDL Ratio: 5.7 Ratio — ABNORMAL HIGH (ref <5.0)
Triglycerides: 216 mg/dL — ABNORMAL HIGH (ref <150)
VLDL: 43 mg/dL — AB (ref <30)

## 2016-05-26 LAB — POCT GLYCOSYLATED HEMOGLOBIN (HGB A1C): Hemoglobin A1C: 6.1

## 2016-05-26 LAB — TSH: TSH: 3.91 mIU/L

## 2016-05-26 MED ORDER — METFORMIN HCL ER 500 MG PO TB24: 500.0000 mg | ORAL_TABLET | Freq: Every day | ORAL | 3 refills | Status: DC

## 2016-05-26 MED ORDER — MONTELUKAST SODIUM 10 MG PO TABS: 10.0000 mg | ORAL_TABLET | Freq: Every day | ORAL | 3 refills | Status: DC

## 2016-05-26 MED ORDER — LISINOPRIL 2.5 MG PO TABS: 2.5000 mg | ORAL_TABLET | Freq: Every day | ORAL | 3 refills | Status: DC

## 2016-05-26 MED ORDER — FLUTICASONE-SALMETEROL 250-50 MCG/DOSE IN AEPB: 1.0000 | INHALATION_SPRAY | Freq: Two times a day (BID) | RESPIRATORY_TRACT | 1 refills | Status: DC

## 2016-05-26 MED ORDER — ALBUTEROL SULFATE HFA 108 (90 BASE) MCG/ACT IN AERS: 2.0000 | INHALATION_SPRAY | Freq: Four times a day (QID) | RESPIRATORY_TRACT | 11 refills | Status: DC | PRN

## 2016-05-26 MED FILL — VENTOLIN HFA 90 MCG INHALER: 108 (90 BAS | 25 days supply | Qty: 18 | Fill #0

## 2016-05-26 MED FILL — MONTELUKAST SOD 10 MG TAB: 10 | 30 days supply | Qty: 30 | Fill #0

## 2016-05-26 MED FILL — METFORMIN HCL ER 500 MG TAB: 500 | 90 days supply | Qty: 90 | Fill #0

## 2016-05-26 MED FILL — LISINOPRIL 2.5 MG TABLET: 2.5 | 30 days supply | Qty: 30 | Fill #0

## 2016-05-26 MED FILL — ADVAIR 250/50 DISKUS: 250-50 | 30 days supply | Qty: 60 | Fill #0

## 2016-05-26 NOTE — Patient Instructions (Addendum)
Calcium 1200mg / day  - for bone health./   Low-Sodium Eating Plan Sodium, which is an element that makes up salt, helps you maintain a healthy balance of fluids in your body. Too much sodium can increase your blood pressure and cause fluid and waste to be held in your body. Your health care provider or dietitian may recommend following this plan if you have high blood pressure (hypertension), kidney disease, liver disease, or heart failure. Eating less sodium can help lower your blood pressure, reduce swelling, and protect your heart, liver, and kidneys. What are tips for following this plan? General guidelines   Most people on this plan should limit their sodium intake to 1,500-2,000 mg (milligrams) of sodium each day. Reading food labels   The Nutrition Facts label lists the amount of sodium in one serving of the food. If you eat more than one serving, you must multiply the listed amount of sodium by the number of servings.  Choose foods with less than 140 mg of sodium per serving.  Avoid foods with 300 mg of sodium or more per serving. Shopping   Look for lower-sodium products, often labeled as "low-sodium" or "no salt added."  Always check the sodium content even if foods are labeled as "unsalted" or "no salt added".  Buy fresh foods.  Avoid canned foods and premade or frozen meals.  Avoid canned, cured, or processed meats  Buy breads that have less than 80 mg of sodium per slice. Cooking   Eat more home-cooked food and less restaurant, buffet, and fast food.  Avoid adding salt when cooking. Use salt-free seasonings or herbs instead of table salt or sea salt. Check with your health care provider or pharmacist before using salt substitutes.  Cook with plant-based oils, such as canola, sunflower, or olive oil. Meal planning   When eating at a restaurant, ask that your food be prepared with less salt or no salt, if possible.  Avoid foods that contain MSG (monosodium  glutamate). MSG is sometimes added to [place] food, bouillon, and some canned foods. What foods are recommended? The items listed may not be a complete list. Talk with your dietitian about what dietary choices are best for you. Grains  Low-sodium cereals, including oats, puffed wheat and rice, and shredded wheat. Low-sodium crackers. Unsalted rice. Unsalted pasta. Low-sodium bread. Whole-grain breads and whole-grain pasta. Vegetables  Fresh or frozen vegetables. "No salt added" canned vegetables. "No salt added" tomato sauce and paste. Low-sodium or reduced-sodium tomato and vegetable juice. Fruits  Fresh, frozen, or canned fruit. Fruit juice. Meats and other protein foods  Fresh or frozen (no salt added) meat, poultry, seafood, and fish. Low-sodium canned tuna and salmon. Unsalted nuts. Dried peas, beans, and lentils without added salt. Unsalted canned beans. Eggs. Unsalted nut butters. Dairy  Milk. Soy milk. Cheese that is naturally low in sodium, such as ricotta cheese, fresh mozzarella, or Swiss cheese Low-sodium or reduced-sodium cheese. Cream cheese. Yogurt. Fats and oils  Unsalted butter. Unsalted margarine with no trans fat. Vegetable oils such as canola or olive oils. Seasonings and other foods  Fresh and dried herbs and spices. Salt-free seasonings. Low-sodium mustard and ketchup. Sodium-free salad dressing. Sodium-free light mayonnaise. Fresh or refrigerated horseradish. Lemon juice. Vinegar. Homemade, reduced-sodium, or low-sodium soups. Unsalted popcorn and pretzels. Low-salt or salt-free chips. What foods are not recommended? The items listed may not be a complete list. Talk with your dietitian about what dietary choices are best for you. Grains  Instant hot cereals. Bread  stuffing, pancake, and biscuit mixes. Croutons. Seasoned rice or pasta mixes. Noodle soup cups. Boxed or frozen macaroni and cheese. Regular salted crackers. Self-rising flour. Vegetables  Sauerkraut, pickled  vegetables, and relishes. Olives. [place] fries. Onion rings. Regular canned vegetables (not low-sodium or reduced-sodium). Regular canned tomato sauce and paste (not low-sodium or reduced-sodium). Regular tomato and vegetable juice (not low-sodium or reduced-sodium). Frozen vegetables in sauces. Meats and other protein foods  Meat or fish that is salted, canned, smoked, spiced, or pickled. Bacon, ham, sausage, hotdogs, corned beef, chipped beef, packaged lunch meats, salt pork, jerky, pickled herring, anchovies, regular canned tuna, sardines, salted nuts. Dairy  Processed cheese and cheese spreads. Cheese curds. Blue cheese. Feta cheese. String cheese. Regular cottage cheese. Buttermilk. Canned milk. Fats and oils  Salted butter. Regular margarine. Ghee. Bacon fat. Seasonings and other foods  Onion salt, garlic salt, seasoned salt, table salt, and sea salt. Canned and packaged gravies. Worcestershire sauce. Tartar sauce. Barbecue sauce. Teriyaki sauce. Soy sauce, including reduced-sodium. Steak sauce. Fish sauce. Oyster sauce. Cocktail sauce. Horseradish that you find on the shelf. Regular ketchup and mustard. Meat flavorings and tenderizers. Bouillon cubes. Hot sauce and Tabasco sauce. Premade or packaged marinades. Premade or packaged taco seasonings. Relishes. Regular salad dressings. Salsa. Potato and tortilla chips. Corn chips and puffs. Salted popcorn and pretzels. Canned or dried soups. Pizza. Frozen entrees and pot pies. Summary  Eating less sodium can help lower your blood pressure, reduce swelling, and protect your heart, liver, and kidneys.  Most people on this plan should limit their sodium intake to 1,500-2,000 mg (milligrams) of sodium each day.  Canned, boxed, and frozen foods are high in sodium. Restaurant foods, fast foods, and pizza are also very high in sodium. You also get sodium by adding salt to food.  Try to cook at home, eat more fresh fruits and vegetables, and eat less fast  food, canned, processed, or prepared foods. This information is not intended to replace advice given to you by your health care provider. Make sure you discuss any questions you have with your health care provider. Document Released: 08/15/2001 Document Revised: 02/17/2016 Document Reviewed: 02/17/2016 Elsevier Interactive Patient Education  2017 Elsevier Inc.  -  Preventing Type 2 Diabetes Mellitus Type 2 diabetes (type 2 diabetes mellitus) is a long-term (chronic) disease that affects blood sugar (glucose) levels. Normally, a hormone called insulin allows glucose to enter cells in the body. The cells use glucose for energy. In type 2 diabetes, one or both of these problems may be present:  The body does not make enough insulin.  The body does not respond properly to insulin that it makes (insulin resistance). Insulin resistance or lack of insulin causes excess glucose to build up in the blood instead of going into cells. As a result, high blood glucose (hyperglycemia) develops, which can cause many complications. Being overweight or obese and having an inactive (sedentary) lifestyle can increase your risk for diabetes. Type 2 diabetes can be delayed or prevented by making certain nutrition and lifestyle changes. What nutrition changes can be made?  Eat healthy meals and snacks regularly. Keep a healthy snack with you for when you get hungry between meals, such as fruit or a handful of nuts.  Eat lean meats and proteins that are low in saturated fats, such as chicken, fish, egg whites, and beans. Avoid processed meats.  Eat plenty of fruits and vegetables and plenty of grains that have not been processed (whole grains). It is recommended that  you eat:  1?2 cups of fruit every day.  2?3 cups of vegetables every day.  6?8 oz of whole grains every day, such as oats, whole wheat, bulgur, brown rice, quinoa, and millet.  Eat low-fat dairy products, such as milk, yogurt, and cheese.  Eat  foods that contain healthy fats, such as nuts, avocado, olive oil, and canola oil.  Drink water throughout the day. Avoid drinks that contain added sugar, such as soda or sweet tea.  Follow instructions from your health care provider about specific eating or drinking restrictions.  Control how much food you eat at a time (portion size).  Check food labels to find out the serving sizes of foods.  Use a kitchen scale to weigh amounts of foods.  Saute or steam food instead of frying it. Cook with water or broth instead of oils or butter.  Limit your intake of:  Salt (sodium). Have no more than 1 tsp (2,400 mg) of sodium a day. If you have heart disease or high blood pressure, have less than ? tsp (1,500 mg) of sodium a day.  Saturated fat. This is fat that is solid at room temperature, such as butter or fat on meat. What lifestyle changes can be made?   Activity   Do moderate-intensity physical activity for at least 30 minutes on at least 5 days of the week, or as much as told by your health care provider.  Ask your health care provider what activities are safe for you. A mix of physical activities may be best, such as walking, swimming, cycling, and strength training.  Try to add physical activity into your day. For example:  Park in spots that are farther away than usual, so that you walk more. For example, park in a far corner of the parking lot when you go to the office or the grocery store.  Take a walk during your lunch break.  Use stairs instead of elevators or escalators. Weight Loss   Lose weight as directed. Your health care provider can determine how much weight loss is best for you and can help you lose weight safely.  If you are overweight or obese, you may be instructed to lose at least 5?7 % of your body weight. Alcohol and Tobacco      Do not use any tobacco products, such as cigarettes, chewing tobacco, and e-cigarettes. If you need help quitting, ask your  health care provider. Work With [place] Provider   Have your blood glucose tested regularly, as told by your health care provider.  Discuss your risk factors and how you can reduce your risk for diabetes.  Get screening tests as told by your health care provider. You may have screening tests regularly, especially if you have certain risk factors for type 2 diabetes.  Make an appointment with a diet and nutrition specialist (registered dietitian). A registered dietitian can help you make a healthy eating plan and can help you understand portion sizes and food labels. Why are these changes important?  It is possible to prevent or delay type 2 diabetes and related health problems by making lifestyle and nutrition changes.  It can be difficult to recognize signs of type 2 diabetes. The best way to avoid possible damage to your body is to take actions to prevent the disease before you develop symptoms. What can happen if changes are not made?  Your blood glucose levels may keep increasing. Having high blood glucose for a long time is dangerous.  Too much glucose in your blood can damage your blood vessels, heart, kidneys, nerves, and eyes.  You may develop prediabetes or type 2 diabetes. Type 2 diabetes can lead to many chronic health problems and complications, such as:  Heart disease.  Stroke.  Blindness.  Kidney disease.  Depression.  Poor circulation in the feet and legs, which could lead to surgical removal (amputation) in severe cases. Where to find support:  Ask your health care provider to recommend a registered dietitian, diabetes educator, or weight loss program.  Look for local or online weight loss groups.  Join a gym, fitness club, or outdoor activity group, such as a walking club. Where to find more information: To learn more about diabetes and diabetes prevention, visit:  American Diabetes Association (ADA): www.diabetes.[employer] of  Diabetes and Digestive and Kidney Diseases: FindSpin.nl To learn more about healthy eating, visit:  The U.S. Department of Agriculture Scientist, research (physical sciences)), Choose My Plate: http://wiley-williams.com/  Office of Disease Prevention and Health Promotion (ODPHP), Dietary Guidelines: SurferLive.at Summary  You can reduce your risk for type 2 diabetes by increasing your physical activity, eating healthy foods, and losing weight as directed.  Talk with your health care provider about your risk for type 2 diabetes. Ask about any blood tests or screening tests that you need to have. This information is not intended to replace advice given to you by your health care provider. Make sure you discuss any questions you have with your health care provider. Document Released: 06/17/2015 Document Revised: 08/01/2015 Document Reviewed: 04/16/2015 Elsevier Interactive Patient Education  2017 [place], Adult Asthma is a condition of the lungs in which the airways tighten and narrow. Asthma can make it hard to breathe. Asthma cannot be cured, but medicine and lifestyle changes can help control it. Asthma may be started (triggered) by:  Animal skin flakes (dander).  Dust.  Cockroaches.  Pollen.  Mold.  Smoke.  Cleaning products.  Hair sprays or aerosol sprays.  Paint fumes or strong smells.  Cold air, weather changes, and winds.  Crying or laughing hard.  Stress.  Certain medicines or drugs.  Foods, such as dried fruit, potato chips, and sparkling grape juice.  Infections or conditions (colds, flu).  Exercise.  Certain medical conditions or diseases.  Exercise or tiring activities. Follow these instructions at home:  Take medicine as told by your doctor.  Use a peak flow meter as told by your doctor. A peak flow meter is a tool that measures how well the lungs are working.  Record and keep track of the peak flow  meter's readings.  Understand and use the asthma action plan. An asthma action plan is a written plan for taking care of your asthma and treating your attacks.  To help prevent asthma attacks:  Do not smoke. Stay away from secondhand smoke.  Change your heating and air conditioning filter often.  Limit your use of fireplaces and wood stoves.  Get rid of pests (such as roaches and mice) and their droppings.  Throw away plants if you see mold on them.  Clean your floors. Dust regularly. Use cleaning products that do not smell.  Have someone vacuum when you are not home. Use a vacuum cleaner with a HEPA filter if possible.  Replace carpet with wood, tile, or vinyl flooring. Carpet can trap animal skin flakes and dust.  Use allergy-proof pillows, mattress covers, and box spring covers.  Wash bed sheets and blankets every week in  hot water and dry them in a dryer.  Use blankets that are made of polyester or cotton.  Clean bathrooms and kitchens with bleach. If possible, have someone repaint the walls in these rooms with mold-resistant paint. Keep out of the rooms that are being cleaned and painted.  Wash hands often. Contact a doctor if:  You have make a whistling sound when breaking (wheeze), have shortness of breath, or have a cough even if taking medicine to prevent attacks.  The colored mucus you cough up (sputum) is thicker than usual.  The colored mucus you cough up changes from clear or white to yellow, green, gray, or bloody.  You have problems from the medicine you are taking such as:  A rash.  Itching.  Swelling.  Trouble breathing.  You need reliever medicines more than 2-3 times a week.  Your peak flow measurement is still at 50-79% of your personal best after following the action plan for 1 hour.  You have a fever. Get help right away if:  You seem to be worse and are not responding to medicine during an asthma attack.  You are short of breath even at  rest.  You get short of breath when doing very little activity.  You have trouble eating, drinking, or talking.  You have chest pain.  You have a fast heartbeat.  Your lips or fingernails start to turn blue.  You are light-headed, dizzy, or faint.  Your peak flow is less than 50% of your personal best. This information is not intended to replace advice given to you by your health care provider. Make sure you discuss any questions you have with your health care provider. Document Released: 08/12/2007 Document Revised: 08/01/2015 Document Reviewed: 09/22/2012 Elsevier Interactive Patient Education  2017 [place] (Tetanus and Diphtheria): What You Need to Know 1. Why get vaccinated? Tetanus  and diphtheria are very serious diseases. They are rare in the [place] today, but people who do become infected often have severe complications. Td vaccine is used to protect adolescents and adults from both of these diseases. Both tetanus and diphtheria are infections caused by bacteria. Diphtheria spreads from person to person through coughing or sneezing. Tetanus-causing bacteria enter the body through cuts, scratches, or wounds. TETANUS (lockjaw) causes painful muscle tightening and stiffness, usually all over the body.  It can lead to tightening of muscles in the head and neck so you can't open your mouth, swallow, or sometimes even breathe. Tetanus kills about 1 out of every 10 people who are infected even after receiving the best medical care. DIPHTHERIA can cause a thick coating to form in the back of the throat.  It can lead to breathing problems, paralysis, heart failure, and death. Before vaccines, as many as 200,000 cases of diphtheria and hundreds of cases of tetanus were reported in the [place] each year. Since vaccination began, reports of cases for both diseases have dropped by about 99%. 2. Td vaccine Td vaccine can protect adolescents and adults from  tetanus and diphtheria. Td is usually given as a booster dose every 10 years but it can also be given earlier after a severe and dirty wound or burn. Another vaccine, called Tdap, which protects against pertussis in addition to tetanus and diphtheria, is sometimes recommended instead of Td vaccine. Your doctor or the person giving you the vaccine can give you more information. Td may safely be given at the same time as other vaccines. 3. Some people should  not get this vaccine  A person who has ever had a life-threatening allergic reaction after a previous dose of any tetanus or diphtheria containing vaccine, OR has a severe allergy to any part of this vaccine, should not get Td vaccine. Tell the person giving the vaccine about any severe allergies.  Talk to your doctor if you:  had severe pain or swelling after any vaccine containing diphtheria or tetanus,  ever had a condition called Guillain Barre Syndrome (GBS),  aren't feeling well on the day the shot is scheduled. 4. What are the risks from Td vaccine? With any medicine, including vaccines, there is a chance of side effects. These are usually mild and go away on their own. Serious reactions are also possible but are rare. Most people who get Td vaccine do not have any problems with it. Mild problems following Td vaccine:  (Did not interfere with activities)  Pain where the shot was given (about 8 people in 10)  Redness or swelling where the shot was given (about 1 person in 4)  Mild fever (rare)  Headache (about 1 person in 4)  Tiredness (about 1 person in 4) Moderate problems following Td vaccine:  (Interfered with activities, but did not require medical attention)  Fever over 102F (rare) Severe problems following Td vaccine:  (Unable to perform usual activities; required medical attention)  Swelling, severe pain, bleeding and/or redness in the arm where the shot was given (rare). Problems that could happen after any  vaccine:   People sometimes faint after a medical procedure, including vaccination. Sitting or lying down for about 15 minutes can help prevent fainting, and injuries caused by a fall. Tell your doctor if you feel dizzy, or have vision changes or ringing in the ears.  Some people get severe pain in the shoulder and have difficulty moving the arm where a shot was given. This happens very rarely.  Any medication can cause a severe allergic reaction. Such reactions from a vaccine are very rare, estimated at fewer than 1 in a million doses, and would happen within a few minutes to a few hours after the vaccination. As with any medicine, there is a very remote chance of a vaccine causing a serious injury or death. The safety of vaccines is always being monitored. For more information, visit: http://www.aguilar.org/ 5. What if there is a serious reaction? What should I look for?  Look for anything that concerns you, such as signs of a severe allergic reaction, very high fever, or unusual behavior. Signs of a severe allergic reaction can include hives, swelling of the face and throat, difficulty breathing, a fast heartbeat, dizziness, and weakness. These would usually start a few minutes to a few hours after the vaccination. What should I do?   If you think it is a severe allergic reaction or other emergency that can't wait, call 9-1-1 or get the person to the nearest hospital. Otherwise, call your doctor.  Afterward, the reaction should be reported to the Vaccine Adverse Event Reporting System (VAERS). Your doctor might file this report, or you can do it yourself through the VAERS web site at www.vaers.SamedayNews.es, or by calling 410-119-3235.  VAERS does not give medical advice. 6. The National Vaccine Injury Compensation Program The [employer] Vaccine Injury Compensation Program (VICP) is a federal program that was created to compensate people who may have been injured by certain vaccines. Persons  who believe they may have been injured by a vaccine can learn about the program and about  filing a claim by calling (302)375-5791 or visiting the [place] website at GoldCloset.com.ee. There is a time limit to file a claim for compensation. 7. How can I learn more?  Ask your doctor. He or she can give you the vaccine package insert or suggest other sources of information.  Call your local or state health department.  Contact the Centers for Disease Control and Prevention (CDC):  Call 820-554-0381 (1-800-CDC-INFO)  Visit CDC's website at http://hunter.com/ CDC Td Vaccine VIS (06/18/15) This information is not intended to replace advice given to you by your health care provider. Make sure you discuss any questions you have with your health care provider. Document Released: 12/21/2005 Document Revised: 11/14/2015 Document Reviewed: 11/14/2015 Elsevier Interactive Patient Education  2017 [employer].

## 2016-05-26 NOTE — Progress Notes (Addendum)
Tina [name], is a 61 y.o. female  OVF:[id number]  ACZ:[id number]  DOB - 09-15-1955  Chief Complaint  Patient presents with  . Cough  . Hypertension        Subjective:   Tina [name] is a 61 y.o. female here today for a follow up visit, last seen 01/14/16 for gerd, htn, mild intermittent asthma.  Pt states that overall she is doing well, but has had to use her ventolin inhaler daily now 2 puffs /day due to wheezing, despite advair bid.  She denies f/c, does not smoke or drink etoh.  She states she ran out of her bp medicine x 1 month now, and ran out of her zantac yesterday. She notes  Constant gnawing pain in her abd, and eating helps slightly, but not much. Denies n/v/d/constipation.   Patient has No headache, No chest pain, No Nausea, No new weakness tingling or numbness, No Cough - SOB.   An Interpreter is here to help communicate directly with patient for the entire encounter including providing detailed patient instructions.   No problems updated.  ALLERGIES: Allergies  Allergen Reactions  . Hctz [Hydrochlorothiazide] Shortness Of Breath    PAST MEDICAL HISTORY: Past Medical History:  Diagnosis Date  . Asthma   . Nephrolithiasis    "came out on it's own" (12/21/2012)    MEDICATIONS AT HOME: Prior to Admission medications   Medication Sig Start Date End Date Taking? Authorizing Provider  albuterol (PROVENTIL HFA;VENTOLIN HFA) 108 (90 Base) MCG/ACT inhaler Inhale 2 puffs into the lungs every 6 (six) hours as needed for wheezing or shortness of breath. 05/26/16   Maren Reamer, MD  cetirizine (ZYRTEC) 10 MG tablet Take 1 tablet (10 mg total) by mouth daily. 12/09/15   Josalyn Funches, MD  fluticasone (FLONASE) 50 MCG/ACT nasal spray Place 2 sprays into both nostrils daily. 01/14/16   Maren Reamer, MD  Fluticasone-Salmeterol (ADVAIR DISKUS) 250-50 MCG/DOSE AEPB Inhale 1 puff into the lungs 2 (two) times daily. 05/26/16   Maren Reamer, MD  lisinopril (ZESTRIL) 2.5  MG tablet Take 1 tablet (2.5 mg total) by mouth daily. 05/26/16   Maren Reamer, MD  metFORMIN (GLUCOPHAGE XR) 500 MG 24 hr tablet Take 1 tablet (500 mg total) by mouth daily with breakfast. 05/26/16   Maren Reamer, MD  montelukast (SINGULAIR) 10 MG tablet Take 1 tablet (10 mg total) by mouth at bedtime. 05/26/16   Maren Reamer, MD  polyethylene glycol powder (GLYCOLAX/MIRALAX) powder Take 17 g by mouth daily. Patient not taking: Reported on 01/14/2016 07/08/15   Boykin Nearing, MD  predniSONE (DELTASONE) 20 MG tablet Take 2 tablets (40 mg total) by mouth daily with breakfast. Day 1-3 take 2 tabs (= 40mg ) po qAm, day 4-7 take 1 tab (=20mg  ) than stop. Patient not taking: Reported on 05/26/2016 01/14/16   Maren Reamer, MD  ranitidine (ZANTAC) 300 MG tablet Take 1 tablet (300 mg total) by mouth at bedtime. 01/14/16   Maren Reamer, MD     Objective:   Vitals:   05/26/16 0958  BP: (!) 145/88  Pulse: 75  Resp: 16  Temp: 98.7 F (37.1 C)  TempSrc: Oral  SpO2: 97%  Weight: 127 lb (57.6 kg)    Exam General appearance : Awake, alert, not in any distress. Speech Clear. Not toxic looking, pleasant. HEENT: Atraumatic and Normocephalic, pupils equally reactive to light. bilat Tms clear. o/p clear. Neck: supple, no JVD. No cervical lymphadenopathy.  Chest:Good air  entry bilaterally, no added sounds. CVS: S1 S2 regular, no murmurs/gallups or rubs.  Abdomen: Bowel sounds active, Non tender and not distended with no gaurding, rigidity or rebound. Extremities: B/L Lower Ext shows no edema, both legs are warm to touch Neurology: Awake alert, and oriented X 3, CN II-XII grossly intact, Non focal Skin:No Rash  Data Review Lab Results  Component Value Date   HGBA1C 6.1 05/26/2016   HGBA1C 6.20 04/04/2015    Depression screen [facility] 2/9 12/09/2015 07/08/2015 04/04/2015 02/22/2014  Decreased Interest 3 0 0 -  Down, Depressed, Hopeless 0 0 0 1  PHQ - 2 Score 3 0 0 1  Altered sleeping 3 0 -  -  Tired, decreased energy 3 0 - -  Change in appetite 2 0 - -  Feeling bad or failure about yourself  0 0 - -  Trouble concentrating 2 0 - -  Moving slowly or fidgety/restless 2 0 - -  Suicidal thoughts 0 0 - -  PHQ-9 Score 15 0 - -    cscope 09/09/15, Dr Clarene Essex, [place] (will scan report) - diverticulosis in sigmoid colon and distal descending colon - few polpys  - repeat cscope in 5-10 years recd.  surg path 09/09/15 tubular adenomas and hyperplastic polpys., no high grade dysplasia or invasive malignancy  Assessment & Plan   1. Essential hypertension Uncontrolled, ran out of bp meds x 1 month, dw her to get refills when run low, she still had refills, recd low salt diet - renewed lisinopril 2.5 qd - CMP and Liver - CBC with Differential - Lipid Panel  2. Mod persistent asthma - increase advair to 250-50 bid - start singulair 10mg  qd - ventolin mdi prn.   3. Gastroesophageal reflux disease, esophagitis presence not specified - renewed zantac - H. pylori breath test  4. Abnormal thyroid blood test - TSH  5. Vitamin D deficiency, postmenopausal - VITAMIN D 25 Hydroxy (Vit-D Deficiency, Fractures) - recd calcium 1200mg / daily for now.  6. Diabetes mellitus screening  Low carb diet recd, decrease rice consumption - dw pt metformin.  Metformin helps your body process sugars better, ut can cause some indigestion/n/diarrhea, but sx usually resolved in 2 wks or so.   - will start metformin xr 500 qd, which should have less gi s/s. - POCT glycosylated hemoglobin (Hb A1C) 6.1  7. Language barrier Interpreter was present today to assist w/ care.  8. Health maintenance tdap today - had cscope last year, pt brought in report, copied and will scan in, saw [facility], recd repeat in 5-10 years cscope.  Patient have been counseled extensively about nutrition and exercise  Return in about 4 weeks (around 06/23/2016) for asthma.  The patient was given clear instructions to  go to ER or return to medical center if symptoms don't improve, worsen or new problems develop. The patient verbalized understanding. The patient was told to call to get lab results if they haven't heard anything in the next week.   This note has been created with Surveyor, quantity. Any transcriptional errors are unintentional.   Maren Reamer, MD, [facility] [place], [place]   05/26/2016, 11:46 AM

## 2016-05-27 ENCOUNTER — Other Ambulatory Visit: Payer: Self-pay | Admitting: Internal Medicine

## 2016-05-27 LAB — H. PYLORI BREATH TEST: H. pylori Breath Test: DETECTED — AB

## 2016-05-27 LAB — VITAMIN D 25 HYDROXY (VIT D DEFICIENCY, FRACTURES): Vit D, 25-Hydroxy: 15 ng/mL — ABNORMAL LOW (ref 30–100)

## 2016-05-27 MED ORDER — AMOXICILLIN 500 MG PO CAPS: 1000.0000 mg | ORAL_CAPSULE | Freq: Two times a day (BID) | ORAL | 0 refills | Status: DC

## 2016-05-27 MED ORDER — PRAVASTATIN SODIUM 40 MG PO TABS: 40.0000 mg | ORAL_TABLET | Freq: Every day | ORAL | 3 refills | Status: DC

## 2016-05-27 MED ORDER — PANTOPRAZOLE SODIUM 40 MG PO TBEC: 40.0000 mg | DELAYED_RELEASE_TABLET | Freq: Two times a day (BID) | ORAL | 0 refills | Status: DC

## 2016-05-27 MED ORDER — CLARITHROMYCIN 500 MG PO TABS: 500.0000 mg | ORAL_TABLET | Freq: Two times a day (BID) | ORAL | 0 refills | Status: DC

## 2016-05-27 MED ORDER — VITAMIN D (ERGOCALCIFEROL) 1.25 MG (50000 UNIT) PO CAPS: 50000.0000 [IU] | ORAL_CAPSULE | ORAL | 0 refills | Status: DC

## 2016-05-27 MED FILL — PANTOPRAZOLE SOD DR 40 MG T: 40 | 30 days supply | Qty: 60 | Fill #0

## 2016-05-27 MED FILL — CLARITHROMYCIN 500 MG TAB: 500 | 14 days supply | Qty: 28 | Fill #0

## 2016-05-27 MED FILL — VIT D2 1.25 MG (50,000 UNIT: 1.25 MG | 27 days supply | Qty: 4 | Fill #0

## 2016-05-27 MED FILL — PRAVASTATIN NA 40 MG TAB: 40 | 30 days supply | Qty: 30 | Fill #0

## 2016-05-27 MED FILL — AMOXICILLIN 500 MG CAPSULE: 500 | 28 days supply | Qty: 56 | Fill #0

## 2016-06-23 MED FILL — ADVAIR 250/50 DISKUS: 250-50 | 30 days supply | Qty: 60 | Fill #1

## 2016-06-23 MED FILL — LISINOPRIL 2.5 MG TABLET: 2.5 | 30 days supply | Qty: 30 | Fill #1

## 2016-06-23 MED FILL — VENTOLIN HFA 90 MCG INHALER: 108 (90 BAS | 25 days supply | Qty: 18 | Fill #1

## 2016-06-23 MED FILL — VIT D2 1.25 MG (50,000 UNIT: 1.25 MG | 27 days supply | Qty: 4 | Fill #1

## 2016-06-23 MED FILL — PRAVASTATIN NA 40 MG TAB: 40 | 30 days supply | Qty: 30 | Fill #1

## 2016-06-23 MED FILL — MONTELUKAST SOD 10 MG TAB: 10 | 30 days supply | Qty: 30 | Fill #1

## 2016-06-29 ENCOUNTER — Ambulatory Visit: Payer: Medicare Other | Attending: Internal Medicine | Admitting: Internal Medicine

## 2016-06-29 ENCOUNTER — Encounter: Payer: Self-pay | Admitting: Internal Medicine

## 2016-06-29 VITALS — BP 111/64 | HR 69 | Temp 98.1°F | Resp 18 | Ht <= 58 in | Wt 126.2 lb

## 2016-06-29 DIAGNOSIS — J4520 Mild intermittent asthma, uncomplicated: Secondary | ICD-10-CM

## 2016-06-29 DIAGNOSIS — E559 Vitamin D deficiency, unspecified: Secondary | ICD-10-CM

## 2016-06-29 DIAGNOSIS — I10 Essential (primary) hypertension: Secondary | ICD-10-CM

## 2016-06-29 MED ORDER — CETIRIZINE HCL 10 MG PO TABS: 10.0000 mg | ORAL_TABLET | Freq: Every day | ORAL | 11 refills | Status: DC

## 2016-06-29 MED ORDER — MONTELUKAST SODIUM 10 MG PO TABS: 10.0000 mg | ORAL_TABLET | Freq: Every day | ORAL | 3 refills | Status: DC

## 2016-06-29 NOTE — Patient Instructions (Addendum)
Vitamin D 5,000 IU daily + calcium '1200mg'$ / day.  -  Health Maintenance, Female Adopting a healthy lifestyle and getting preventive care can go a long way to promote health and wellness. Talk with your health care provider about what schedule of regular examinations is right for you. This is a good chance for you to check in with your provider about disease prevention and staying healthy. In between checkups, there are plenty of things you can do on your own. Experts have done a lot of research about which lifestyle changes and preventive measures are most likely to keep you healthy. Ask your health care provider for more information. Weight and diet Eat a healthy diet  Be sure to include plenty of vegetables, fruits, low-fat dairy products, and lean protein.  Do not eat a lot of foods high in solid fats, added sugars, or salt.  Get regular exercise. This is one of the most important things you can do for your health.  Most adults should exercise for at least 150 minutes each week. The exercise should increase your heart rate and make you sweat (moderate-intensity exercise).  Most adults should also do strengthening exercises at least twice a week. This is in addition to the moderate-intensity exercise. Maintain a healthy weight  Body mass index (BMI) is a measurement that can be used to identify possible weight problems. It estimates body fat based on height and weight. Your health care provider can help determine your BMI and help you achieve or maintain a healthy weight.  For females 75 years of age and older:  A BMI below 18.5 is considered underweight.  A BMI of 18.5 to 24.9 is normal.  A BMI of 25 to 29.9 is considered overweight.  A BMI of 30 and above is considered obese. Watch levels of cholesterol and blood lipids  You should start having your blood tested for lipids and cholesterol at 62 years of age, then have this test every 5 years.  You may need to have your  cholesterol levels checked more often if:  Your lipid or cholesterol levels are high.  You are older than 62 years of age.  You are at high risk for heart disease. Cancer screening Lung Cancer  Lung cancer screening is recommended for adults 90-6 years old who are at high risk for lung cancer because of a history of smoking.  A yearly low-dose CT scan of the lungs is recommended for people who:  Currently smoke.  Have quit within the past 15 years.  Have at least a 30-pack-year history of smoking. A pack year is smoking an average of one pack of cigarettes a day for 1 year.  Yearly screening should continue until it has been 15 years since you quit.  Yearly screening should stop if you develop a health problem that would prevent you from having lung cancer treatment. Breast Cancer  Practice breast self-awareness. This means understanding how your breasts normally appear and feel.  It also means doing regular breast self-exams. Let your health care provider know about any changes, no matter how small.  If you are in your 20s or 30s, you should have a clinical breast exam (CBE) by a health care provider every 1-3 years as part of a regular health exam.  If you are 28 or older, have a CBE every year. Also consider having a breast X-ray (mammogram) every year.  If you have a family history of breast cancer, talk to your health care provider about genetic  screening.  If you are at high risk for breast cancer, talk to your health care provider about having an MRI and a mammogram every year.  Breast cancer gene (BRCA) assessment is recommended for women who have family members with BRCA-related cancers. BRCA-related cancers include:  Breast.  Ovarian.  Tubal.  Peritoneal cancers.  Results of the assessment will determine the need for genetic counseling and BRCA1 and BRCA2 testing. Cervical Cancer  Your health care provider may recommend that you be screened regularly for  cancer of the pelvic organs (ovaries, uterus, and vagina). This screening involves a pelvic examination, including checking for microscopic changes to the surface of your cervix (Pap test). You may be encouraged to have this screening done every 3 years, beginning at age 34.  For women ages 27-65, health care providers may recommend pelvic exams and Pap testing every 3 years, or they may recommend the Pap and pelvic exam, combined with testing for human papilloma virus (HPV), every 5 years. Some types of HPV increase your risk of cervical cancer. Testing for HPV may also be done on women of any age with unclear Pap test results.  Other health care providers may not recommend any screening for nonpregnant women who are considered low risk for pelvic cancer and who do not have symptoms. Ask your health care provider if a screening pelvic exam is right for you.  If you have had past treatment for cervical cancer or a condition that could lead to cancer, you need Pap tests and screening for cancer for at least 20 years after your treatment. If Pap tests have been discontinued, your risk factors (such as having a new sexual partner) need to be reassessed to determine if screening should resume. Some women have medical problems that increase the chance of getting cervical cancer. In these cases, your health care provider may recommend more frequent screening and Pap tests. Colorectal Cancer  This type of cancer can be detected and often prevented.  Routine colorectal cancer screening usually begins at 62 years of age and continues through 62 years of age.  Your health care provider may recommend screening at an earlier age if you have risk factors for colon cancer.  Your health care provider may also recommend using home test kits to check for hidden blood in the stool.  A small camera at the end of a tube can be used to examine your colon directly (sigmoidoscopy or colonoscopy). This is done to check for  the earliest forms of colorectal cancer.  Routine screening usually begins at age 102.  Direct examination of the colon should be repeated every 5-10 years through 62 years of age. However, you may need to be screened more often if early forms of precancerous polyps or small growths are found. Skin Cancer  Check your skin from head to toe regularly.  Tell your health care provider about any new moles or changes in moles, especially if there is a change in a mole's shape or color.  Also tell your health care provider if you have a mole that is larger than the size of a pencil eraser.  Always use sunscreen. Apply sunscreen liberally and repeatedly throughout the day.  Protect yourself by wearing long sleeves, pants, a wide-brimmed hat, and sunglasses whenever you are outside. Heart disease, diabetes, and high blood pressure  High blood pressure causes heart disease and increases the risk of stroke. High blood pressure is more likely to develop in:  People who have blood pressure  in the high end of the normal range (130-139/85-89 mm Hg).  People who are overweight or obese.  People who are African American.  If you are 79-60 years of age, have your blood pressure checked every 3-5 years. If you are 20 years of age or older, have your blood pressure checked every year. You should have your blood pressure measured twice-once when you are at a hospital or clinic, and once when you are not at a hospital or clinic. Record the average of the two measurements. To check your blood pressure when you are not at a hospital or clinic, you can use:  An automated blood pressure machine at a pharmacy.  A home blood pressure monitor.  If you are between 30 years and 44 years old, ask your health care provider if you should take aspirin to prevent strokes.  Have regular diabetes screenings. This involves taking a blood sample to check your fasting blood sugar level.  If you are at a normal weight and  have a low risk for diabetes, have this test once every three years after 62 years of age.  If you are overweight and have a high risk for diabetes, consider being tested at a younger age or more often. Preventing infection Hepatitis B  If you have a higher risk for hepatitis B, you should be screened for this virus. You are considered at high risk for hepatitis B if:  You were born in a country where hepatitis B is common. Ask your health care provider which countries are considered high risk.  Your parents were born in a high-risk country, and you have not been immunized against hepatitis B (hepatitis B vaccine).  You have HIV or AIDS.  You use needles to inject street drugs.  You live with someone who has hepatitis B.  You have had sex with someone who has hepatitis B.  You get hemodialysis treatment.  You take certain medicines for conditions, including cancer, organ transplantation, and autoimmune conditions. Hepatitis C  Blood testing is recommended for:  Everyone born from [birth year] through 1966.  Anyone with known risk factors for hepatitis C. Sexually transmitted infections (STIs)  You should be screened for sexually transmitted infections (STIs) including gonorrhea and chlamydia if:  You are sexually active and are younger than 62 years of age.  You are older than 61 years of age and your health care provider tells you that you are at risk for this type of infection.  Your sexual activity has changed since you were last screened and you are at an increased risk for chlamydia or gonorrhea. Ask your health care provider if you are at risk.  If you do not have HIV, but are at risk, it may be recommended that you take a prescription medicine daily to prevent HIV infection. This is called pre-exposure prophylaxis (PrEP). You are considered at risk if:  You are sexually active and do not regularly use condoms or know the HIV status of your partner(s).  You take drugs by  injection.  You are sexually active with a partner who has HIV. Talk with your health care provider about whether you are at high risk of being infected with HIV. If you choose to begin PrEP, you should first be tested for HIV. You should then be tested every 3 months for as long as you are taking PrEP. Pregnancy  If you are premenopausal and you may become pregnant, ask your health care provider about preconception counseling.  If you  may become pregnant, take 400 to 800 micrograms (mcg) of folic acid every day.  If you want to prevent pregnancy, talk to your health care provider about birth control (contraception). Osteoporosis and menopause  Osteoporosis is a disease in which the bones lose minerals and strength with aging. This can result in serious bone fractures. Your risk for osteoporosis can be identified using a bone density scan.  If you are 68 years of age or older, or if you are at risk for osteoporosis and fractures, ask your health care provider if you should be screened.  Ask your health care provider whether you should take a calcium or vitamin D supplement to lower your risk for osteoporosis.  Menopause may have certain physical symptoms and risks.  Hormone replacement therapy may reduce some of these symptoms and risks. Talk to your health care provider about whether hormone replacement therapy is right for you. Follow these instructions at home:  Schedule regular health, dental, and eye exams.  Stay current with your immunizations.  Do not use any tobacco products including cigarettes, chewing tobacco, or electronic cigarettes.  If you are pregnant, do not drink alcohol.  If you are breastfeeding, limit how much and how often you drink alcohol.  Limit alcohol intake to no more than 1 drink per day for nonpregnant women. One drink equals 12 ounces of beer, 5 ounces of wine, or 1 ounces of hard liquor.  Do not use street drugs.  Do not share needles.  Ask  your health care provider for help if you need support or information about quitting drugs.  Tell your health care provider if you often feel depressed.  Tell your health care provider if you have ever been abused or do not feel safe at home. This information is not intended to replace advice given to you by your health care provider. Make sure you discuss any questions you have with your health care provider. Document Released: 09/08/2010 Document Revised: 08/01/2015 Document Reviewed: 11/27/2014 Elsevier Interactive Patient Education  2017 [employer].

## 2016-06-29 NOTE — Progress Notes (Signed)
Patient is here for Asthma  Patient complains of generalized body aches scaled at a 5.  Patient has taken medication today. Patient has eaten today.  Patient request refills on inhalers.

## 2016-06-29 NOTE — Progress Notes (Signed)
Tina [name], is a 61 y.o. female  DVV:[id number]  GYI:[id number]  DOB - 1955-05-14  Chief Complaint  Patient presents with  . Asthma        Subjective:   Tina [name] is a 61 y.o. female here today for a follow up visit for asthma and htn. Of note, pt's breathing is better now that started on advair, using Albuterol less, but states she is still using the alb 2x day (thought she needed to), on sigulair daily.  Says she is sleeping and breathing better w/ advair. Taking all other meds as prescribed.   Patient has No headache, No chest pain, No abdominal pain - No Nausea, No new weakness tingling or numbness, No Cough - SOB.   Interpreter, Female present during exam,  was available to communicate directly with patient for the entire encounter including providing detailed patient instructions.   No problems updated.  ALLERGIES: Allergies  Allergen Reactions  . Hctz [Hydrochlorothiazide] Shortness Of Breath    PAST MEDICAL HISTORY: Past Medical History:  Diagnosis Date  . Asthma   . Nephrolithiasis    "came out on it's own" (12/21/2012)    MEDICATIONS AT HOME: Prior to Admission medications   Medication Sig Start Date End Date Taking? Authorizing Provider  albuterol (PROVENTIL HFA;VENTOLIN HFA) 108 (90 Base) MCG/ACT inhaler Inhale 2 puffs into the lungs every 6 (six) hours as needed for wheezing or shortness of breath. 05/26/16  Yes Maren Reamer, MD  amoxicillin (AMOXIL) 500 MG capsule Take 2 capsules (1,000 mg total) by mouth 2 (two) times daily. 05/27/16  Yes Maren Reamer, MD  cetirizine (ZYRTEC) 10 MG tablet Take 1 tablet (10 mg total) by mouth daily. 06/29/16  Yes Maren Reamer, MD  clarithromycin (BIAXIN) 500 MG tablet Take 1 tablet (500 mg total) by mouth 2 (two) times daily. 05/27/16  Yes Maren Reamer, MD  fluticasone (FLONASE) 50 MCG/ACT nasal spray Place 2 sprays into both nostrils daily. 01/14/16  Yes Maren Reamer, MD  Fluticasone-Salmeterol (ADVAIR  DISKUS) 250-50 MCG/DOSE AEPB Inhale 1 puff into the lungs 2 (two) times daily. 05/26/16  Yes Maren Reamer, MD  lisinopril (ZESTRIL) 2.5 MG tablet Take 1 tablet (2.5 mg total) by mouth daily. 05/26/16  Yes Maren Reamer, MD  metFORMIN (GLUCOPHAGE XR) 500 MG 24 hr tablet Take 1 tablet (500 mg total) by mouth daily with breakfast. 05/26/16  Yes Maren Reamer, MD  montelukast (SINGULAIR) 10 MG tablet Take 1 tablet (10 mg total) by mouth at bedtime. 06/29/16   Maren Reamer, MD  pantoprazole (PROTONIX) 40 MG tablet Take 1 tablet (40 mg total) by mouth 2 (two) times daily. 05/27/16   Maren Reamer, MD  polyethylene glycol powder (GLYCOLAX/MIRALAX) powder Take 17 g by mouth daily. Patient not taking: Reported on 01/14/2016 07/08/15   Boykin Nearing, MD  pravastatin (PRAVACHOL) 40 MG tablet Take 1 tablet (40 mg total) by mouth daily. 05/27/16   Maren Reamer, MD  Vitamin D, Ergocalciferol, (DRISDOL) 50000 units CAPS capsule Take 1 capsule (50,000 Units total) by mouth every 7 (seven) days. 05/27/16   Maren Reamer, MD     Objective:   Vitals:   06/29/16 0856  BP: 111/64  Pulse: 69  Resp: 18  Temp: 98.1 F (36.7 C)  TempSrc: Oral  SpO2: 99%  Weight: 126 lb 3.2 oz (57.2 kg)  Height: 4\' 10"  (1.473 m)    Exam General appearance : Awake, alert, not in any  distress. Speech Clear. Not toxic looking, pleasant. HEENT: Atraumatic and Normocephalic, pupils equally reactive to light. Neck: supple, no JVD. No cervical lymphadenopathy.  Chest:Good air entry bilaterally, no added sounds. CVS: S1 S2 regular, no murmurs/gallups or rubs. Abdomen: Bowel sounds active, Non tender and not distended with no gaurding, rigidity or rebound. Extremities: B/L Lower Ext shows no edema, both legs are warm to touch Neurology: Awake alert, and oriented X 3, CN II-XII grossly intact, Non focal Skin:No Rash  Data Review Lab Results  Component Value Date   HGBA1C 6.1 05/26/2016   HGBA1C 6.20 04/04/2015      Depression screen [facility] 2/9 06/29/2016 12/09/2015 07/08/2015 04/04/2015 02/22/2014  Decreased Interest 3 3 0 0 -  Down, Depressed, Hopeless 0 0 0 0 1  PHQ - 2 Score 3 3 0 0 1  Altered sleeping 0 3 0 - -  Tired, decreased energy 3 3 0 - -  Change in appetite 2 2 0 - -  Feeling bad or failure about yourself  0 0 0 - -  Trouble concentrating 3 2 0 - -  Moving slowly or fidgety/restless 3 2 0 - -  Suicidal thoughts 0 0 0 - -  PHQ-9 Score 14 15 0 - -      Assessment & Plan   1. Mild intermittent asthma without complication - better controlled now on advair bid, albuterol prn, and singulair - advised pt to only use albuterol prn, if does not need, do not use. - advised pt that as weather/pollen count improves, she can try weaning down on advair as well. However, if starts needing more albuterol, than resume bid advair. - continue ppi qd - continue singulair qd - cetirizine (ZYRTEC) 10 MG tablet; Take 1 tablet (10 mg total) by mouth daily.  Dispense: 30 tablet; Refill: 11  2. Essential hypertension Well controlled, continue low salt diet, continue low dose lisinopril 2.5qd  3. Vitamin D deficiency When finish rx, buy otc vit d 5000iu qd and calcium 1200mg /daily  4. heathmaintenance uptodate.     Patient have been counseled extensively about nutrition and exercise  Return in about 3 months (around 09/28/2016), or if symptoms worsen or fail to improve.  The patient was given clear instructions to go to ER or return to medical center if symptoms don't improve, worsen or new problems develop. The patient verbalized understanding. The patient was told to call to get lab results if they haven't heard anything in the next week.   This note has been created with Surveyor, quantity. Any transcriptional errors are unintentional.   Maren Reamer, MD, [facility] [place], [place]    06/29/2016, 9:09 AM

## 2016-07-23 ENCOUNTER — Encounter: Payer: Self-pay | Admitting: Internal Medicine

## 2016-07-24 ENCOUNTER — Encounter: Payer: Self-pay | Admitting: Internal Medicine

## 2016-07-27 ENCOUNTER — Other Ambulatory Visit: Payer: Self-pay | Admitting: Internal Medicine

## 2016-07-27 ENCOUNTER — Encounter: Payer: Self-pay | Admitting: Internal Medicine

## 2016-07-27 MED FILL — PRAVASTATIN NA 40 MG TAB: 40 | 30 days supply | Qty: 30 | Fill #2

## 2016-07-27 MED FILL — VIT D2 1.25 MG (50,000 UNIT: 1.25 MG | 28 days supply | Qty: 4 | Fill #2

## 2016-07-27 MED FILL — VENTOLIN HFA 90 MCG INHALER: 108 (90 BAS | 25 days supply | Qty: 18 | Fill #2

## 2016-07-27 MED FILL — MONTELUKAST SOD 10 MG TAB: 10 | 30 days supply | Qty: 30 | Fill #0

## 2016-07-27 MED FILL — LISINOPRIL 2.5 MG TABLET: 2.5 | 30 days supply | Qty: 30 | Fill #2

## 2016-07-28 MED FILL — ADVAIR 250/50 DISKUS: 250-50 | 30 days supply | Qty: 60 | Fill #0

## 2016-08-24 MED FILL — PRAVASTATIN NA 40 MG TAB: 40 | 30 days supply | Qty: 30 | Fill #3

## 2016-08-24 MED FILL — MONTELUKAST SOD 10 MG TAB: 10 | 30 days supply | Qty: 30 | Fill #1

## 2016-08-24 MED FILL — METFORMIN HCL ER 500 MG TAB: 500 | 90 days supply | Qty: 90 | Fill #1

## 2016-08-24 MED FILL — LISINOPRIL 2.5 MG TABLET: 2.5 | 30 days supply | Qty: 30 | Fill #3

## 2016-09-28 ENCOUNTER — Encounter: Payer: Self-pay | Admitting: Family Medicine

## 2016-09-28 ENCOUNTER — Ambulatory Visit: Payer: Medicare Other | Attending: Family Medicine | Admitting: Family Medicine

## 2016-09-28 VITALS — BP 121/70 | HR 64 | Temp 97.7°F | Resp 18 | Ht <= 58 in | Wt 123.6 lb

## 2016-09-28 DIAGNOSIS — T781XXA Other adverse food reactions, not elsewhere classified, initial encounter: Secondary | ICD-10-CM | POA: Diagnosis not present

## 2016-09-28 DIAGNOSIS — E559 Vitamin D deficiency, unspecified: Secondary | ICD-10-CM | POA: Insufficient documentation

## 2016-09-28 DIAGNOSIS — J45909 Unspecified asthma, uncomplicated: Secondary | ICD-10-CM | POA: Insufficient documentation

## 2016-09-28 DIAGNOSIS — X58XXXA Exposure to other specified factors, initial encounter: Secondary | ICD-10-CM | POA: Diagnosis not present

## 2016-09-28 DIAGNOSIS — R7303 Prediabetes: Secondary | ICD-10-CM | POA: Diagnosis not present

## 2016-09-28 DIAGNOSIS — H538 Other visual disturbances: Secondary | ICD-10-CM | POA: Diagnosis not present

## 2016-09-28 DIAGNOSIS — Z760 Encounter for issue of repeat prescription: Secondary | ICD-10-CM

## 2016-09-28 DIAGNOSIS — J4520 Mild intermittent asthma, uncomplicated: Secondary | ICD-10-CM | POA: Diagnosis not present

## 2016-09-28 DIAGNOSIS — Z7984 Long term (current) use of oral hypoglycemic drugs: Secondary | ICD-10-CM | POA: Insufficient documentation

## 2016-09-28 DIAGNOSIS — K5900 Constipation, unspecified: Secondary | ICD-10-CM | POA: Insufficient documentation

## 2016-09-28 DIAGNOSIS — Z7951 Long term (current) use of inhaled steroids: Secondary | ICD-10-CM | POA: Diagnosis not present

## 2016-09-28 DIAGNOSIS — I10 Essential (primary) hypertension: Secondary | ICD-10-CM | POA: Diagnosis not present

## 2016-09-28 DIAGNOSIS — Z131 Encounter for screening for diabetes mellitus: Secondary | ICD-10-CM | POA: Diagnosis not present

## 2016-09-28 DIAGNOSIS — K5901 Slow transit constipation: Secondary | ICD-10-CM | POA: Diagnosis not present

## 2016-09-28 DIAGNOSIS — K219 Gastro-esophageal reflux disease without esophagitis: Secondary | ICD-10-CM | POA: Diagnosis not present

## 2016-09-28 LAB — GLUCOSE, POCT (MANUAL RESULT ENTRY): POC Glucose: 98 mg/dl (ref 70–99)

## 2016-09-28 LAB — POCT GLYCOSYLATED HEMOGLOBIN (HGB A1C): Hemoglobin A1C: 6.1

## 2016-09-28 MED ORDER — MONTELUKAST SODIUM 10 MG PO TABS: 10.0000 mg | ORAL_TABLET | Freq: Every day | ORAL | 3 refills | Status: DC

## 2016-09-28 MED ORDER — FLUTICASONE-SALMETEROL 250-50 MCG/DOSE IN AEPB: INHALATION_SPRAY | RESPIRATORY_TRACT | 1 refills | Status: DC

## 2016-09-28 MED ORDER — EPINEPHRINE 0.3 MG/0.3ML IJ SOAJ: 0.3000 mg | Freq: Once | INTRAMUSCULAR | 0 refills | Status: AC

## 2016-09-28 MED ORDER — GLUCOSE BLOOD VI STRP: ORAL_STRIP | 12 refills | Status: DC

## 2016-09-28 MED ORDER — PRAVASTATIN SODIUM 40 MG PO TABS: 40.0000 mg | ORAL_TABLET | Freq: Every day | ORAL | 3 refills | Status: DC

## 2016-09-28 MED ORDER — FLUTICASONE PROPIONATE 50 MCG/ACT NA SUSP: 2.0000 | Freq: Every day | NASAL | 11 refills | Status: DC

## 2016-09-28 MED ORDER — ALBUTEROL SULFATE HFA 108 (90 BASE) MCG/ACT IN AERS: 2.0000 | INHALATION_SPRAY | Freq: Four times a day (QID) | RESPIRATORY_TRACT | 3 refills | Status: DC | PRN

## 2016-09-28 MED ORDER — TRUE METRIX METER W/DEVICE KIT: 1.0000 | PACK | Freq: Once | 0 refills | Status: AC

## 2016-09-28 MED ORDER — LISINOPRIL 2.5 MG PO TABS: 2.5000 mg | ORAL_TABLET | Freq: Every day | ORAL | 3 refills | Status: DC

## 2016-09-28 MED ORDER — PANTOPRAZOLE SODIUM 40 MG PO TBEC: 40.0000 mg | DELAYED_RELEASE_TABLET | Freq: Every day | ORAL | 2 refills | Status: DC

## 2016-09-28 MED ORDER — TRUEPLUS LANCETS 28G MISC: 1.0000 | Freq: Once | 12 refills | Status: AC

## 2016-09-28 MED ORDER — DIPHENHYDRAMINE HCL 25 MG PO CAPS: 25.0000 mg | ORAL_CAPSULE | Freq: Four times a day (QID) | ORAL | 0 refills | Status: DC | PRN

## 2016-09-28 MED ORDER — METFORMIN HCL ER 500 MG PO TB24: 500.0000 mg | ORAL_TABLET | Freq: Two times a day (BID) | ORAL | 2 refills | Status: DC

## 2016-09-28 MED ORDER — POLYETHYLENE GLYCOL 3350 17 GM/SCOOP PO POWD: 17.0000 g | Freq: Every day | ORAL | 2 refills | Status: DC

## 2016-09-28 MED FILL — PRAVASTATIN NA 40 MG TAB: 40 | 90 days supply | Qty: 90 | Fill #0

## 2016-09-28 MED FILL — EPIPEN 2-PAK 0.3 MG AUTO-IN: 0.3 | 30 days supply | Qty: 2 | Fill #0

## 2016-09-28 MED FILL — PANTOPRAZOLE SOD DR 40 MG T: 40 | 30 days supply | Qty: 30 | Fill #0 | Status: TO

## 2016-09-28 MED FILL — VENTOLIN HFA 90 MCG INHALER: 108 (90 BAS | 25 days supply | Qty: 18 | Fill #0 | Status: TO

## 2016-09-28 MED FILL — METFORMIN HCL ER 500 MG TAB: 500 | 30 days supply | Qty: 60 | Fill #0

## 2016-09-28 MED FILL — LISINOPRIL 2.5 MG TABLET: 2.5 | 90 days supply | Qty: 90 | Fill #0

## 2016-09-28 MED FILL — ADVAIR 250/50 DISKUS: 250-50 | 30 days supply | Qty: 60 | Fill #1

## 2016-09-28 MED FILL — FLUTICASONE PROP 50 MCG SPR: 50 | 30 days supply | Qty: 16 | Fill #0

## 2016-09-28 MED FILL — MONTELUKAST SOD 10 MG TAB: 10 | 90 days supply | Qty: 90 | Fill #0

## 2016-09-28 NOTE — Progress Notes (Signed)
Subjective:  Patient ID: Tina [name], female    DOB: 1955-05-10  Age: 61 y.o. MRN: [id number]  CC: Establish Care    HPI Tina [name] presents to establish care. History of HTN, asthma, prediabetes, and GERD. She report symptoms of  visual disturbances. for 1 wk. Patient denies nausea, paresthesia of the feet, polydipsia, polyuria and vomitting.  Evaluation to date has been included: hemoglobin A1C.  Home sugars: patient does not check sugars. Treatment to date: metformin. She reports history of itchy palate and itchy throat and coughing. These symptoms are occur when she eats seafood. She reports experiencing this symptoms for 1 year.  She denies taking anything for symptoms. History of asthma. Medications used in the past to treat these symptoms include beta agonist inhalers, combination beta agonists/steroid inhalers and leukotriene antagonists.  Patient has not required Emergency Room treatment for asthma symptoms recently, and has not required hospitalization. The patient has not been intubated in the past.     Outpatient Medications Prior to Visit  Medication Sig Dispense Refill  . cetirizine (ZYRTEC) 10 MG tablet Take 1 tablet (10 mg total) by mouth daily. 30 tablet 11  . Vitamin D, Ergocalciferol, (DRISDOL) 50000 units CAPS capsule Take 1 capsule (50,000 Units total) by mouth every 7 (seven) days. 12 capsule 0  . ADVAIR DISKUS 250-50 MCG/DOSE AEPB INHALE 1 PUFF INTO THE LUNGS 2 TIMES DAILY. 60 each 1  . albuterol (PROVENTIL HFA;VENTOLIN HFA) 108 (90 Base) MCG/ACT inhaler Inhale 2 puffs into the lungs every 6 (six) hours as needed for wheezing or shortness of breath. 1 Inhaler 11  . amoxicillin (AMOXIL) 500 MG capsule Take 2 capsules (1,000 mg total) by mouth 2 (two) times daily. 56 capsule 0  . clarithromycin (BIAXIN) 500 MG tablet Take 1 tablet (500 mg total) by mouth 2 (two) times daily. 28 tablet 0  . fluticasone (FLONASE) 50 MCG/ACT nasal spray Place 2 sprays into both nostrils daily.  16 g 11  . lisinopril (ZESTRIL) 2.5 MG tablet Take 1 tablet (2.5 mg total) by mouth daily. 90 tablet 3  . metFORMIN (GLUCOPHAGE XR) 500 MG 24 hr tablet Take 1 tablet (500 mg total) by mouth daily with breakfast. 90 tablet 3  . montelukast (SINGULAIR) 10 MG tablet Take 1 tablet (10 mg total) by mouth at bedtime. 90 tablet 3  . pantoprazole (PROTONIX) 40 MG tablet Take 1 tablet (40 mg total) by mouth 2 (two) times daily. 60 tablet 0  . polyethylene glycol powder (GLYCOLAX/MIRALAX) powder Take 17 g by mouth daily. (Patient not taking: Reported on 01/14/2016) 3350 g 2  . pravastatin (PRAVACHOL) 40 MG tablet Take 1 tablet (40 mg total) by mouth daily. 90 tablet 3   No facility-administered medications prior to visit.     ROS Review of Systems  Constitutional: Negative.   HENT:       Itchy throat  Eyes: Positive for visual disturbance (blurred vision).  Respiratory: Positive for cough.   Cardiovascular: Negative.   Gastrointestinal: Negative.   Skin: Negative.    Objective:  BP 121/70 (BP Location: Left Arm, Patient Position: Sitting, Cuff Size: Normal)   Pulse 64   Temp 97.7 F (36.5 C) (Oral)   Resp 18   Ht _0  (1.473 m)   Wt 123 lb 9.6 oz (56.1 kg)   SpO2 94%   BMI 25.83 kg/m   BP/Weight 09/28/2016 06/29/2016 09/12/2374  Systolic BP 283 151 761  Diastolic BP 70 64 88  Wt. (Lbs) 123.6 126.2 127  BMI 25.83 26.38 26.54   Physical Exam  Constitutional: She appears well-developed and well-nourished.  HENT:  Head: Normocephalic and atraumatic.  Right Ear: External ear normal.  Left Ear: External ear normal.  Nose: Nose normal.  Mouth/Throat: Oropharynx is clear and moist.  Eyes: Pupils are equal, round, and reactive to light. Conjunctivae are normal.  Neck: Normal range of motion. Neck supple. No JVD present.  Cardiovascular: Normal rate, normal heart sounds and intact distal pulses.   Pulmonary/Chest: Effort normal and breath sounds normal. She has no wheezes.  Abdominal:  There is tenderness (LLQ).  Lymphadenopathy:    She has no cervical adenopathy.  Skin: Skin is warm and dry.  Psychiatric: She has a normal mood and affect.  Nursing note and vitals reviewed.  Assessment & Plan:   Problem List Items Addressed This Visit      Cardiovascular and Mediastinum   Essential hypertension (Chronic)   Relevant Medications   EPINEPHrine 0.3 mg/0.3 mL IJ SOAJ injection   lisinopril (ZESTRIL) 2.5 MG tablet   pravastatin (PRAVACHOL) 40 MG tablet   Other Relevant Orders   Lipid Panel   Hepatic Function Panel     Respiratory   Asthma (Chronic)   Relevant Medications   Fluticasone-Salmeterol (ADVAIR DISKUS) 250-50 MCG/DOSE AEPB   montelukast (SINGULAIR) 10 MG tablet   albuterol (PROVENTIL HFA;VENTOLIN HFA) 108 (90 Base) MCG/ACT inhaler   fluticasone (FLONASE) 50 MCG/ACT nasal spray     Digestive   Constipation (Chronic)   Relevant Medications   polyethylene glycol powder (GLYCOLAX/MIRALAX) powder     Other   Diabetes mellitus screening   Relevant Orders   Glucose (CBG) (Completed)   Prediabetes   Relevant Medications   metFORMIN (GLUCOPHAGE XR) 500 MG 24 hr tablet   Blood Glucose Monitoring Suppl (TRUE METRIX METER) w/Device KIT   glucose blood (TRUE METRIX BLOOD GLUCOSE TEST) test strip   TRUEPLUS LANCETS 28G MISC   Other Relevant Orders   HgB A1c (Completed)    Other Visit Diagnoses    Allergic reaction to food, initial encounter    -  Primary   Relevant Medications   diphenhydrAMINE (BENADRYL) 25 mg capsule   EPINEPHrine 0.3 mg/0.3 mL IJ SOAJ injection   Other Relevant Orders   Ambulatory referral to Allergy   Blurred vision, bilateral       Visual acuity screen   Relevant Orders   Ambulatory referral to Ophthalmology   Medication refill       Relevant Medications   pantoprazole (PROTONIX) 40 MG tablet   Vitamin D deficiency       Relevant Orders   Vitamin D, 25-hydroxy      Meds ordered this encounter  Medications  .  diphenhydrAMINE (BENADRYL) 25 mg capsule    Sig: Take 1 capsule (25 mg total) by mouth every 6 (six) hours as needed for itching or allergies.    Dispense:  30 capsule    Refill:  0    Order Specific Question:   Supervising Provider    Answer:   Tresa Garter [id number]  . EPINEPHrine 0.3 mg/0.3 mL IJ SOAJ injection    Sig: Inject 0.3 mLs (0.3 mg total) into the muscle once.    Dispense:  1 Device    Refill:  0    Order Specific Question:   Supervising Provider    Answer:   Tresa Garter [id number]  . metFORMIN (GLUCOPHAGE XR) 500 MG 24 hr tablet    Sig: Take 1 tablet (500  mg total) by mouth 2 (two) times daily with a meal.    Dispense:  60 tablet    Refill:  2    Order Specific Question:   Supervising Provider    Answer:   Tresa Garter [id number]  . Fluticasone-Salmeterol (ADVAIR DISKUS) 250-50 MCG/DOSE AEPB    Sig: INHALE 1 PUFF INTO THE LUNGS 2 TIMES DAILY.    Dispense:  60 each    Refill:  1    Order Specific Question:   Supervising Provider    Answer:   Tresa Garter [id number]  . pantoprazole (PROTONIX) 40 MG tablet    Sig: Take 1 tablet (40 mg total) by mouth daily.    Dispense:  30 tablet    Refill:  2    Order Specific Question:   Supervising Provider    Answer:   Tresa Garter [id number]  . lisinopril (ZESTRIL) 2.5 MG tablet    Sig: Take 1 tablet (2.5 mg total) by mouth daily.    Dispense:  90 tablet    Refill:  3    Order Specific Question:   Supervising Provider    Answer:   Tresa Garter [id number]  . pravastatin (PRAVACHOL) 40 MG tablet    Sig: Take 1 tablet (40 mg total) by mouth daily.    Dispense:  90 tablet    Refill:  3    Order Specific Question:   Supervising Provider    Answer:   Tresa Garter [id number]  . montelukast (SINGULAIR) 10 MG tablet    Sig: Take 1 tablet (10 mg total) by mouth at bedtime.    Dispense:  90 tablet    Refill:  3    Order Specific Question:   Supervising Provider    Answer:    Tresa Garter [id number]  . albuterol (PROVENTIL HFA;VENTOLIN HFA) 108 (90 Base) MCG/ACT inhaler    Sig: Inhale 2 puffs into the lungs every 6 (six) hours as needed for wheezing or shortness of breath.    Dispense:  1 Inhaler    Refill:  3    Order Specific Question:   Supervising Provider    Answer:   Tresa Garter [id number]  . fluticasone (FLONASE) 50 MCG/ACT nasal spray    Sig: Place 2 sprays into both nostrils daily.    Dispense:  16 g    Refill:  11    Order Specific Question:   Supervising Provider    Answer:   Tresa Garter [id number]  . polyethylene glycol powder (GLYCOLAX/MIRALAX) powder    Sig: Take 17 g by mouth daily.    Dispense:  3350 g    Refill:  2    Order Specific Question:   Supervising Provider    Answer:   Tresa Garter [id number]  . Blood Glucose Monitoring Suppl (TRUE METRIX METER) w/Device KIT    Sig: 1 Device by Does not apply route once.    Dispense:  1 kit    Refill:  0    Order Specific Question:   Supervising Provider    Answer:   Tresa Garter [id number]  . glucose blood (TRUE METRIX BLOOD GLUCOSE TEST) test strip    Sig: Use as instructed    Dispense:  100 each    Refill:  12    Order Specific Question:   Supervising Provider    Answer:   Tresa Garter [[id number]]  . TRUEPLUS LANCETS 28G MISC    Sig: 1 kit by  Does not apply route once.    Dispense:  100 each    Refill:  12    Order Specific Question:   Supervising Provider    Answer:   Tresa Garter [[id number]]    Follow-up: Return in about 3 months (around 10/19/2016) for HTN/prediabetes.   Alfonse Spruce FNP

## 2016-09-28 NOTE — Patient Instructions (Addendum)
Avoid seafood until evaluation by allergist. Check blood sugars once daily and keep log to bring to next office visit.

## 2016-09-28 NOTE — Progress Notes (Signed)
Patient is here for itchy throat and coughing

## 2016-09-29 LAB — HEPATIC FUNCTION PANEL
ALBUMIN: 4.7 g/dL (ref 3.6–4.8)
ALK PHOS: 88 IU/L (ref 39–117)
ALT: 29 IU/L (ref 0–32)
AST: 24 IU/L (ref 0–40)
Bilirubin Total: 0.5 mg/dL (ref 0.0–1.2)
Bilirubin, Direct: 0.13 mg/dL (ref 0.00–0.40)
TOTAL PROTEIN: 7.4 g/dL (ref 6.0–8.5)

## 2016-09-29 LAB — LIPID PANEL
Chol/HDL Ratio: 4 ratio (ref 0.0–4.4)
Cholesterol, Total: 152 mg/dL (ref 100–199)
HDL: 38 mg/dL — AB (ref >39)
LDL CALC: 72 mg/dL (ref 0–99)
Triglycerides: 210 mg/dL — ABNORMAL HIGH (ref 0–149)
VLDL CHOLESTEROL CAL: 42 mg/dL — AB (ref 5–40)

## 2016-09-29 LAB — VITAMIN D 25 HYDROXY (VIT D DEFICIENCY, FRACTURES): VIT D 25 HYDROXY: 29.4 ng/mL — AB (ref 30.0–100.0)

## 2016-10-01 ENCOUNTER — Other Ambulatory Visit: Payer: Self-pay | Admitting: Family Medicine

## 2016-10-01 DIAGNOSIS — E782 Mixed hyperlipidemia: Secondary | ICD-10-CM

## 2016-10-01 MED ORDER — PRAVASTATIN SODIUM 80 MG PO TABS: 80.0000 mg | ORAL_TABLET | Freq: Every day | ORAL | 3 refills | Status: DC

## 2016-10-05 ENCOUNTER — Other Ambulatory Visit: Payer: Self-pay | Admitting: Family Medicine

## 2016-10-05 DIAGNOSIS — E782 Mixed hyperlipidemia: Secondary | ICD-10-CM

## 2016-10-05 DIAGNOSIS — E559 Vitamin D deficiency, unspecified: Secondary | ICD-10-CM

## 2016-10-05 MED ORDER — ASPIRIN EC 81 MG PO TBEC: 81.0000 mg | DELAYED_RELEASE_TABLET | Freq: Every day | ORAL | 3 refills | Status: DC

## 2016-10-05 MED ORDER — VITAMIN D (ERGOCALCIFEROL) 1.25 MG (50000 UNIT) PO CAPS: ORAL_CAPSULE | ORAL | 0 refills | Status: DC

## 2016-10-05 MED FILL — VIT D2 1.25 MG (50,000 UNIT: 1.25 MG | 84 days supply | Qty: 12 | Fill #0

## 2016-10-09 ENCOUNTER — Encounter (HOSPITAL_COMMUNITY): Payer: Self-pay | Admitting: *Deleted

## 2016-10-09 ENCOUNTER — Emergency Department (HOSPITAL_COMMUNITY)
Admission: EM | Admit: 2016-10-09 | Discharge: 2016-10-09 | Disposition: A | Discharge status: Home / Self Care | Payer: Medicare Other | Attending: Emergency Medicine | Admitting: Emergency Medicine

## 2016-10-09 ENCOUNTER — Emergency Department (HOSPITAL_COMMUNITY): Payer: Medicare Other

## 2016-10-09 DIAGNOSIS — Z7982 Long term (current) use of aspirin: Secondary | ICD-10-CM | POA: Insufficient documentation

## 2016-10-09 DIAGNOSIS — M5441 Lumbago with sciatica, right side: Secondary | ICD-10-CM | POA: Insufficient documentation

## 2016-10-09 DIAGNOSIS — Z79899 Other long term (current) drug therapy: Secondary | ICD-10-CM | POA: Diagnosis not present

## 2016-10-09 DIAGNOSIS — J45909 Unspecified asthma, uncomplicated: Secondary | ICD-10-CM | POA: Insufficient documentation

## 2016-10-09 DIAGNOSIS — M79604 Pain in right leg: Secondary | ICD-10-CM | POA: Diagnosis not present

## 2016-10-09 DIAGNOSIS — R030 Elevated blood-pressure reading, without diagnosis of hypertension: Secondary | ICD-10-CM | POA: Diagnosis not present

## 2016-10-09 DIAGNOSIS — M545 Low back pain: Secondary | ICD-10-CM | POA: Diagnosis not present

## 2016-10-09 DIAGNOSIS — I10 Essential (primary) hypertension: Secondary | ICD-10-CM | POA: Diagnosis not present

## 2016-10-09 MED ORDER — CYCLOBENZAPRINE HCL 10 MG PO TABS: 10.0000 mg | ORAL_TABLET | Freq: Every evening | ORAL | 0 refills | Status: DC | PRN

## 2016-10-09 MED ORDER — HYDROCODONE-ACETAMINOPHEN 5-325 MG PO TABS: 1.0000 | ORAL_TABLET | Freq: Four times a day (QID) | ORAL | 0 refills | Status: DC | PRN

## 2016-10-09 NOTE — ED Provider Notes (Signed)
[facility] DEPT Provider Note   CSN: [id number] Arrival date & time: 10/09/16  2018     History   Chief Complaint Chief Complaint  Patient presents with  . Leg Injury    HPI Tina [name] is a 61 y.o. female with PMHx of HTN, GERD, asthma, presenting with acute onset of worsening right leg pain that began on Wednesday. Pt states pain begins at right side of lower back and travels town lateral thigh to anterior knee and into foot. She states she has been rubbing her leg to alleviate pain, however it is not working. She has taken tylenol with minimal relief. Also reports tingling sensation down leg. Denies bowel or bladder incontinence. No known injury. No fevers.   The history is provided by the patient and the spouse. The history is limited by a language barrier. A language interpreter was used Public house manager stating pt speaks very rare type of vietnamese. Pt and pt's husband unable to understand interpreter. Pt's husband speaks some english and was able to answer questions appropriately in [place].).    Past Medical History:  Diagnosis Date  . Asthma   . Nephrolithiasis    "came out on it's own" (12/21/2012)    Patient Active Problem List   Diagnosis Date Noted  . Prediabetes 09/28/2016  . Diabetes mellitus screening 05/26/2016  . Essential hypertension 12/09/2015  . Gastroesophageal reflux disease 12/09/2015  . Constipation 07/08/2015  . Asthma 02/22/2014  . Chronic headache 02/22/2014    Past Surgical History:  Procedure Laterality Date  . CHOLECYSTECTOMY N/A 12/21/2012   Procedure: LAPAROSCOPIC CHOLECYSTECTOMY;  Surgeon: Ralene Ok, MD;  Location: [facility];  Service: General;  Laterality: N/A;  . KIDNEY STONE SURGERY     spouse denies this hx on 12/21/2012  . LAPAROSCOPIC CHOLECYSTECTOMY  12/21/2012    OB History    No data available       Home Medications    Prior to Admission medications   Medication Sig Start Date End Date Taking? Authorizing Provider    albuterol (PROVENTIL HFA;VENTOLIN HFA) 108 (90 Base) MCG/ACT inhaler Inhale 2 puffs into the lungs every 6 (six) hours as needed for wheezing or shortness of breath. 09/28/16   Alfonse Spruce, FNP  aspirin EC 81 MG tablet Take 1 tablet (81 mg total) by mouth daily. 10/05/16   Alfonse Spruce, FNP  cetirizine (ZYRTEC) 10 MG tablet Take 1 tablet (10 mg total) by mouth daily. 06/29/16   Maren Reamer, MD  cyclobenzaprine (FLEXERIL) 10 MG tablet Take 1 tablet (10 mg total) by mouth at bedtime as needed for muscle spasms. 10/09/16   Russo, [place] N, PA-C  diphenhydrAMINE (BENADRYL) 25 mg capsule Take 1 capsule (25 mg total) by mouth every 6 (six) hours as needed for itching or allergies. 09/28/16   Alfonse Spruce, FNP  fluticasone (FLONASE) 50 MCG/ACT nasal spray Place 2 sprays into both nostrils daily. 09/28/16   Alfonse Spruce, FNP  Fluticasone-Salmeterol (ADVAIR DISKUS) 250-50 MCG/DOSE AEPB INHALE 1 PUFF INTO THE LUNGS 2 TIMES DAILY. 09/28/16   Alfonse Spruce, FNP  glucose blood (TRUE METRIX BLOOD GLUCOSE TEST) test strip Use as instructed 09/28/16   Alfonse Spruce, FNP  HYDROcodone-acetaminophen (NORCO/VICODIN) 5-325 MG tablet Take 1-2 tablets by mouth every 6 (six) hours as needed for severe pain. 10/09/16   Russo, [place] N, PA-C  lisinopril (ZESTRIL) 2.5 MG tablet Take 1 tablet (2.5 mg total) by mouth daily. 09/28/16   Alfonse Spruce, FNP  metFORMIN (GLUCOPHAGE  XR) 500 MG 24 hr tablet Take 1 tablet (500 mg total) by mouth 2 (two) times daily with a meal. 09/28/16   Hairston, Mandesia R, FNP  montelukast (SINGULAIR) 10 MG tablet Take 1 tablet (10 mg total) by mouth at bedtime. 09/28/16   Alfonse Spruce, FNP  pantoprazole (PROTONIX) 40 MG tablet Take 1 tablet (40 mg total) by mouth daily. 09/28/16   Alfonse Spruce, FNP  polyethylene glycol powder (GLYCOLAX/MIRALAX) powder Take 17 g by mouth daily. 09/28/16   Alfonse Spruce, FNP  pravastatin (PRAVACHOL) 80  MG tablet Take 1 tablet (80 mg total) by mouth daily. 10/01/16   Alfonse Spruce, FNP  Vitamin D, Ergocalciferol, (DRISDOL) 50000 units CAPS capsule TAKE ONE TABLET BY MOUTH ONCE A WEEK FOR 12 WEEKS. 10/05/16   Alfonse Spruce, FNP    Family History Family History  Problem Relation Age of Onset  . Tuberculosis Mother   . Asthma Mother   . Asthma Father   . Tuberculosis Father     Social History Social History  Substance Use Topics  . Smoking status: Never Smoker  . Smokeless tobacco: Never Used  . Alcohol use No     Allergies   Hctz [hydrochlorothiazide]   Review of Systems Review of Systems  Constitutional: Negative for fever.  Gastrointestinal: Negative for abdominal pain.       No bowel incontinence  Genitourinary: Negative for difficulty urinating.  Musculoskeletal: Positive for back pain and myalgias (right leg).  Neurological: Negative for numbness.       Tingling  All other systems reviewed and are negative.    Physical Exam Updated Vital Signs BP 135/78   Pulse 71   Temp 98 F (36.7 C) (Oral)   Resp 17   Ht 4\' 10"  (1.473 m)   Wt 55.8 kg (123 lb)   SpO2 98%   BMI 25.71 kg/m   Physical Exam  Constitutional: She appears well-developed and well-nourished. No distress.  HENT:  Head: Normocephalic and atraumatic.  Eyes: Conjunctivae are normal.  Neck: Normal range of motion. Neck supple.  Cardiovascular: Normal rate and intact distal pulses.   Pulmonary/Chest: Effort normal.  Musculoskeletal:  Right hip, knee, ankle with normal ROM. No deformities, no TTP. L-spine TTP and right sided low back TTP. TTP to right gluteus. Right leg without tenderness  Neurological:  Normal sensation to right leg. 5/5 strength BUE and BLE. Normal gait.  Psychiatric: She has a normal mood and affect. Her behavior is normal.  Nursing note and vitals reviewed.    ED Treatments / Results  Labs (all labs ordered are listed, but only abnormal results are  displayed) Labs Reviewed - No data to display  EKG  EKG Interpretation None       Radiology Dg Lumbar Spine Complete  Result Date: 10/09/2016 CLINICAL DATA:  61 year old female with back pain.  Recent fall. EXAM: LUMBAR SPINE - COMPLETE 4+ VIEW COMPARISON:  CT dated 12/21/2012 FINDINGS: There is no acute fracture or subluxation of the lumbar spine. There is mild chronic compression of the lower lumbar spine. The visualized posterior elements appear intact. The soft tissues appear unremarkable. IMPRESSION: No acute/traumatic lumbar spine pathology. Electronically Signed   By: Anner Crete M.D.   On: 10/09/2016 22:10    Procedures Procedures (including critical care time)  Medications Ordered in ED Medications - No data to display   Initial Impression / Assessment and Plan / ED Course  I have reviewed the triage vital signs and  the nursing notes.  Pertinent labs & imaging results that were available during my care of the patient were reviewed by me and considered in my medical decision making (see chart for details).     Pt presenting with right leg pain as well as low back pain. Symptoms and exam consistent with sciatica. X-ray of L-spine negative for acute pathology. Normal neurological exam, no evidence of urinary incontinence or retention, pain is consistently reproducible. There is no evidence of AAA or concern for dissection at this time.   Patient can walk but states is painful.  No loss of bowel or bladder control.  No concern for cauda equina.  No fever, night sweats, weight loss, h/o cancer, IVDU.  Pain treated here in the department with adequate improvement. RICE protocol and pain medicine indicated and discussed with patient. I have also discussed reasons to return immediately to the ER.  Patient expresses understanding and agrees with plan.  Patient discussed with and seen by Dr. Roderic Palau.  Discussed results, findings, treatment and follow up. Patient advised of return  precautions. Patient verbalized understanding and agreed with plan.   Final Clinical Impressions(s) / ED Diagnoses   Final diagnoses:  Acute right-sided low back pain with right-sided sciatica    New Prescriptions Discharge Medication List as of 10/09/2016 10:54 PM    START taking these medications   Details  cyclobenzaprine (FLEXERIL) 10 MG tablet Take 1 tablet (10 mg total) by mouth at bedtime as needed for muscle spasms., Starting Fri 10/09/2016, Print    HYDROcodone-acetaminophen (NORCO/VICODIN) 5-325 MG tablet Take 1-2 tablets by mouth every 6 (six) hours as needed for severe pain., Starting Fri 10/09/2016, Print         Russo, [place] N, PA-C 10/09/16 2342    Milton Ferguson, MD 10/10/16 [phone]

## 2016-10-09 NOTE — ED Triage Notes (Signed)
The pt is c/o rt thigh pain for 3-4 days  No known injury  They arrived by w/c from ambulance

## 2016-10-09 NOTE — Discharge Instructions (Signed)
Please read instructions below. You can apply ice or heat to your back. You can take Norco every 6 hours as needed for severe pain. You can take flexeril at bedtime. Schedule an appointment with your primary care doctor to follow up on your visit today. Return to the ER for problems urinating or holding your bowels, numbness in your legs, or new or concerning symptoms.

## 2016-10-09 NOTE — ED Notes (Signed)
Pt verbalized understanding discharge instructions and denies any further needs or questions at this time. VS stable, ambulatory and steady gait.   

## 2016-10-19 ENCOUNTER — Ambulatory Visit: Payer: Medicare Other | Attending: Family Medicine | Admitting: Family Medicine

## 2016-10-19 ENCOUNTER — Encounter: Payer: Self-pay | Admitting: Family Medicine

## 2016-10-19 ENCOUNTER — Telehealth: Payer: Self-pay

## 2016-10-19 VITALS — BP 115/70 | HR 69 | Temp 98.5°F | Resp 18 | Ht <= 58 in | Wt 121.0 lb

## 2016-10-19 DIAGNOSIS — I10 Essential (primary) hypertension: Secondary | ICD-10-CM | POA: Insufficient documentation

## 2016-10-19 DIAGNOSIS — Z7982 Long term (current) use of aspirin: Secondary | ICD-10-CM | POA: Diagnosis not present

## 2016-10-19 DIAGNOSIS — R7303 Prediabetes: Secondary | ICD-10-CM

## 2016-10-19 DIAGNOSIS — Z889 Allergy status to unspecified drugs, medicaments and biological substances status: Secondary | ICD-10-CM

## 2016-10-19 DIAGNOSIS — Z8679 Personal history of other diseases of the circulatory system: Secondary | ICD-10-CM | POA: Diagnosis not present

## 2016-10-19 DIAGNOSIS — Z7984 Long term (current) use of oral hypoglycemic drugs: Secondary | ICD-10-CM | POA: Diagnosis not present

## 2016-10-19 DIAGNOSIS — M5432 Sciatica, left side: Secondary | ICD-10-CM | POA: Diagnosis not present

## 2016-10-19 DIAGNOSIS — M5442 Lumbago with sciatica, left side: Secondary | ICD-10-CM | POA: Diagnosis not present

## 2016-10-19 LAB — GLUCOSE, POCT (MANUAL RESULT ENTRY): POC Glucose: 109 mg/dl — AB (ref 70–99)

## 2016-10-19 MED ORDER — NAPROXEN 500 MG PO TABS: 500.0000 mg | ORAL_TABLET | Freq: Two times a day (BID) | ORAL | 0 refills | Status: DC

## 2016-10-19 MED ORDER — CYCLOBENZAPRINE HCL 10 MG PO TABS: 10.0000 mg | ORAL_TABLET | Freq: Every evening | ORAL | 0 refills | Status: DC | PRN

## 2016-10-19 NOTE — Progress Notes (Signed)
Patient is here for f/up  

## 2016-10-19 NOTE — Progress Notes (Signed)
Subjective:  Patient ID: Tina [name], female    DOB: 1955-05-13  Age: 61 y.o. MRN: [id number]  CC: Follow-up   HPI Tina [name] presents for history of HTN and prediabetes. She is accompanied by her husband and [place] interpreter. Patient denies nausea, paresthesia of the feet, polydipsia, polyuria and vomiting.  Evaluation to date has been included: hemoglobin A1C.  Home sugars: patient does not check sugars. Treatment to date: metformin. History or suspected reaction to seafood. She has epi-pen available. She reports avoiding seafood. Denies following up with allergist referral. Follow-up for elevated blood pressure. She is not exercising and is adherent to low salt diet.  Blood pressure is well controlled at home. She reports only taking lisinopril once but discontinued after symptoms of dizziness and SOB. Takes BP at local pharmacy, SBP ranges low 100's , DBP ranges 60's-70's. Cardiac symptoms none. Patient denies chest pain, chest pressure/discomfort, claudication, dyspnea, lower extremity edema, orthopnea, palpitations and syncope.  Cardiovascular risk factors: dyslipidemia, hypertension and sedentary lifestyle. Use of agents associated with hypertension: none. History of target organ damage: none.   Outpatient Medications Prior to Visit  Medication Sig Dispense Refill  . albuterol (PROVENTIL HFA;VENTOLIN HFA) 108 (90 Base) MCG/ACT inhaler Inhale 2 puffs into the lungs every 6 (six) hours as needed for wheezing or shortness of breath. 1 Inhaler 3  . aspirin EC 81 MG tablet Take 1 tablet (81 mg total) by mouth daily. 90 tablet 3  . cetirizine (ZYRTEC) 10 MG tablet Take 1 tablet (10 mg total) by mouth daily. 30 tablet 11  . diphenhydrAMINE (BENADRYL) 25 mg capsule Take 1 capsule (25 mg total) by mouth every 6 (six) hours as needed for itching or allergies. 30 capsule 0  . fluticasone (FLONASE) 50 MCG/ACT nasal spray Place 2 sprays into both nostrils daily. 16 g 11  . Fluticasone-Salmeterol  (ADVAIR DISKUS) 250-50 MCG/DOSE AEPB INHALE 1 PUFF INTO THE LUNGS 2 TIMES DAILY. 60 each 1  . glucose blood (TRUE METRIX BLOOD GLUCOSE TEST) test strip Use as instructed 100 each 12  . metFORMIN (GLUCOPHAGE XR) 500 MG 24 hr tablet Take 1 tablet (500 mg total) by mouth 2 (two) times daily with a meal. 60 tablet 2  . montelukast (SINGULAIR) 10 MG tablet Take 1 tablet (10 mg total) by mouth at bedtime. 90 tablet 3  . pantoprazole (PROTONIX) 40 MG tablet Take 1 tablet (40 mg total) by mouth daily. 30 tablet 2  . polyethylene glycol powder (GLYCOLAX/MIRALAX) powder Take 17 g by mouth daily. 3350 g 2  . pravastatin (PRAVACHOL) 80 MG tablet Take 1 tablet (80 mg total) by mouth daily. 90 tablet 3  . Vitamin D, Ergocalciferol, (DRISDOL) 50000 units CAPS capsule TAKE ONE TABLET BY MOUTH ONCE A WEEK FOR 12 WEEKS. 12 capsule 0  . cyclobenzaprine (FLEXERIL) 10 MG tablet Take 1 tablet (10 mg total) by mouth at bedtime as needed for muscle spasms. 7 tablet 0  . HYDROcodone-acetaminophen (NORCO/VICODIN) 5-325 MG tablet Take 1-2 tablets by mouth every 6 (six) hours as needed for severe pain. 10 tablet 0  . lisinopril (ZESTRIL) 2.5 MG tablet Take 1 tablet (2.5 mg total) by mouth daily. 90 tablet 3   No facility-administered medications prior to visit.     ROS Review of Systems  Constitutional: Negative.   Eyes: Negative.   Respiratory: Negative.   Cardiovascular: Negative.   Gastrointestinal: Negative.   Musculoskeletal: Positive for back pain.  Skin: Negative.   Neurological:  Parathesias    Objective:  BP 115/70 (BP Location: Left Arm, Patient Position: Sitting, Cuff Size: Normal)   Pulse 69   Temp 98.5 F (36.9 C) (Oral)   Resp 18   Ht 4\' 10"  (1.473 m)   Wt 121 lb (54.9 kg)   SpO2 97%   BMI 25.29 kg/m   BP/Weight 10/19/2016 10/09/2016 10/16/1749  Systolic BP 025 852 778  Diastolic BP 70 78 70  Wt. (Lbs) 121 123 123.6  BMI 25.29 25.71 25.83     Physical Exam  Constitutional: She is  oriented to person, place, and time. She appears well-developed and well-nourished.  HENT:  Head: Normocephalic and atraumatic.  Right Ear: External ear normal.  Left Ear: External ear normal.  Nose: Nose normal.  Mouth/Throat: Oropharynx is clear and moist.  Eyes: Pupils are equal, round, and reactive to light. Conjunctivae are normal.  Neck: Normal range of motion. Neck supple. No JVD present.  Cardiovascular: Normal rate, regular rhythm, normal heart sounds and intact distal pulses.   Pulmonary/Chest: Effort normal and breath sounds normal.  Abdominal: Soft. Bowel sounds are normal. There is no tenderness.  Musculoskeletal: Normal range of motion.       Lumbar back: She exhibits pain (positive straight leg test to RLE.).  Neurological: She is alert and oriented to person, place, and time.  Skin: Skin is warm and dry.  Nursing note and vitals reviewed.  Assessment & Plan:   Problem List Items Addressed This Visit      Other   Prediabetes - Primary   Relevant Orders   Glucose (CBG) (Completed)    Other Visit Diagnoses    Acute left-sided back pain with sciatica       Relevant Medications   cyclobenzaprine (FLEXERIL) 10 MG tablet   naproxen (NAPROSYN) 500 MG tablet   History of allergic reaction       Relevant Orders   Ambulatory referral to Allergy   History of hypertension           BP well controlled without use of anti-hypertensive at this time.        Continue dietary interventions and increase physical activity.    Meds ordered this encounter  Medications  . cyclobenzaprine (FLEXERIL) 10 MG tablet    Sig: Take 1 tablet (10 mg total) by mouth at bedtime as needed for muscle spasms.    Dispense:  30 tablet    Refill:  0    Script in [place] if available please.    Order Specific Question:   Supervising Provider    Answer:   Tresa Garter [id number]  . naproxen (NAPROSYN) 500 MG tablet    Sig: Take 1 tablet (500 mg total) by mouth 2 (two) times daily  with a meal. For 10 days. Then take one tablet by mouth daily with meal as needed.    Dispense:  60 tablet    Refill:  0    Script in [place] if available please.    Order Specific Question:   Supervising Provider    Answer:   Tresa Garter [[id number]]    Follow-up: Return in about 3 years (around 10/20/2019) for HLD/HTN/prediabetes.   Alfonse Spruce FNP

## 2016-10-19 NOTE — Patient Instructions (Signed)
Epinephrine injection (Auto-injector) ?y l thu?c g? EPINEPHRINE ???c dng ?? ?i?u tr? c?p c?u cc ph?n ?ng d? ?ng n?ng. Qu v? nn lun gi? thu?c ny bn mnh. Thu?c ny c th? ???c dng cho nh?ng m?c ?ch khc; hy h?i ng??i cung c?p d?ch v? y t? ho?c d??c s? c?a mnh, n?u qu v? c th?c m?c. (CC) NHN HI?U PH? BI?N: Adrenaclick, Auvi-Q, epinephrinesnap, epinephrinesnap-v, EpiPen, EPIsnap Epinephrine, SYMJEPI, Twinject Ti c?n ph?i bo cho ng??i cung c?p d?ch v? y t? c?a mnh ?i?u g tr??c khi dng thu?c ny? H? c?n bi?t li?u qu v? c b?t k? tnh tr?ng no sau ?y khng: -b?nh ti?u ???ng -b?nh tim -huy?t a?p cao -b?nh ph?i ho??c h h?p, ch??ng ha?n nh? hen suy?n -b?nh Parkinson -b?nh tuy?n gip tr?ng -ph?n ?ng b?t th??ng ho?c d? ?ng v?i epinephrine ho?c cc ch?t sulfites -pha?n ??ng b?t th???ng ho??c di? ??ng v??i ca?c d??c ph?m kha?c, th?c ph?m, thu?c nhu?m, ho??c ch?t ba?o qua?n -?ang c thai ho??c ??nh co? thai -?ang cho con bu? Ti nn s? d?ng thu?c ny nh? th? no? Thu?c ny ?? tim vo pha ngoi b?p ?i. Bc s? ho?c chuyn vin y t? s? h??ng d?n qu v? cch dng thi?t b? ny ?ng cch trong tr??ng h?p c?p c?u. Hy ??c k? cc ch? d?n ???c ?nh km theo h?p thu?c. Hy ch?c ch?n r?ng qu v? hi?u h?. Khng ???c dng nhi?u l?n h?n ? ???c ch? d?n. Hy bn v?i bc s? nhi khoa c?a qu v? v? vi?c dng thu?c ny ? tr? em. C th? c?n ch?m [place] ??c bi?t. Thu?c ny th??ng ???c dng cho tr? em. C m?t thi?t b? ??c bi?t ?? dng cho tr? em. N?u qu v? s?p chch thu?c ny cho tr? nh?, th hy gi? ch?t chn tr? vo v? tr tr??c v trong khi tim ?? trnh th??ng tch. Qu li?u: N?u qu v? cho r?ng mnh ? dng qu nhi?u thu?c ny, th hy lin l?c v?i trung tm ki?m sot ch?t ??c ho?c phng c?p c?u ngay l?p t?c. L?U : Thu?c ny ch? dnh ring cho qu v?. Khng chia s? thu?c ny v?i nh?ng ng??i khc. N?u ti l? qun m?t li?u th sao? ?i?u ny khng p d?ng. Qu v? ch? nn dng thu?c ny ??  ?i?u tr? ph?n ?ng d? ?ng. Nh?ng g c th? t??ng tc v?i thu?c ny? Thu?c ny ch? dng trong khi c?p c?u. C th? khng c cc t??ng tc thu?c ?ng k? khi s? d?ng trong c?p c?u. Danh sch ny c th? khng m t? ?? h?t cc t??ng tc c th? x?y ra. Hy ??a cho ng??i cung c?p d?ch v? y t? c?a mnh danh sch t?t c? cc thu?c, th?o d??c, cc thu?c khng c?n toa, ho?c cc ch? ph?m b? sung m qu v? dng. C?ng nn bo cho h? bi?t r?ng qu v? c ht thu?c, u?ng r??u, ho?c c s? d?ng ma ty tri php hay khng. Vi th? c th? t??ng tc v?i thu?c c?a qu v?. Ti c?n ph?i theo di ?i?u g trong khi dng thu?c ny? Hy gi? s?n thu?c ny ?? dng trong tr??ng h?p ph?n ?ng d? ?ng n?ng. Hy ch?c ch?n r?ng qu v? c s?n s? ?i?n tho?i c?a bc s? ho?c chuyn vin y t? c?a mnh v c?a b?nh vi?n ? ??a ph??ng. Hy nh? th??ng xuyn ki?m tra ngy h?t h?n c?a thu?c ny. Qu v? c th? c?n c m?t s? ??n v? thu?c  d? phng ?? ? n?i lm vi?c, tr??ng h?c ho?c cc n?i khc. Hy bn v?i bc s? ho?c chuyn vin y t? v? s? c?n thi?t ph?i c s? thu?c d? phng. Trong s? tr??ng h?p kh?n c?p, c th? ph?i dng thm m?t li?u thu?c. Hy lin l?c v?i bc s? ho?c chuyn vin y t? tr??c khi dng thm m?t li?u thu?c. Sau khi dng thu?c, hy ??n b?nh vi?n g?n nh?t ho?c g?i 911. Hy trnh ho?t ??ng th? ch?t. Hy ch?c ch?n r?ng chuyn vin y t? ?i?u tr? cho qu v? bi?t r?ng qu v? ? ???c tim m?t m?i thu?c ny. Qu v? s? ???c h??ng d?n thm v? vi?c c?n ph?i lm nh?ng g trong v sau khi dng thu?c ny tr??c khi c c?p c?u y t?. Ti c th? nh?n th?y nh?ng tc d?ng ph? no khi dng thu?c ny? Nh?ng tc d?ng ph? qu v? c?n ph?i bo cho bc s? ho?c chuyn vin y t? cng s?m cng t?t: -cc ph?n ?ng d? ?ng, ch?ng h?n nh? da b? m?n ??, ng?a, n?i my ?ay, s?ng ? m?t, mi, ho?c l??i -kh th? -?au ng?c -tim ??p nhanh ho?c khng ??u -?au, t ho?c c?m gic nh? b? ki?n b ? bn tay ho?c bn chn -?au, ??, ng?a, ho?c kch ?ng ? ch? tim -i m?a Cc tc d?ng ph?  khng c?n ph?i ch?m [place] y t? (hy bo cho bc s? ho?c chuyn vin y t?, n?u cc tc d?ng ph? ny ti?p di?n ho?c gy phi?n toi): -c?m th?y lo l?ng -chng m?t -kh mi?ng -?au ??u -ra m? hi nhi?u h?n -bu?n i -m?t m?i ho?c y?u ?t b?t th??ng Danh sch ny c th? khng m t? ?? h?t cc tc d?ng ph? c th? x?y ra. Xin g?i t?i bc s? c?a mnh ?? ???c c? v?n chuyn mn v? cc tc d?ng ph?Sander Nephew v? c th? t??ng trnh cc tc d?ng ph? cho FDA theo s? [phone]. Ti nn c?t gi? thu?c c?a mnh ? ?u? ?? ngoi t?m tay tr? em. C?t gi? ? nhi?t ?? phng t? 15 ??n 30 ?? C (58 ??n 85 ?? F). Trnh nhi?t v nh sng. Dung d?ch ph?i trong su?t. N?u dung d?ch ??i mu ho?c c c?n, th ph?i thay thu?c khc. V?t b? t?t c? thu?c ch?a dng sau ngy h?t h?n in trn nhn thu?c ho?c bao thu?c. Hy h?i bc s? ho?c d??c s? v? cch v?t b? d?ng c? tim thu?c ?ng cch, n?u n b? h?t h?n ho?c ? ???c s? d?ng. Lun lun thay d?ng c? tim thu?c t? ??ng c?a mnh tr??c khi n h?t h?n. L?U : ?y l b?n tm t?t. N c th? khng bao hm t?t c? thng tin c th? c. N?u qu v? th?c m?c v? thu?c ny, xin trao ??i v?i bc s?, d??c s?, ho?c ng??i cung c?p d?ch v? y t? c?a mnh.  2018 Elsevier/Gold Standard (2016-03-26 00:00:00)

## 2016-10-19 NOTE — Telephone Encounter (Signed)
CMA went over lab results with patient in office

## 2016-10-19 NOTE — Telephone Encounter (Signed)
-----   Message from Alfonse Spruce, [place] sent at 10/05/2016 11:09 AM EDT ----- -Lipid levels were elevated. This can increase your risk of heart disease. Your dose of pravastatin will be increased and you will be prescribed aspirin to help lower risk.  -Start eating a diet low in saturated fat. Limit your intake of fried foods, red meats, and whole milk. Increase activity. Vitamin D level was low. Vitamin D helps to keep bones strong. You were prescribed ergocalciferol (capsules) to increase your vitamin-d level.  Liver function normal Recommend follow up in 3 months

## 2016-11-30 ENCOUNTER — Ambulatory Visit (INDEPENDENT_AMBULATORY_CARE_PROVIDER_SITE_OTHER): Payer: Medicare Other | Admitting: Allergy and Immunology

## 2016-11-30 ENCOUNTER — Encounter: Payer: Self-pay | Admitting: Allergy and Immunology

## 2016-11-30 VITALS — BP 122/70 | HR 72 | Resp 16 | Ht <= 58 in

## 2016-11-30 DIAGNOSIS — H1013 Acute atopic conjunctivitis, bilateral: Secondary | ICD-10-CM | POA: Diagnosis not present

## 2016-11-30 DIAGNOSIS — J3089 Other allergic rhinitis: Secondary | ICD-10-CM | POA: Diagnosis not present

## 2016-11-30 DIAGNOSIS — J4541 Moderate persistent asthma with (acute) exacerbation: Secondary | ICD-10-CM | POA: Diagnosis not present

## 2016-11-30 DIAGNOSIS — H1010 Acute atopic conjunctivitis, unspecified eye: Secondary | ICD-10-CM | POA: Insufficient documentation

## 2016-11-30 DIAGNOSIS — J302 Other seasonal allergic rhinitis: Secondary | ICD-10-CM | POA: Insufficient documentation

## 2016-11-30 MED ORDER — FLUTICASONE-SALMETEROL 230-21 MCG/ACT IN AERO: 2.0000 | INHALATION_SPRAY | Freq: Two times a day (BID) | RESPIRATORY_TRACT | 5 refills | Status: DC

## 2016-11-30 MED ORDER — AZELASTINE HCL 0.15 % NA SOLN: 2.0000 | Freq: Two times a day (BID) | NASAL | 5 refills | Status: DC

## 2016-11-30 NOTE — Assessment & Plan Note (Deleted)
   Aeroallergen avoidance measures have been discussed and provided in written form.  A prescription has been provided for azelastine nasal spray, 1-2 sprays per nostril 2 times daily as needed. Proper nasal spray technique has been discussed and demonstrated.   I have also recommended nasal saline spray (i.e., Simply Saline) or nasal saline lavage (i.e., NeilMed) as needed and prior to medicated nasal sprays.

## 2016-11-30 NOTE — Patient Instructions (Addendum)
Moderate persistent asthma Currently with suboptimal control.  We will step up therapy at this time.  A prescription has been provided for Advair 230-21 g HFA, 2 inhalations twice a day. To maximize pulmonary deposition, a spacer has been provided along with instructions for its proper administration with an HFA inhaler.  To hasten symptom relief, prednisone has been provided, 20 mg x 4 days, 10 mg x1 day, then stop.  Continue albuterol HFA, however instead of scheduled use, this medication will be used every 4-6 hours on an as-needed basis.  Subjective and objective measures of pulmonary function will be followed and the treatment plan will be adjusted accordingly.  Seasonal and perennial allergic rhinitis  Aeroallergen avoidance measures have been discussed and provided in written form.  A prescription has been provided for azelastine nasal spray, 1-2 sprays per nostril 2 times daily as needed. Proper nasal spray technique has been discussed and demonstrated.   I have also recommended nasal saline spray (i.e., Simply Saline) or nasal saline lavage (i.e., NeilMed) as needed and prior to medicated nasal sprays.   Return in about 3 months (around 03/01/2017), or if symptoms worsen or fail to improve.  Reducing Pollen Exposure  The American Academy of Allergy, Asthma and Immunology suggests the following steps to reduce your exposure to pollen during allergy seasons.    1. Do not hang sheets or clothing out to dry; pollen may collect on these items. 2. Do not mow lawns or spend time around freshly cut grass; mowing stirs up pollen. 3. Keep windows closed at night.  Keep car windows closed while driving. 4. Minimize morning activities outdoors, a time when pollen counts are usually at their highest. 5. Stay indoors as much as possible when pollen counts or humidity is high and on windy days when pollen tends to remain in the air longer. 6. Use air conditioning when possible.  Many air  conditioners have filters that trap the pollen spores. 7. Use a HEPA room air filter to remove pollen form the indoor air you breathe.   Control of Mold Allergen  Mold and fungi can grow on a variety of surfaces provided certain temperature and moisture conditions exist.  Outdoor molds grow on plants, decaying vegetation and soil.  The major outdoor mold, Alternaria and Cladosporium, are found in very high numbers during hot and dry conditions.  Generally, a late Summer - Fall peak is seen for common outdoor fungal spores.  Rain will temporarily lower outdoor mold spore count, but counts rise rapidly when the rainy period ends.  The most important indoor molds are Aspergillus and Penicillium.  Dark, humid and poorly ventilated basements are ideal sites for mold growth.  The next most common sites of mold growth are the bathroom and the kitchen.  Outdoor [employer] 1. Use air conditioning and keep windows closed 2. Avoid exposure to decaying vegetation. 3. Avoid leaf raking. 4. Avoid grain handling. 5. Consider wearing a face mask if working in moldy areas.  Indoor Mold Control 1. Maintain humidity below 50%. 2. Clean washable surfaces with 5% bleach solution. 3. Remove sources e.g. Contaminated carpets.  Control of House Dust Mite Allergen  House dust mites play a major role in allergic asthma and rhinitis.  They occur in environments with high humidity wherever human skin, the food for dust mites is found. High levels have been detected in dust obtained from mattresses, pillows, carpets, upholstered furniture, bed covers, clothes and soft toys.  The principal allergen of the house  dust mite is found in its feces.  A gram of dust may contain 1,000 mites and 250,000 fecal particles.  Mite antigen is easily measured in the air during house cleaning activities.    1. Encase mattresses, including the box spring, and pillow, in an air tight cover.  Seal the zipper end of the encased mattresses  with wide adhesive tape. 2. Wash the bedding in water of 130 degrees Farenheit weekly.  Avoid cotton comforters/quilts and flannel bedding: the most ideal bed covering is the dacron comforter. 3. Remove all upholstered furniture from the bedroom. 4. Remove carpets, carpet padding, rugs, and non-washable window drapes from the bedroom.  Wash drapes weekly or use plastic window coverings. 5. Remove all non-washable stuffed toys from the bedroom.  Wash stuffed toys weekly. 6. Have the room cleaned frequently with a vacuum cleaner and a damp dust-mop.  The patient should not be in a room which is being cleaned and should wait 1 hour after cleaning before going into the room. 7. Close and seal all heating outlets in the bedroom.  Otherwise, the room will become filled with dust-laden air.  An electric heater can be used to heat the room. 8. Reduce indoor humidity to less than 50%.  Do not use a humidifier.

## 2016-11-30 NOTE — Assessment & Plan Note (Signed)
   Aeroallergen avoidance measures have been discussed and provided in written form.  A prescription has been provided for azelastine nasal spray, 1-2 sprays per nostril 2 times daily as needed. Proper nasal spray technique has been discussed and demonstrated.   I have also recommended nasal saline spray (i.e., Simply Saline) or nasal saline lavage (i.e., NeilMed) as needed and prior to medicated nasal sprays.

## 2016-11-30 NOTE — Assessment & Plan Note (Addendum)
Currently with suboptimal control.  We will step up therapy at this time.  A prescription has been provided for Advair 230-21 g HFA, 2 inhalations twice a day. To maximize pulmonary deposition, a spacer has been provided along with instructions for its proper administration with an HFA inhaler.  To hasten symptom relief, prednisone has been provided, 20 mg x 4 days, 10 mg x1 day, then stop.  Continue albuterol HFA, however instead of scheduled use, this medication will be used every 4-6 hours on an as-needed basis.  Subjective and objective measures of pulmonary function will be followed and the treatment plan will be adjusted accordingly.

## 2016-11-30 NOTE — Progress Notes (Signed)
New Patient Note  RE: Tina [name] MRN: [id number] DOB: 15-Sep-1955 Date of Office Visit: 11/30/2016  Referring provider: Orlando Penner* Primary care provider: Alfonse Spruce, FNP  Chief Complaint: Asthma and Allergic Rhinitis    History of present illness: Tina [name] is a 61 y.o. female seen today in consultation requested by Fredia Beets, FNP.  She is accompanied today by her husband and an interpreter who assists with the history.  She was apparently diagnosed with asthma and 2000.  Her symptoms consist of a nonproductive cough, dyspnea, and wheezing.  The wheezing has progressed significantly over the past 4 years.  She currently takes Advair 100/50, one inhalation daily and albuterol HFA, 1 inhalation twice daily.  Albuterol provides temporary relief.  She has been prescribed montelukast in the past, however discontinued this medication because she did not perceived benefit.  She reports that she is currently experiencing asthma symptoms, predominantly wheezing, every day and every night.  Specific triggers for her asthma symptoms include extremes of temperature and pollen exposure.   She experiences nasal congestion, rhinorrhea, sneezing, nasal pruritus, ocular pruritus, and some postnasal drainage.  The symptoms are most pronounced with pollen exposure.   Assessment and plan: Moderate persistent asthma Currently with suboptimal control.  We will step up therapy at this time.  A prescription has been provided for Advair 230-21 g HFA, 2 inhalations twice a day. To maximize pulmonary deposition, a spacer has been provided along with instructions for its proper administration with an HFA inhaler.  To hasten symptom relief, prednisone has been provided, 20 mg x 4 days, 10 mg x1 day, then stop.  Continue albuterol HFA, however instead of scheduled use, this medication will be used every 4-6 hours on an as-needed basis.  Subjective and objective measures of pulmonary  function will be followed and the treatment plan will be adjusted accordingly.  Seasonal and perennial allergic rhinitis  Aeroallergen avoidance measures have been discussed and provided in written form.  A prescription has been provided for azelastine nasal spray, 1-2 sprays per nostril 2 times daily as needed. Proper nasal spray technique has been discussed and demonstrated.   I have also recommended nasal saline spray (i.e., Simply Saline) or nasal saline lavage (i.e., NeilMed) as needed and prior to medicated nasal sprays.   Meds ordered this encounter  Medications  . fluticasone-salmeterol (ADVAIR HFA) 230-21 MCG/ACT inhaler    Sig: Inhale 2 puffs into the lungs 2 (two) times daily.    Dispense:  1 Inhaler    Refill:  5  . Azelastine HCl 0.15 % SOLN    Sig: Place 2 sprays into both nostrils 2 (two) times daily.    Dispense:  30 mL    Refill:  5    Diagnostics: Spirometry: FVC was 1.63 L and FEV1 was 1.42 L (76% predicted) with 6% postbronchodilator improvement.  Please see scanned spirometry results for details. Epicutaneous testing: Positive to tree pollen. Intradermal testing: Positive to molds and dust mite antigen.    Physical examination: Blood pressure 122/70, pulse 72, resp. rate 16, height 4\' 10"  (1.473 m).  General: Alert, interactive, in no acute distress. HEENT: TMs pearly gray, turbinates moderately edematous without discharge, post-pharynx unremarkable. Neck: Supple without lymphadenopathy. Lungs: Expiratory wheezes bilaterally. CV: Normal S1, S2 without murmurs. Abdomen: Nondistended, nontender. Skin: Warm and dry, without lesions or rashes. Extremities:  No clubbing, cyanosis or edema. Neuro:   Grossly intact.  Review of systems:  Review of systems negative except as  noted in HPI / PMHx or noted below: Review of Systems  Constitutional: Negative.   HENT: Negative.   Eyes: Negative.   Respiratory: Negative.   Cardiovascular: Negative.     Gastrointestinal: Negative.   Genitourinary: Negative.   Musculoskeletal: Negative.   Skin: Negative.   Neurological: Negative.   Endo/Heme/Allergies: Negative.   Psychiatric/Behavioral: Negative.     Past medical history:  Past Medical History:  Diagnosis Date  . Asthma   . Nephrolithiasis    "came out on it's own" (12/21/2012)    Past surgical history:  Past Surgical History:  Procedure Laterality Date  . CHOLECYSTECTOMY N/A 12/21/2012   Procedure: LAPAROSCOPIC CHOLECYSTECTOMY;  Surgeon: Ralene Ok, MD;  Location: [facility];  Service: General;  Laterality: N/A;  . KIDNEY STONE SURGERY     spouse denies this hx on 12/21/2012  . LAPAROSCOPIC CHOLECYSTECTOMY  12/21/2012    Family history: Family History  Problem Relation Age of Onset  . Tuberculosis Mother   . Asthma Mother   . Asthma Father   . Tuberculosis Father     Social history: Social History   Social History  . Marital status: Married    Spouse name: N/A  . Number of children: N/A  . Years of education: N/A   Occupational History  . Not on file.   Social History Main Topics  . Smoking status: Never Smoker  . Smokeless tobacco: Never Used  . Alcohol use No  . Drug use: No  . Sexual activity: Yes   Other Topics Concern  . Not on file   Social History Narrative  . No narrative on file   Environmental History: The patient lives in a 62 year old house with hardwood floors throughout, gas heat, and central air.  She is a nonsmoker without pets.  There is no known mold/water damage in the home.  Allergies as of 11/30/2016      Reactions   Hctz [hydrochlorothiazide] Shortness Of Breath   Lisinopril Shortness Of Breath      Medication List       Accurate as of 11/30/16  1:52 PM. Always use your most recent med list.          albuterol 108 (90 Base) MCG/ACT inhaler Commonly known as:  PROVENTIL HFA;VENTOLIN HFA Inhale 2 puffs into the lungs every 6 (six) hours as needed for wheezing or  shortness of breath.   aspirin EC 81 MG tablet Take 1 tablet (81 mg total) by mouth daily.   Azelastine HCl 0.15 % Soln Place 2 sprays into both nostrils 2 (two) times daily.   cetirizine 10 MG tablet Commonly known as:  ZYRTEC Take 1 tablet (10 mg total) by mouth daily.   cyclobenzaprine 10 MG tablet Commonly known as:  FLEXERIL Take 1 tablet (10 mg total) by mouth at bedtime as needed for muscle spasms.   diphenhydrAMINE 25 mg capsule Commonly known as:  BENADRYL Take 1 capsule (25 mg total) by mouth every 6 (six) hours as needed for itching or allergies.   fluticasone 50 MCG/ACT nasal spray Commonly known as:  FLONASE Place 2 sprays into both nostrils daily.   fluticasone-salmeterol 230-21 MCG/ACT inhaler Commonly known as:  ADVAIR HFA Inhale 2 puffs into the lungs 2 (two) times daily.   glucose blood test strip Commonly known as:  TRUE METRIX BLOOD GLUCOSE TEST Use as instructed   metFORMIN 500 MG 24 hr tablet Commonly known as:  GLUCOPHAGE XR Take 1 tablet (500 mg total) by mouth 2 (two)  times daily with a meal.   montelukast 10 MG tablet Commonly known as:  SINGULAIR Take 1 tablet (10 mg total) by mouth at bedtime.   naproxen 500 MG tablet Commonly known as:  NAPROSYN Take 1 tablet (500 mg total) by mouth 2 (two) times daily with a meal. For 10 days. Then take one tablet by mouth daily with meal as needed.   pantoprazole 40 MG tablet Commonly known as:  PROTONIX Take 1 tablet (40 mg total) by mouth daily.   polyethylene glycol powder powder Commonly known as:  GLYCOLAX/MIRALAX Take 17 g by mouth daily.   pravastatin 80 MG tablet Commonly known as:  PRAVACHOL Take 1 tablet (80 mg total) by mouth daily.   Vitamin D (Ergocalciferol) 50000 units Caps capsule Commonly known as:  DRISDOL TAKE ONE TABLET BY MOUTH ONCE A WEEK FOR 12 WEEKS.            Discharge Care Instructions        Start     Ordered   11/30/16 0000  Spirometry with Graph      Question Answer Comment  Where should this test be performed? Other   Basic spirometry No   Spirometry pre & post bronchodilator Yes      11/30/16 1301   11/30/16 0000  Allergy Test    Question:  Allergy test to perform  Answer:  adult panel   11/30/16 1301   11/30/16 0000  Interdermal Allergy Test    Question Answer Comment  Allergens Control   Allergens [place]   Allergens Johnson   Allergens 7 Grass   Allergens Weed Mix   Allergens Mold 1   Allergens Mold 2   Allergens Mold 3   Allergens Mold 4   Allergens Cat   Allergens Dog   Allergens Cockroach   Allergens Mite Mix   Allergens Ragweed Mix      11/30/16 1301   11/30/16 0000  fluticasone-salmeterol (ADVAIR HFA) 230-21 MCG/ACT inhaler  2 times daily     11/30/16 1301   11/30/16 0000  Azelastine HCl 0.15 % SOLN  2 times daily     11/30/16 1301      Known medication allergies: Allergies  Allergen Reactions  . Hctz [Hydrochlorothiazide] Shortness Of Breath  . Lisinopril Shortness Of Breath    I appreciate the opportunity to take part in Perian's care. Please do not hesitate to contact me with questions.  Sincerely,   R. Edgar Frisk, MD

## 2016-12-10 DIAGNOSIS — E119 Type 2 diabetes mellitus without complications: Secondary | ICD-10-CM | POA: Diagnosis not present

## 2016-12-10 DIAGNOSIS — H2513 Age-related nuclear cataract, bilateral: Secondary | ICD-10-CM | POA: Diagnosis not present

## 2016-12-10 DIAGNOSIS — H25013 Cortical age-related cataract, bilateral: Secondary | ICD-10-CM | POA: Diagnosis not present

## 2017-01-18 ENCOUNTER — Other Ambulatory Visit: Payer: Self-pay | Admitting: Family Medicine

## 2017-01-18 DIAGNOSIS — Z760 Encounter for issue of repeat prescription: Secondary | ICD-10-CM

## 2017-01-20 ENCOUNTER — Ambulatory Visit: Payer: Medicare Other | Admitting: Family Medicine

## 2017-02-08 ENCOUNTER — Encounter: Payer: Self-pay | Admitting: Family Medicine

## 2017-02-08 ENCOUNTER — Ambulatory Visit: Payer: Medicare Other | Attending: Family Medicine | Admitting: Family Medicine

## 2017-02-08 VITALS — BP 108/67 | HR 73 | Temp 98.0°F | Resp 18 | Ht <= 58 in | Wt 121.8 lb

## 2017-02-08 DIAGNOSIS — Z79899 Other long term (current) drug therapy: Secondary | ICD-10-CM | POA: Diagnosis not present

## 2017-02-08 DIAGNOSIS — J4540 Moderate persistent asthma, uncomplicated: Secondary | ICD-10-CM | POA: Diagnosis not present

## 2017-02-08 DIAGNOSIS — Z23 Encounter for immunization: Secondary | ICD-10-CM | POA: Insufficient documentation

## 2017-02-08 DIAGNOSIS — Z7982 Long term (current) use of aspirin: Secondary | ICD-10-CM | POA: Diagnosis not present

## 2017-02-08 DIAGNOSIS — R7303 Prediabetes: Secondary | ICD-10-CM | POA: Insufficient documentation

## 2017-02-08 DIAGNOSIS — Z7984 Long term (current) use of oral hypoglycemic drugs: Secondary | ICD-10-CM | POA: Diagnosis not present

## 2017-02-08 DIAGNOSIS — Z760 Encounter for issue of repeat prescription: Secondary | ICD-10-CM | POA: Insufficient documentation

## 2017-02-08 DIAGNOSIS — I10 Essential (primary) hypertension: Secondary | ICD-10-CM | POA: Diagnosis not present

## 2017-02-08 LAB — GLUCOSE, POCT (MANUAL RESULT ENTRY): POC GLUCOSE: 124 mg/dL — AB (ref 70–99)

## 2017-02-08 MED ORDER — METFORMIN HCL ER 500 MG PO TB24: 500.0000 mg | ORAL_TABLET | Freq: Every day | ORAL | 2 refills | Status: DC

## 2017-02-08 MED ORDER — FLUTICASONE-SALMETEROL 230-21 MCG/ACT IN AERO: 2.0000 | INHALATION_SPRAY | Freq: Two times a day (BID) | RESPIRATORY_TRACT | 5 refills | Status: DC

## 2017-02-08 MED ORDER — MONTELUKAST SODIUM 10 MG PO TABS: 10.0000 mg | ORAL_TABLET | Freq: Every day | ORAL | 3 refills | Status: DC

## 2017-02-08 MED ORDER — ALBUTEROL SULFATE HFA 108 (90 BASE) MCG/ACT IN AERS: 2.0000 | INHALATION_SPRAY | Freq: Four times a day (QID) | RESPIRATORY_TRACT | 3 refills | Status: DC | PRN

## 2017-02-08 MED ORDER — PANTOPRAZOLE SODIUM 40 MG PO TBEC: DELAYED_RELEASE_TABLET | ORAL | 0 refills | Status: DC

## 2017-02-08 NOTE — Progress Notes (Signed)
Patient is here for f/up  

## 2017-02-09 LAB — HEMOGLOBIN A1C
ESTIMATED AVERAGE GLUCOSE: 117 mg/dL
HEMOGLOBIN A1C: 5.7 % — AB (ref 4.8–5.6)

## 2017-02-09 NOTE — Progress Notes (Signed)
Subjective:  Patient ID: Tina [name], female    DOB: 1955-03-20  Age: 61 y.o. MRN: [id number]  CC: Follow-up   HPI Tina [name] presents for history of HTN and prediabetes. She is accompanied by her husband. [employer] interpreter did not come to office visit. Patient's husband was able to contact a [place] interpreter Janie Morning with [employer]. Permission was obtained from patient, patient is agreeable.  History of prediabetes. patient denies nausea, paresthesia of the feet, polydipsia, polyuria and vomiting.  Evaluation to date has been included: hemoglobin A1C.  Home sugars: patient does not check sugars. Treatment to date: metformin.  She reports side effect of heart fluttering with twice daily dosage and reports only taking 1 tablet once a day.  She reports improvement of symptom with 1 tablet use.  She reports taking metformin and atorvastatin medications for 1 week but discontinued due to symptom.  History of hypertension: She is not exercising and is adherent to low salt diet.  Blood pressure is well controlled at home. Cardiac symptoms none. Patient denies chest pain, chest pressure/discomfort, claudication, dyspnea, lower extremity edema, orthopnea, palpitations and syncope.  Cardiovascular risk factors: dyslipidemia, hypertension and sedentary lifestyle. Use of agents associated with hypertension: none. History of target organ damage: none.  History of asthma she denies increased rescue inhaler use.  She reports using inhalers as prescribed.   Outpatient Medications Prior to Visit  Medication Sig Dispense Refill  . aspirin EC 81 MG tablet Take 1 tablet (81 mg total) by mouth daily. 90 tablet 3  . Azelastine HCl 0.15 % SOLN Place 2 sprays into both nostrils 2 (two) times daily. 30 mL 5  . cetirizine (ZYRTEC) 10 MG tablet Take 1 tablet (10 mg total) by mouth daily. 30 tablet 11  . cyclobenzaprine (FLEXERIL) 10 MG tablet Take 1 tablet (10 mg total) by mouth at bedtime as  needed for muscle spasms. 30 tablet 0  . diphenhydrAMINE (BENADRYL) 25 mg capsule Take 1 capsule (25 mg total) by mouth every 6 (six) hours as needed for itching or allergies. 30 capsule 0  . fluticasone (FLONASE) 50 MCG/ACT nasal spray Place 2 sprays into both nostrils daily. (Patient not taking: Reported on 11/30/2016) 16 g 11  . glucose blood (TRUE METRIX BLOOD GLUCOSE TEST) test strip Use as instructed 100 each 12  . naproxen (NAPROSYN) 500 MG tablet Take 1 tablet (500 mg total) by mouth 2 (two) times daily with a meal. For 10 days. Then take one tablet by mouth daily with meal as needed. 60 tablet 0  . polyethylene glycol powder (GLYCOLAX/MIRALAX) powder Take 17 g by mouth daily. 3350 g 2  . pravastatin (PRAVACHOL) 80 MG tablet Take 1 tablet (80 mg total) by mouth daily. 90 tablet 3  . Vitamin D, Ergocalciferol, (DRISDOL) 50000 units CAPS capsule TAKE ONE TABLET BY MOUTH ONCE A WEEK FOR 12 WEEKS. 12 capsule 0  . albuterol (PROVENTIL HFA;VENTOLIN HFA) 108 (90 Base) MCG/ACT inhaler Inhale 2 puffs into the lungs every 6 (six) hours as needed for wheezing or shortness of breath. 1 Inhaler 3  . fluticasone-salmeterol (ADVAIR HFA) 230-21 MCG/ACT inhaler Inhale 2 puffs into the lungs 2 (two) times daily. 1 Inhaler 5  . metFORMIN (GLUCOPHAGE XR) 500 MG 24 hr tablet Take 1 tablet (500 mg total) by mouth 2 (two) times daily with a meal. 60 tablet 2  . montelukast (SINGULAIR) 10 MG tablet Take 1 tablet (10 mg total) by mouth at bedtime. 90 tablet 3  .  pantoprazole (PROTONIX) 40 MG tablet TAKE 1 TABLET BY MOUTH EVERY DAY 90 tablet 0   No facility-administered medications prior to visit.     ROS Review of Systems  Constitutional: Negative.   Eyes: Negative.   Respiratory: Negative.   Cardiovascular: Negative.   Gastrointestinal: Negative.   Skin: Negative.   Neurological: Negative.     Objective:  BP 108/67 (BP Location: Left Arm, Patient Position: Sitting, Cuff Size: Normal)   Pulse 73   Temp  98 F (36.7 C) (Oral)   Resp 18   Ht 4\' 10"  (1.473 m)   Wt 121 lb 12.8 oz (55.2 kg)   SpO2 96%   BMI 25.46 kg/m   BP/Weight 02/08/2017 11/30/2016 0/93/8182  Systolic BP 993 716 967  Diastolic BP 67 70 70  Wt. (Lbs) 121.8 - 121  BMI 25.46 - 25.29     Physical Exam  Constitutional: She is oriented to person, place, and time. She appears well-developed and well-nourished.  HENT:  Head: Normocephalic and atraumatic.  Right Ear: External ear normal.  Left Ear: External ear normal.  Nose: Nose normal.  Mouth/Throat: Oropharynx is clear and moist.  Eyes: Conjunctivae are normal. Pupils are equal, round, and reactive to light.  Neck: Normal range of motion. Neck supple. No JVD present.  Cardiovascular: Normal rate, regular rhythm, normal heart sounds and intact distal pulses.  Pulmonary/Chest: Effort normal and breath sounds normal.  Abdominal: Soft. Bowel sounds are normal. There is no tenderness.  Musculoskeletal: Normal range of motion.  Neurological: She is alert and oriented to person, place, and time.  Skin: Skin is warm and dry.  Nursing note and vitals reviewed.  Assessment & Plan:   1. Moderate persistent asthma without complication  - montelukast (SINGULAIR) 10 MG tablet; Take 1 tablet (10 mg total) by mouth at bedtime.  Dispense: 90 tablet; Refill: 3 - albuterol (PROVENTIL HFA;VENTOLIN HFA) 108 (90 Base) MCG/ACT inhaler; Inhale 2 puffs into the lungs every 6 (six) hours as needed for wheezing or shortness of breath.  Dispense: 1 Inhaler; Refill: 3 - fluticasone-salmeterol (ADVAIR HFA) 230-21 MCG/ACT inhaler; Inhale 2 puffs into the lungs 2 (two) times daily.  Dispense: 1 Inhaler; Refill: 5  2. Prediabetes Decrease metformin dosage to 1 tablet daily - Glucose (CBG) - metFORMIN (GLUCOPHAGE XR) 500 MG 24 hr tablet; Take 1 tablet (500 mg total) by mouth daily with breakfast.  Dispense: 60 tablet; Refill: 2 - Hemoglobin A1c  3. Needs flu shot  - Flu Vaccine QUAD 6+ mos  PF IM (Fluarix Quad PF)  4. Medication refill  - pantoprazole (PROTONIX) 40 MG tablet; TAKE ONE TABLE BY MOUTH AS NEEDED FOR HEARTBURN OR ACID REFLUX.  Dispense: 90 tablet; Refill: 0     Follow-up: Return in about 3 months (around 05/09/2017), or if symptoms worsen or fail to improve, for Asthma.   Alfonse Spruce FNP

## 2017-02-22 ENCOUNTER — Ambulatory Visit: Payer: Medicare Other | Admitting: Allergy and Immunology

## 2017-03-15 ENCOUNTER — Encounter: Payer: Self-pay | Admitting: Allergy and Immunology

## 2017-03-15 ENCOUNTER — Ambulatory Visit (INDEPENDENT_AMBULATORY_CARE_PROVIDER_SITE_OTHER): Payer: Medicare Other | Admitting: Allergy and Immunology

## 2017-03-15 VITALS — BP 100/64 | HR 80 | Resp 20

## 2017-03-15 DIAGNOSIS — J302 Other seasonal allergic rhinitis: Secondary | ICD-10-CM | POA: Diagnosis not present

## 2017-03-15 DIAGNOSIS — K219 Gastro-esophageal reflux disease without esophagitis: Secondary | ICD-10-CM | POA: Diagnosis not present

## 2017-03-15 DIAGNOSIS — J4540 Moderate persistent asthma, uncomplicated: Secondary | ICD-10-CM

## 2017-03-15 DIAGNOSIS — H1013 Acute atopic conjunctivitis, bilateral: Secondary | ICD-10-CM

## 2017-03-15 DIAGNOSIS — J3089 Other allergic rhinitis: Secondary | ICD-10-CM

## 2017-03-15 MED ORDER — MOMETASONE FURO-FORMOTEROL FUM 200-5 MCG/ACT IN AERO: 2.0000 | INHALATION_SPRAY | Freq: Two times a day (BID) | RESPIRATORY_TRACT | 5 refills | Status: DC

## 2017-03-15 NOTE — Progress Notes (Signed)
Follow-up Note  RE: Tina [name] MRN: [id number] DOB: 28-Aug-1955 Date of Office Visit: 03/15/2017  Primary care provider: Alfonse Spruce, FNP Referring provider: Alfonse Spruce, F*  History of present illness: Tina [name] is a 62 y.o. female with persistent asthma and allergic rhinitis presenting today for follow-up.  She was previously seen in this clinic for her initial evaluation on November 30, 2016.  She is accompanied today by an interpreter.  She has been taking Advair 230/21 g, 2 inhalations twice daily.  For unclear reasons she has not been using a spacer device.  Currently she requires albuterol rescue 1 time per week on average, typically for dry cough and occasional wheezing.  She reports that her insurance is no longer paying for Advair and she would like to switch controller medications.  Her nasal symptoms have been well controlled with azelastine nasal spray, 1 spray per nostril twice daily as needed.  She reports that she discontinued Protonix, however denies reflux symptoms.   Assessment and plan: Moderate persistent asthma Todays spirometry results, assessed while asymptomatic, suggest under-perception of bronchoconstriction.  As her insurance no longer covers Advair, we will switch to Dulera 200-5 g, 2 inhalations twice daily.  A sample and prescription have been provided.  To maximize pulmonary deposition, a spacer has been provided along with instructions for its proper administration with an HFA inhaler.  Continue montelukast 10 mg daily at bedtime and albuterol HFA, 1-2 inhalations every 4-6 hours if needed.  Subjective and objective measures of pulmonary function will be followed and the treatment plan will be adjusted accordingly.  Seasonal and perennial allergic rhinitis  Continue appropriate allergen avoidance measures, montelukast daily, and azelastine nasal spray as needed.  Nasal saline spray (i.e. Simply Saline) is recommended prior to  medicated nasal sprays and as needed.  Gastroesophageal reflux disease  Continue appropriate reflux lifestyle modifications and ranitidine 150 mg twice daily if needed.   Meds ordered this encounter  Medications  . mometasone-formoterol (DULERA) 200-5 MCG/ACT AERO    Sig: Inhale 2 puffs into the lungs 2 (two) times daily.    Dispense:  1 Inhaler    Refill:  5    Diagnostics: Amatory reveals an FVC of 1.28 L and an FEV1 of 1.19 L (65% predicted) with significant (15%) postbronchodilator improvement.  This study was performed while the patient was asymptomatic.  Please see scanned spirometry results for details.    Physical examination: Blood pressure 100/64, pulse 80, resp. rate 20.  General: Alert, interactive, in no acute distress. HEENT: TMs pearly gray, turbinates mildly edematous without discharge, post-pharynx unremarkable. Neck: Supple without lymphadenopathy. Lungs: Mildly decreased breath sounds bilaterally without wheezing, rhonchi or rales. CV: Normal S1, S2 without murmurs. Skin: Warm and dry, without lesions or rashes.  The following portions of the patient's history were reviewed and updated as appropriate: allergies, current medications, past family history, past medical history, past social history, past surgical history and problem list.  Allergies as of 03/15/2017      Reactions   Hctz [hydrochlorothiazide] Shortness Of Breath   Lisinopril Shortness Of Breath      Medication List        Accurate as of 03/15/17 12:48 PM. Always use your most recent med list.          albuterol 108 (90 Base) MCG/ACT inhaler Commonly known as:  PROVENTIL HFA;VENTOLIN HFA Inhale 2 puffs into the lungs every 6 (six) hours as needed for wheezing or shortness of breath.  aspirin EC 81 MG tablet Take 1 tablet (81 mg total) by mouth daily.   Azelastine HCl 0.15 % Soln Place 2 sprays into both nostrils 2 (two) times daily.   cetirizine 10 MG tablet Commonly known as:   ZYRTEC Take 1 tablet (10 mg total) by mouth daily.   cyclobenzaprine 10 MG tablet Commonly known as:  FLEXERIL Take 1 tablet (10 mg total) by mouth at bedtime as needed for muscle spasms.   diphenhydrAMINE 25 mg capsule Commonly known as:  BENADRYL Take 1 capsule (25 mg total) by mouth every 6 (six) hours as needed for itching or allergies.   fluticasone 50 MCG/ACT nasal spray Commonly known as:  FLONASE Place 2 sprays into both nostrils daily.   glucose blood test strip Commonly known as:  TRUE METRIX BLOOD GLUCOSE TEST Use as instructed   metFORMIN 500 MG 24 hr tablet Commonly known as:  GLUCOPHAGE XR Take 1 tablet (500 mg total) by mouth daily with breakfast.   mometasone-formoterol 200-5 MCG/ACT Aero Commonly known as:  DULERA Inhale 2 puffs into the lungs 2 (two) times daily.   montelukast 10 MG tablet Commonly known as:  SINGULAIR Take 1 tablet (10 mg total) by mouth at bedtime.   naproxen 500 MG tablet Commonly known as:  NAPROSYN Take 1 tablet (500 mg total) by mouth 2 (two) times daily with a meal. For 10 days. Then take one tablet by mouth daily with meal as needed.   pantoprazole 40 MG tablet Commonly known as:  PROTONIX TAKE ONE TABLE BY MOUTH AS NEEDED FOR HEARTBURN OR ACID REFLUX.   polyethylene glycol powder powder Commonly known as:  GLYCOLAX/MIRALAX Take 17 g by mouth daily.   pravastatin 80 MG tablet Commonly known as:  PRAVACHOL Take 1 tablet (80 mg total) by mouth daily.   Vitamin D (Ergocalciferol) 50000 units Caps capsule Commonly known as:  DRISDOL TAKE ONE TABLET BY MOUTH ONCE A WEEK FOR 12 WEEKS.       Allergies  Allergen Reactions  . Hctz [Hydrochlorothiazide] Shortness Of Breath  . Lisinopril Shortness Of Breath   Review of systems: Review of systems negative except as noted in HPI / PMHx or noted below: Constitutional: Negative.  HENT: Negative.   Eyes: Negative.  Respiratory: Negative.   Cardiovascular: Negative.    Gastrointestinal: Negative.  Genitourinary: Negative.  Musculoskeletal: Negative.  Neurological: Negative.  Endo/Heme/Allergies: Negative.  Cutaneous: Negative.  Past Medical History:  Diagnosis Date  . Asthma   . Nephrolithiasis    "came out on it's own" (12/21/2012)    Family History  Problem Relation Age of Onset  . Tuberculosis Mother   . Asthma Mother   . Asthma Father   . Tuberculosis Father     Social History   Socioeconomic History  . Marital status: Married    Spouse name: Not on file  . Number of children: Not on file  . Years of education: Not on file  . Highest education level: Not on file  Social Needs  . Financial resource strain: Not on file  . Food insecurity - worry: Not on file  . Food insecurity - inability: Not on file  . Transportation needs - medical: Not on file  . Transportation needs - non-medical: Not on file  Occupational History  . Not on file  Tobacco Use  . Smoking status: Never Smoker  . Smokeless tobacco: Never Used  Substance and Sexual Activity  . Alcohol use: No  . Drug use: No  .  Sexual activity: Yes  Other Topics Concern  . Not on file  Social History Narrative  . Not on file    I appreciate the opportunity to take part in Tina [name]'s care. Please do not hesitate to contact me with questions.  Sincerely,   R. Edgar Frisk, MD

## 2017-03-15 NOTE — Assessment & Plan Note (Signed)
   Continue appropriate reflux lifestyle modifications and ranitidine 150 mg twice daily if needed. 

## 2017-03-15 NOTE — Patient Instructions (Addendum)
Moderate persistent asthma Todays spirometry results, assessed while asymptomatic, suggest under-perception of bronchoconstriction.  As her insurance no longer covers Advair, we will switch to Dulera 200-5 g, 2 inhalations twice daily.  A sample and prescription have been provided.  To maximize pulmonary deposition, a spacer has been provided along with instructions for its proper administration with an HFA inhaler.  Continue montelukast 10 mg daily at bedtime and albuterol HFA, 1-2 inhalations every 4-6 hours if needed.  Subjective and objective measures of pulmonary function will be followed and the treatment plan will be adjusted accordingly.  Seasonal and perennial allergic rhinitis  Continue appropriate allergen avoidance measures, montelukast daily, and azelastine nasal spray as needed.  Nasal saline spray (i.e. Simply Saline) is recommended prior to medicated nasal sprays and as needed.  Gastroesophageal reflux disease  Continue appropriate reflux lifestyle modifications and ranitidine 150 mg twice daily if needed.   Return in about 4 months (around 07/13/2017), or if symptoms worsen or fail to improve.

## 2017-03-15 NOTE — Assessment & Plan Note (Signed)
   Continue appropriate allergen avoidance measures, montelukast daily, and azelastine nasal spray as needed.  Nasal saline spray (i.e. Simply Saline) is recommended prior to medicated nasal sprays and as needed.

## 2017-03-15 NOTE — Assessment & Plan Note (Addendum)
Todays spirometry results, assessed while asymptomatic, suggest under-perception of bronchoconstriction.  As her insurance no longer covers Advair, we will switch to Dulera 200-5 g, 2 inhalations twice daily.  A sample and prescription have been provided.  To maximize pulmonary deposition, a spacer has been provided along with instructions for its proper administration with an HFA inhaler.  Continue montelukast 10 mg daily at bedtime and albuterol HFA, 1-2 inhalations every 4-6 hours if needed.  Subjective and objective measures of pulmonary function will be followed and the treatment plan will be adjusted accordingly.

## 2017-05-10 ENCOUNTER — Ambulatory Visit: Payer: Medicare Other | Admitting: Family Medicine

## 2017-05-21 ENCOUNTER — Ambulatory Visit: Payer: Medicare Other | Attending: Family Medicine | Admitting: Nurse Practitioner

## 2017-05-21 ENCOUNTER — Encounter: Payer: Self-pay | Admitting: Nurse Practitioner

## 2017-05-21 VITALS — BP 147/84 | HR 66 | Temp 98.0°F | Ht <= 58 in | Wt 125.8 lb

## 2017-05-21 DIAGNOSIS — Z9049 Acquired absence of other specified parts of digestive tract: Secondary | ICD-10-CM | POA: Diagnosis not present

## 2017-05-21 DIAGNOSIS — J4540 Moderate persistent asthma, uncomplicated: Secondary | ICD-10-CM

## 2017-05-21 DIAGNOSIS — Z9889 Other specified postprocedural states: Secondary | ICD-10-CM | POA: Diagnosis not present

## 2017-05-21 DIAGNOSIS — Z825 Family history of asthma and other chronic lower respiratory diseases: Secondary | ICD-10-CM | POA: Insufficient documentation

## 2017-05-21 DIAGNOSIS — Z79899 Other long term (current) drug therapy: Secondary | ICD-10-CM | POA: Insufficient documentation

## 2017-05-21 DIAGNOSIS — Z7984 Long term (current) use of oral hypoglycemic drugs: Secondary | ICD-10-CM | POA: Insufficient documentation

## 2017-05-21 DIAGNOSIS — Z7982 Long term (current) use of aspirin: Secondary | ICD-10-CM | POA: Insufficient documentation

## 2017-05-21 DIAGNOSIS — J3089 Other allergic rhinitis: Secondary | ICD-10-CM

## 2017-05-21 DIAGNOSIS — Z791 Long term (current) use of non-steroidal anti-inflammatories (NSAID): Secondary | ICD-10-CM | POA: Diagnosis not present

## 2017-05-21 DIAGNOSIS — R7303 Prediabetes: Secondary | ICD-10-CM | POA: Diagnosis not present

## 2017-05-21 DIAGNOSIS — Z888 Allergy status to other drugs, medicaments and biological substances status: Secondary | ICD-10-CM | POA: Insufficient documentation

## 2017-05-21 DIAGNOSIS — R079 Chest pain, unspecified: Secondary | ICD-10-CM | POA: Insufficient documentation

## 2017-05-21 DIAGNOSIS — Z1231 Encounter for screening mammogram for malignant neoplasm of breast: Secondary | ICD-10-CM | POA: Diagnosis not present

## 2017-05-21 DIAGNOSIS — R000 Tachycardia, unspecified: Secondary | ICD-10-CM | POA: Diagnosis not present

## 2017-05-21 MED ORDER — AZELASTINE HCL 0.15 % NA SOLN: 2.0000 | Freq: Two times a day (BID) | NASAL | 5 refills | Status: DC

## 2017-05-21 MED ORDER — MONTELUKAST SODIUM 10 MG PO TABS: 10.0000 mg | ORAL_TABLET | Freq: Every day | ORAL | 3 refills | Status: DC

## 2017-05-21 MED FILL — MONTELUKAST SOD 10 MG TAB: 10 | 90 days supply | Qty: 90 | Fill #0

## 2017-05-21 MED FILL — AZELASTINE 0.15% NASAL SPRY: 0.15 | 30 days supply | Qty: 30 | Fill #0

## 2017-05-21 NOTE — Progress Notes (Signed)
Assessment & Plan:  Tina [name] was seen today for establish care and tachycardia.  Diagnoses and all orders for this visit:  Moderate persistent asthma without complication -     montelukast (SINGULAIR) 10 MG tablet; Take 1 tablet (10 mg total) by mouth at bedtime.  Other allergic rhinitis -     Azelastine HCl 0.15 % SOLN; Place 2 sprays into both nostrils 2 (two) times daily.  Breast cancer screening by mammogram -     MM SCREENING BREAST TOMO BILATERAL; Future  Prediabetes    Patient has been counseled on age-appropriate routine health concerns for screening and prevention. These are reviewed and up-to-date. Referrals have been placed accordingly. Immunizations are up-to-date or declined.    Subjective:   Chief Complaint  Patient presents with  . Establish Care    Patient is here to establish care for Ashtma.   . Tachycardia    Pt's stated when she sleep at night her heart beat fast and she can't sleep. Hard to breathe, and little chest pain.    HPI Tina [name] 62 y.o. female presents to office today to establish. She has an interpreter here with her. Blood pressure is slightly elevated today. She does check her blood pressure at home and reports "normal" readings as baseline.     Prediabetes Chronic. Ac is well controlled. He takes metformin 500 mg daily. She does not monitor her blood sugars at home.  She denies any hypo or hyperglycemic symptoms.  Lab Results  Component Value Date   HGBA1C 5.7 (H) 02/08/2017   Asthma She sees the allergist routinely for her persistent asthma and allergic rhinitis. She reports symptoms are currently controlled. She does endorse palpitations likely related to her inhalers. She denies any chest pain, shortness of breath, cough or wheezing.   Review of Systems  Constitutional: Negative for fever, malaise/fatigue and weight loss.  HENT: Negative.  Negative for nosebleeds.   Eyes: Negative.  Negative for blurred vision, double vision and  photophobia.  Respiratory: Negative.  Negative for cough and shortness of breath.   Cardiovascular: Negative.  Negative for chest pain, palpitations and leg swelling.  Gastrointestinal: Negative.  Negative for heartburn, nausea and vomiting.  Musculoskeletal: Negative.  Negative for myalgias.  Neurological: Negative.  Negative for dizziness, focal weakness, seizures and headaches.  Endo/Heme/Allergies: Positive for environmental allergies.  Psychiatric/Behavioral: Negative.  Negative for suicidal ideas.    Past Medical History:  Diagnosis Date  . Asthma   . Nephrolithiasis    "came out on it's own" (12/21/2012)  . Prediabetes     Past Surgical History:  Procedure Laterality Date  . CHOLECYSTECTOMY N/A 12/21/2012   Procedure: LAPAROSCOPIC CHOLECYSTECTOMY;  Surgeon: Ralene Ok, MD;  Location: [place];  Service: General;  Laterality: N/A;  . KIDNEY STONE SURGERY     spouse denies this hx on 12/21/2012  . LAPAROSCOPIC CHOLECYSTECTOMY  12/21/2012    Family History  Problem Relation Age of Onset  . Tuberculosis Mother   . Asthma Mother   . Asthma Father   . Tuberculosis Father     Social History Reviewed with no changes to be made today.   Outpatient Medications Prior to Visit  Medication Sig Dispense Refill  . albuterol (PROVENTIL HFA;VENTOLIN HFA) 108 (90 Base) MCG/ACT inhaler Inhale 2 puffs into the lungs every 6 (six) hours as needed for wheezing or shortness of breath. 1 Inhaler 3  . aspirin EC 81 MG tablet Take 1 tablet (81 mg total) by mouth daily. 90 tablet  3  . cetirizine (ZYRTEC) 10 MG tablet Take 1 tablet (10 mg total) by mouth daily. 30 tablet 11  . Fluticasone-Salmeterol (ADVAIR) 100-50 MCG/DOSE AEPB Inhale 1 puff into the lungs 2 (two) times daily.    Marland Kitchen glucose blood (TRUE METRIX BLOOD GLUCOSE TEST) test strip Use as instructed 100 each 12  . metFORMIN (GLUCOPHAGE XR) 500 MG 24 hr tablet Take 1 tablet (500 mg total) by mouth daily with breakfast. 60 tablet 2  .  Azelastine HCl 0.15 % SOLN Place 2 sprays into both nostrils 2 (two) times daily. 30 mL 5  . montelukast (SINGULAIR) 10 MG tablet Take 1 tablet (10 mg total) by mouth at bedtime. 90 tablet 3  . cyclobenzaprine (FLEXERIL) 10 MG tablet Take 1 tablet (10 mg total) by mouth at bedtime as needed for muscle spasms. (Patient not taking: Reported on 05/21/2017) 30 tablet 0  . diphenhydrAMINE (BENADRYL) 25 mg capsule Take 1 capsule (25 mg total) by mouth every 6 (six) hours as needed for itching or allergies. (Patient not taking: Reported on 03/15/2017) 30 capsule 0  . Vitamin D, Ergocalciferol, (DRISDOL) 50000 units CAPS capsule TAKE ONE TABLET BY MOUTH ONCE A WEEK FOR 12 WEEKS. (Patient not taking: Reported on 05/21/2017) 12 capsule 0  . fluticasone (FLONASE) 50 MCG/ACT nasal spray Place 2 sprays into both nostrils daily. (Patient not taking: Reported on 05/21/2017) 16 g 11  . fluticasone-salmeterol (ADVAIR HFA) 230-21 MCG/ACT inhaler Inhale 2 puffs into the lungs 2 (two) times daily.    . mometasone-formoterol (DULERA) 200-5 MCG/ACT AERO Inhale 2 puffs into the lungs 2 (two) times daily. (Patient not taking: Reported on 05/21/2017) 1 Inhaler 5  . naproxen (NAPROSYN) 500 MG tablet Take 1 tablet (500 mg total) by mouth 2 (two) times daily with a meal. For 10 days. Then take one tablet by mouth daily with meal as needed. (Patient not taking: Reported on 05/21/2017) 60 tablet 0  . pantoprazole (PROTONIX) 40 MG tablet TAKE ONE TABLE BY MOUTH AS NEEDED FOR HEARTBURN OR ACID REFLUX. (Patient not taking: Reported on 05/21/2017) 90 tablet 0  . polyethylene glycol powder (GLYCOLAX/MIRALAX) powder Take 17 g by mouth daily. (Patient not taking: Reported on 05/21/2017) 3350 g 2  . pravastatin (PRAVACHOL) 80 MG tablet Take 1 tablet (80 mg total) by mouth daily. (Patient not taking: Reported on 05/21/2017) 90 tablet 3   No facility-administered medications prior to visit.     Allergies  Allergen Reactions  . Hctz  [Hydrochlorothiazide] Shortness Of Breath  . Lisinopril Shortness Of Breath       Objective:    BP (!) 147/84 (BP Location: Right Arm, Patient Position: Sitting, Cuff Size: Normal)   Pulse 66   Temp 98 F (36.7 C) (Oral)   Ht 4\' 10"  (1.473 m)   Wt 125 lb 12.8 oz (57.1 kg)   SpO2 97%   BMI 26.29 kg/m  Wt Readings from Last 3 Encounters:  05/21/17 125 lb 12.8 oz (57.1 kg)  02/08/17 121 lb 12.8 oz (55.2 kg)  10/19/16 121 lb (54.9 kg)    Physical Exam  Constitutional: She is oriented to person, place, and time. She appears well-developed and well-nourished. She is cooperative.  HENT:  Head: Normocephalic and atraumatic.  Eyes: EOM are normal.  Neck: Normal range of motion.  Cardiovascular: Normal rate, regular rhythm and normal heart sounds. Exam reveals no gallop and no friction rub.  No murmur heard. Pulmonary/Chest: Effort normal and breath sounds normal. No tachypnea. No respiratory distress.  She has no decreased breath sounds. She has no wheezes. She has no rhonchi. She has no rales. She exhibits no tenderness.  Abdominal: Soft. Bowel sounds are normal.  Musculoskeletal: Normal range of motion. She exhibits no edema.  Neurological: She is alert and oriented to person, place, and time. Coordination normal.  Skin: Skin is warm and dry.  Psychiatric: She has a normal mood and affect. Her behavior is normal. Judgment and thought content normal.  Nursing note and vitals reviewed.     Patient has been counseled extensively about nutrition and exercise as well as the importance of adherence with medications and regular follow-up. The patient was given clear instructions to go to ER or return to medical center if symptoms don't improve, worsen or new problems develop. The patient verbalized understanding.   Follow-up: Return in about 6 months (around 11/21/2017) for HPL/DM and fasting labs.   Gildardo Pounds, FNP-BC [facility] and [place],  [place]   05/27/2017, 9:51 PM

## 2017-05-21 NOTE — Patient Instructions (Addendum)
MELATONIN You can try this over the counter to help you sleep better at night.   OMEGA 3 FISH OIL You can take 2 capsules per day to help lower your cholesterol since you are not able to tolerate the pravastatin.

## 2017-05-27 ENCOUNTER — Encounter: Payer: Self-pay | Admitting: Nurse Practitioner

## 2017-06-23 ENCOUNTER — Ambulatory Visit
Admission: RE | Admit: 2017-06-23 | Discharge: 2017-06-23 | Disposition: A | Discharge status: Home / Self Care | Payer: Medicare Other | Source: Ambulatory Visit | Attending: Nurse Practitioner | Admitting: Nurse Practitioner

## 2017-06-23 DIAGNOSIS — Z1231 Encounter for screening mammogram for malignant neoplasm of breast: Secondary | ICD-10-CM

## 2017-09-13 ENCOUNTER — Encounter: Payer: Self-pay | Admitting: Allergy and Immunology

## 2017-09-13 ENCOUNTER — Ambulatory Visit (INDEPENDENT_AMBULATORY_CARE_PROVIDER_SITE_OTHER): Payer: Medicare Other | Admitting: Allergy and Immunology

## 2017-09-13 VITALS — BP 110/70 | HR 76 | Temp 98.1°F | Ht <= 58 in | Wt 126.8 lb

## 2017-09-13 DIAGNOSIS — J4540 Moderate persistent asthma, uncomplicated: Secondary | ICD-10-CM | POA: Diagnosis not present

## 2017-09-13 DIAGNOSIS — J3089 Other allergic rhinitis: Secondary | ICD-10-CM

## 2017-09-13 DIAGNOSIS — K219 Gastro-esophageal reflux disease without esophagitis: Secondary | ICD-10-CM

## 2017-09-13 MED ORDER — ALBUTEROL SULFATE HFA 108 (90 BASE) MCG/ACT IN AERS: 2.0000 | INHALATION_SPRAY | Freq: Four times a day (QID) | RESPIRATORY_TRACT | 3 refills | Status: DC | PRN

## 2017-09-13 MED ORDER — FLUTICASONE-SALMETEROL 100-50 MCG/DOSE IN AEPB: 1.0000 | INHALATION_SPRAY | Freq: Two times a day (BID) | RESPIRATORY_TRACT | 6 refills | Status: DC

## 2017-09-13 MED ORDER — RANITIDINE HCL 150 MG PO TABS: 150.0000 mg | ORAL_TABLET | Freq: Two times a day (BID) | ORAL | 3 refills | Status: DC | PRN

## 2017-09-13 MED ORDER — AZELASTINE HCL 0.1 % NA SOLN: 2.0000 | Freq: Two times a day (BID) | NASAL | 5 refills | Status: DC

## 2017-09-13 MED FILL — AZELASTINE HCL 137 MCG SPRY: 0.1 | 25 days supply | Qty: 30 | Fill #0

## 2017-09-13 MED FILL — FLUTICASONE-SALMETEROL 100-: 100-50 | 30 days supply | Qty: 60 | Fill #0

## 2017-09-13 MED FILL — raNITIdine HCL 150 MG TABS: 150 | 30 days supply | Qty: 60 | Fill #0

## 2017-09-13 MED FILL — PROAIR HFA 90 MCG INHALER: 108 (90 BAS | 25 days supply | Qty: 9 | Fill #0

## 2017-09-13 NOTE — Progress Notes (Signed)
Follow-up Note  RE: Tina [name] MRN: [id number] DOB: 09/01/1955 Date of Office Visit: 09/13/2017  Primary care provider: Alfonse Spruce, FNP Referring provider: Alfonse Spruce, F*  History of present illness: Tina [name] is a 62 y.o. female with persistent asthma and allergic rhinitis presenting today for follow-up.  She was last seen in this clinic on March 15, 2017.  She is accompanied today by her husband and an interpreter who assist with the history.  She is currently taking Advair 250-50 g twice daily and montelukast 10 mg daily, however despite compliance with these medications she is still experiencing some chest tightness, particularly during the nighttime.  Her nasal allergy symptoms are currently well controlled with montelukast daily and cetirizine as needed.  She has no nasal allergy symptom complaints today.  She has no reflux related complaints today.  Assessment and plan: Moderate persistent asthma  A sample and prescription of been provided for Advair 500/50 g, 1 inhalation twice daily.  For now, continue montelukast 10 mg daily at bedtime and albuterol every 6 hours if needed.  Subjective and objective measures of pulmonary function will be followed and the treatment plan will be adjusted accordingly.  Seasonal and perennial allergic rhinitis Stable.  Continue appropriate allergen avoidance measures, montelukast daily, and azelastine nasal spray as needed.  Nasal saline spray (i.e. Simply Saline) is recommended prior to medicated nasal sprays and as needed.  Gastroesophageal reflux disease  Continue appropriate reflux lifestyle modifications and ranitidine 150 mg twice daily if needed.   Meds ordered this encounter  Medications  . azelastine (ASTELIN) 0.1 % nasal spray    Sig: Place 2 sprays into both nostrils 2 (two) times daily.    Dispense:  30 mL    Refill:  5  . albuterol (PROVENTIL HFA;VENTOLIN HFA) 108 (90 Base) MCG/ACT inhaler    Sig:  Inhale 2 puffs into the lungs every 6 (six) hours as needed for wheezing or shortness of breath.    Dispense:  1 Inhaler    Refill:  3  . Fluticasone-Salmeterol (ADVAIR) 100-50 MCG/DOSE AEPB    Sig: Inhale 1 puff into the lungs 2 (two) times daily.    Dispense:  60 each    Refill:  6  . ranitidine (ZANTAC) 150 MG tablet    Sig: Take 1 tablet (150 mg total) by mouth 2 (two) times daily as needed for heartburn.    Dispense:  60 tablet    Refill:  3    Diagnostics: Spirometry revealed an FVC of 2.92 L and an FEV1 of 2.80 L, however she had difficulty with technique today, making the results uninterpretable.  Please see scanned spirometry results for details.    Physical examination: Blood pressure 110/70, pulse 76, temperature 98.1 F (36.7 C), temperature source Oral, height 4\' 10"  (1.473 m), weight 126 lb 12.8 oz (57.5 kg), SpO2 96 %.  General: Alert, interactive, in no acute distress. HEENT: TMs pearly gray, turbinates mildly edematous without discharge, post-pharynx unremarkable. Neck: Supple without lymphadenopathy. Lungs: Clear to auscultation without wheezing, rhonchi or rales. CV: Normal S1, S2 without murmurs. Skin: Warm and dry, without lesions or rashes.  The following portions of the patient's history were reviewed and updated as appropriate: allergies, current medications, past family history, past medical history, past social history, past surgical history and problem list.  Allergies as of 09/13/2017      Reactions   Hctz [hydrochlorothiazide] Shortness Of Breath   Lisinopril Shortness Of Breath  Medication List        Accurate as of 09/13/17 11:59 AM. Always use your most recent med list.          albuterol 108 (90 Base) MCG/ACT inhaler Commonly known as:  PROVENTIL HFA;VENTOLIN HFA Inhale 2 puffs into the lungs every 6 (six) hours as needed for wheezing or shortness of breath.   azelastine 0.1 % nasal spray Commonly known as:  ASTELIN Place 2 sprays  into both nostrils 2 (two) times daily.   cetirizine 10 MG tablet Commonly known as:  ZYRTEC Take 1 tablet (10 mg total) by mouth daily.   Fluticasone-Salmeterol 100-50 MCG/DOSE Aepb Commonly known as:  ADVAIR Inhale 1 puff into the lungs 2 (two) times daily.   Melatonin 5 MG Caps Take 1 capsule by mouth daily.   montelukast 10 MG tablet Commonly known as:  SINGULAIR Take 1 tablet (10 mg total) by mouth at bedtime.   ranitidine 150 MG tablet Commonly known as:  ZANTAC Take 1 tablet (150 mg total) by mouth 2 (two) times daily as needed for heartburn.       Allergies  Allergen Reactions  . Hctz [Hydrochlorothiazide] Shortness Of Breath  . Lisinopril Shortness Of Breath   Review of systems: Review of systems negative except as noted in HPI / PMHx or noted below: Constitutional: Negative.  HENT: Negative.   Eyes: Negative.  Respiratory: Negative.   Cardiovascular: Negative.  Gastrointestinal: Negative.  Genitourinary: Negative.  Musculoskeletal: Negative.  Neurological: Negative.  Endo/Heme/Allergies: Negative.  Cutaneous: Negative.  Past Medical History:  Diagnosis Date  . Asthma   . Nephrolithiasis    "came out on it's own" (12/21/2012)  . Prediabetes     Family History  Problem Relation Age of Onset  . Tuberculosis Mother   . Asthma Mother   . Asthma Father   . Tuberculosis Father     Social History   Socioeconomic History  . Marital status: Married    Spouse name: Not on file  . Number of children: Not on file  . Years of education: Not on file  . Highest education level: Not on file  Occupational History  . Not on file  Social Needs  . Financial resource strain: Not on file  . Food insecurity:    Worry: Not on file    Inability: Not on file  . Transportation needs:    Medical: Not on file    Non-medical: Not on file  Tobacco Use  . Smoking status: Never Smoker  . Smokeless tobacco: Never Used  Substance and Sexual Activity  . Alcohol  use: No  . Drug use: No  . Sexual activity: Yes  Lifestyle  . Physical activity:    Days per week: Not on file    Minutes per session: Not on file  . Stress: Not on file  Relationships  . Social connections:    Talks on phone: Not on file    Gets together: Not on file    Attends religious service: Not on file    Active member of club or organization: Not on file    Attends meetings of clubs or organizations: Not on file    Relationship status: Not on file  . Intimate partner violence:    Fear of current or ex partner: Not on file    Emotionally abused: Not on file    Physically abused: Not on file    Forced sexual activity: Not on file  Other Topics Concern  . Not  on file  Social History Narrative  . Not on file    I appreciate the opportunity to take part in Jennise's care. Please do not hesitate to contact me with questions.  Sincerely,   R. Edgar Frisk, MD

## 2017-09-13 NOTE — Assessment & Plan Note (Signed)
Stable.  Continue appropriate allergen avoidance measures, montelukast daily, and azelastine nasal spray as needed.  Nasal saline spray (i.e. Simply Saline) is recommended prior to medicated nasal sprays and as needed.

## 2017-09-13 NOTE — Assessment & Plan Note (Signed)
   A sample and prescription of been provided for Advair 500/50 g, 1 inhalation twice daily.  For now, continue montelukast 10 mg daily at bedtime and albuterol every 6 hours if needed.  Subjective and objective measures of pulmonary function will be followed and the treatment plan will be adjusted accordingly.

## 2017-09-13 NOTE — Assessment & Plan Note (Signed)
   Continue appropriate reflux lifestyle modifications and ranitidine 150 mg twice daily if needed.

## 2017-09-13 NOTE — Patient Instructions (Addendum)
Moderate persistent asthma  A sample and prescription of been provided for Advair 500/50 g, 1 inhalation twice daily.  For now, continue montelukast 10 mg daily at bedtime and albuterol every 6 hours if needed.  Subjective and objective measures of pulmonary function will be followed and the treatment plan will be adjusted accordingly.  Seasonal and perennial allergic rhinitis Stable.  Continue appropriate allergen avoidance measures, montelukast daily, and azelastine nasal spray as needed.  Nasal saline spray (i.e. Simply Saline) is recommended prior to medicated nasal sprays and as needed.  Gastroesophageal reflux disease  Continue appropriate reflux lifestyle modifications and ranitidine 150 mg twice daily if needed.   Return in about 5-6 months, or if symptoms worsen or fail to improve.

## 2017-10-26 MED FILL — raNITIdine HCL 150 MG TABS: 150 | 30 days supply | Qty: 60 | Fill #1

## 2017-10-26 MED FILL — AZELASTINE HCL 137 MCG SPRY: 0.1 | 25 days supply | Qty: 30 | Fill #1

## 2017-10-26 MED FILL — FLUTICASONE-SALMETEROL 100-: 100-50 | 30 days supply | Qty: 60 | Fill #1

## 2017-10-26 MED FILL — MONTELUKAST SOD 10 MG TAB: 10 | 90 days supply | Qty: 90 | Fill #1

## 2017-10-26 MED FILL — PROAIR HFA 90 MCG INHALER: 108 (90 BAS | 25 days supply | Qty: 9 | Fill #1

## 2017-12-20 ENCOUNTER — Ambulatory Visit: Payer: Medicare Other | Attending: Nurse Practitioner | Admitting: Nurse Practitioner

## 2017-12-20 MED FILL — PROAIR HFA 90 MCG INHALER: 108 (90 BAS | 25 days supply | Qty: 9 | Fill #2

## 2017-12-20 MED FILL — FLUTICASONE-SALMETEROL 100-: 100-50 | 30 days supply | Qty: 60 | Fill #2 | Status: TO

## 2017-12-20 NOTE — Progress Notes (Signed)
Pt and husband did not bring interpreter.  Husband could speak very  little english.   Provider Vella Kohler will have them schedule another visit.

## 2017-12-20 NOTE — Progress Notes (Signed)
This encounter was created in error - please disregard.

## 2018-01-11 ENCOUNTER — Encounter: Payer: Self-pay | Admitting: Nurse Practitioner

## 2018-01-11 ENCOUNTER — Ambulatory Visit: Payer: Medicare Other | Attending: Nurse Practitioner | Admitting: Nurse Practitioner

## 2018-01-11 VITALS — BP 143/81 | HR 72 | Temp 98.3°F | Ht <= 58 in | Wt 125.0 lb

## 2018-01-11 DIAGNOSIS — R7303 Prediabetes: Secondary | ICD-10-CM | POA: Diagnosis not present

## 2018-01-11 DIAGNOSIS — G8929 Other chronic pain: Secondary | ICD-10-CM

## 2018-01-11 DIAGNOSIS — E782 Mixed hyperlipidemia: Secondary | ICD-10-CM | POA: Diagnosis not present

## 2018-01-11 DIAGNOSIS — J4540 Moderate persistent asthma, uncomplicated: Secondary | ICD-10-CM | POA: Diagnosis not present

## 2018-01-11 DIAGNOSIS — Z888 Allergy status to other drugs, medicaments and biological substances status: Secondary | ICD-10-CM | POA: Diagnosis not present

## 2018-01-11 DIAGNOSIS — M25561 Pain in right knee: Secondary | ICD-10-CM | POA: Diagnosis not present

## 2018-01-11 DIAGNOSIS — Z79899 Other long term (current) drug therapy: Secondary | ICD-10-CM | POA: Insufficient documentation

## 2018-01-11 DIAGNOSIS — Z0001 Encounter for general adult medical examination with abnormal findings: Secondary | ICD-10-CM | POA: Diagnosis not present

## 2018-01-11 DIAGNOSIS — Z0000 Encounter for general adult medical examination without abnormal findings: Secondary | ICD-10-CM

## 2018-01-11 DIAGNOSIS — Z9049 Acquired absence of other specified parts of digestive tract: Secondary | ICD-10-CM | POA: Diagnosis not present

## 2018-01-11 DIAGNOSIS — Z01419 Encounter for gynecological examination (general) (routine) without abnormal findings: Secondary | ICD-10-CM | POA: Insufficient documentation

## 2018-01-11 LAB — POCT GLYCOSYLATED HEMOGLOBIN (HGB A1C): Hemoglobin A1C: 5.9 % — AB (ref 4.0–5.6)

## 2018-01-11 LAB — GLUCOSE, POCT (MANUAL RESULT ENTRY): POC Glucose: 100 mg/dl — AB (ref 70–99)

## 2018-01-11 MED ORDER — DICLOFENAC SODIUM 75 MG PO TBEC: 75.0000 mg | DELAYED_RELEASE_TABLET | Freq: Two times a day (BID) | ORAL | 1 refills | Status: DC | PRN

## 2018-01-11 MED ORDER — MONTELUKAST SODIUM 10 MG PO TABS: 10.0000 mg | ORAL_TABLET | Freq: Every day | ORAL | 3 refills | Status: DC

## 2018-01-11 NOTE — Progress Notes (Signed)
Assessment & Plan:  Tina [name] was seen today for annual exam.  Diagnoses and all orders for this visit:  Well woman exam without gynecological exam -     CBC -     CMP14+EGFR -     Lipid panel  Chronic pain of right knee -     diclofenac (VOLTAREN) 75 MG EC tablet; Take 1 tablet (75 mg total) by mouth 2 (two) times daily as needed for moderate pain. Work on losing weight to help reduce joint pain. May alternate with heat and ice application for pain relief. May also alternate with acetaminophenas prescribed pain relief. Other alternatives include massage, acupuncture and water aerobics.  You must stay active and avoid a sedentary lifestyle.  Prediabetes -     Glucose (CBG) -     HgB A1c A1c increased. Discussed dietary and exercise modifications.  Continue blood sugar control as discussed in office today, low carbohydrate diet, and regular physical exercise as tolerated, 150 minutes per week (30 min each day, 5 days per week, or 50 min 3 days per week).   Moderate persistent asthma without complication -     montelukast (SINGULAIR) 10 MG tablet; Take 1 tablet (10 mg total) by mouth at bedtime.    Patient has been counseled on age-appropriate routine health concerns for screening and prevention. These are reviewed and up-to-date. Referrals have been placed accordingly. Immunizations are up-to-date or declined.    Subjective:   Chief Complaint  Patient presents with  . Annual Exam    Pt. is here for a physical.    HPI Tina [name] 62 y.o. female presents to office today for well woman exam. She is not due for pap smear this year. Next PAP due 05-2018.She has an onsite interpreter here with her today. I did have the interpreter leave the room during her physical however I instructed him to ascertain if she had any questions prior to him leaving and me initiating the physical.  She has complaints of chronic right knee and hip pain. Taking OTC tylenol with no relief of pain. Pain is  described as aching and intermittent. No aggravating factors: pain comes and goes on its own. No relieving factors. She denies any swelling,  injury or trauma to her RLE.   Review of Systems  Constitutional: Negative.  Negative for chills, fever, malaise/fatigue and weight loss.  HENT: Negative.  Negative for congestion, hearing loss, sinus pain and sore throat.   Eyes: Negative.  Negative for blurred vision, double vision, photophobia and pain.  Respiratory: Negative.  Negative for cough, sputum production, shortness of breath (chronic asthma) and wheezing.   Cardiovascular: Negative.  Negative for chest pain and leg swelling.  Gastrointestinal: Negative.  Negative for abdominal pain, constipation, diarrhea, heartburn, nausea and vomiting.  Genitourinary: Negative.   Musculoskeletal: Positive for joint pain (SEE HPI). Negative for myalgias.  Skin: Negative.  Negative for rash.  Neurological: Negative.  Negative for dizziness, tremors, speech change, focal weakness, seizures and headaches.  Endo/Heme/Allergies: Negative.  Negative for environmental allergies.  Psychiatric/Behavioral: Negative.  Negative for depression and suicidal ideas. The patient is not nervous/anxious and does not have insomnia.     Past Medical History:  Diagnosis Date  . Asthma   . Nephrolithiasis    "came out on it's own" (12/21/2012)  . Prediabetes     Past Surgical History:  Procedure Laterality Date  . CHOLECYSTECTOMY N/A 12/21/2012   Procedure: LAPAROSCOPIC CHOLECYSTECTOMY;  Surgeon: Ralene Ok, MD;  Location: [place];  Service: General;  Laterality: N/A;  . KIDNEY STONE SURGERY     spouse denies this hx on 12/21/2012  . LAPAROSCOPIC CHOLECYSTECTOMY  12/21/2012    Family History  Problem Relation Age of Onset  . Tuberculosis Mother   . Asthma Mother   . Asthma Father   . Tuberculosis Father     Social History Reviewed with no changes to be made today.   Outpatient Medications Prior to Visit    Medication Sig Dispense Refill  . albuterol (PROVENTIL HFA;VENTOLIN HFA) 108 (90 Base) MCG/ACT inhaler Inhale 2 puffs into the lungs every 6 (six) hours as needed for wheezing or shortness of breath. 1 Inhaler 3  . Fluticasone-Salmeterol (ADVAIR) 100-50 MCG/DOSE AEPB Inhale 1 puff into the lungs 2 (two) times daily. 60 each 6  . omega-3 acid ethyl esters (LOVAZA) 1 g capsule Take by mouth 2 (two) times daily.    . montelukast (SINGULAIR) 10 MG tablet Take 1 tablet (10 mg total) by mouth at bedtime. 90 tablet 3  . azelastine (ASTELIN) 0.1 % nasal spray Place 2 sprays into both nostrils 2 (two) times daily. (Patient not taking: Reported on 12/20/2017) 30 mL 5  . cetirizine (ZYRTEC) 10 MG tablet Take 1 tablet (10 mg total) by mouth daily. (Patient not taking: Reported on 01/11/2018) 30 tablet 11  . Melatonin 5 MG CAPS Take 1 capsule by mouth daily.    . ranitidine (ZANTAC) 150 MG tablet Take 1 tablet (150 mg total) by mouth 2 (two) times daily as needed for heartburn. (Patient not taking: Reported on 01/11/2018) 60 tablet 3   No facility-administered medications prior to visit.     Allergies  Allergen Reactions  . Hctz [Hydrochlorothiazide] Shortness Of Breath  . Lisinopril Shortness Of Breath       Objective:    BP (!) 143/81 (BP Location: Left Arm, Patient Position: Sitting, Cuff Size: Normal)   Pulse 72   Temp 98.3 F (36.8 C) (Oral)   Ht _0  (1.473 m)   Wt 125 lb (56.7 kg)   SpO2 94%   BMI 26.13 kg/m  Wt Readings from Last 3 Encounters:  01/11/18 125 lb (56.7 kg)  12/20/17 125 lb 3.2 oz (56.8 kg)  09/13/17 126 lb 12.8 oz (57.5 kg)    Physical Exam  Constitutional: She is oriented to person, place, and time. She appears well-developed and well-nourished. No distress.  HENT:  Head: Normocephalic and atraumatic.  Right Ear: External ear normal.  Left Ear: External ear normal.  Nose: Nose normal.  Mouth/Throat: Oropharynx is clear and moist. No oropharyngeal exudate.   Eyes: Pupils are equal, round, and reactive to light. Conjunctivae and EOM are normal. Right eye exhibits no discharge. Left eye exhibits no discharge. No scleral icterus.  Neck: Normal range of motion. Neck supple. No tracheal deviation present. No thyromegaly present.  Cardiovascular: Normal rate, regular rhythm, normal heart sounds and intact distal pulses. Exam reveals no friction rub.  No murmur heard. Pulmonary/Chest: Effort normal and breath sounds normal. No respiratory distress. She has no decreased breath sounds. She has no wheezes. She has no rhonchi. She has no rales. She exhibits no tenderness. Right breast exhibits no inverted nipple, no mass, no nipple discharge, no skin change and no tenderness. Left breast exhibits no inverted nipple, no mass, no nipple discharge, no skin change and no tenderness.  Abdominal: Soft. Bowel sounds are normal. She exhibits no distension and no mass. There is no tenderness. There is no rebound and no  guarding. Hernia confirmed negative in the right inguinal area and confirmed negative in the left inguinal area.  Genitourinary: Vagina normal. Rectal exam shows no external hemorrhoid, no fissure, no mass and anal tone normal. No labial fusion. There is no rash, tenderness, lesion or injury on the right labia. There is no rash, tenderness, lesion or injury on the left labia. Uterus is not tender. Cervix exhibits no motion tenderness, no discharge and no friability. Right adnexum displays no mass, no tenderness and no fullness. Left adnexum displays no mass, no tenderness and no fullness. No erythema, tenderness or bleeding in the vagina. No vaginal discharge found.  Musculoskeletal: Normal range of motion. She exhibits no edema or deformity.       Right hip: She exhibits normal range of motion and no tenderness.       Right knee: She exhibits normal range of motion and no swelling. Tenderness found. Patellar tendon tenderness noted.  Lymphadenopathy:    She has  no cervical adenopathy.  Neurological: She is alert and oriented to person, place, and time. No cranial nerve deficit. Coordination normal.  Skin: Skin is warm and dry. No rash noted. She is not diaphoretic. No erythema. No pallor.  Psychiatric: She has a normal mood and affect. Her behavior is normal. Judgment and thought content normal.       Patient has been counseled extensively about nutrition and exercise as well as the importance of adherence with medications and regular follow-up. The patient was given clear instructions to go to ER or return to medical center if symptoms don't improve, worsen or new problems develop. The patient verbalized understanding.   Follow-up: Return in about 6 months (around 07/12/2018).   Gildardo Pounds, FNP-BC [facility] and [facility] [place], [place]   01/11/2018, 1:34 PM

## 2018-01-12 ENCOUNTER — Other Ambulatory Visit: Payer: Self-pay | Admitting: Nurse Practitioner

## 2018-01-12 LAB — CMP14+EGFR
ALT: 33 IU/L — ABNORMAL HIGH (ref 0–32)
AST: 25 IU/L (ref 0–40)
Albumin/Globulin Ratio: 1.4 (ref 1.2–2.2)
Albumin: 4.6 g/dL (ref 3.6–4.8)
Alkaline Phosphatase: 64 IU/L (ref 39–117)
BUN/Creatinine Ratio: 15 (ref 12–28)
BUN: 13 mg/dL (ref 8–27)
Bilirubin Total: 0.5 mg/dL (ref 0.0–1.2)
CALCIUM: 9.8 mg/dL (ref 8.7–10.3)
CHLORIDE: 102 mmol/L (ref 96–106)
CO2: 23 mmol/L (ref 20–29)
Creatinine, Ser: 0.84 mg/dL (ref 0.57–1.00)
GFR, EST AFRICAN AMERICAN: 86 mL/min/{1.73_m2} (ref >59)
GFR, EST NON AFRICAN AMERICAN: 74 mL/min/{1.73_m2} (ref >59)
GLOBULIN, TOTAL: 3.2 g/dL (ref 1.5–4.5)
Glucose: 105 mg/dL — ABNORMAL HIGH (ref 65–99)
POTASSIUM: 4 mmol/L (ref 3.5–5.2)
SODIUM: 141 mmol/L (ref 134–144)
TOTAL PROTEIN: 7.8 g/dL (ref 6.0–8.5)

## 2018-01-12 LAB — LIPID PANEL
CHOL/HDL RATIO: 5.3 ratio — AB (ref 0.0–4.4)
Cholesterol, Total: 227 mg/dL — ABNORMAL HIGH (ref 100–199)
HDL: 43 mg/dL (ref >39)
LDL Calculated: 138 mg/dL — ABNORMAL HIGH (ref 0–99)
Triglycerides: 229 mg/dL — ABNORMAL HIGH (ref 0–149)
VLDL Cholesterol Cal: 46 mg/dL — ABNORMAL HIGH (ref 5–40)

## 2018-01-12 LAB — CBC
HEMATOCRIT: 41.7 % (ref 34.0–46.6)
HEMOGLOBIN: 14.2 g/dL (ref 11.1–15.9)
MCH: 25.5 pg — ABNORMAL LOW (ref 26.6–33.0)
MCHC: 34.1 g/dL (ref 31.5–35.7)
MCV: 75 fL — ABNORMAL LOW (ref 79–97)
Platelets: 221 10*3/uL (ref 150–450)
RBC: 5.56 x10E6/uL — AB (ref 3.77–5.28)
RDW: 14.1 % (ref 12.3–15.4)
WBC: 7 10*3/uL (ref 3.4–10.8)

## 2018-01-12 MED ORDER — ATORVASTATIN CALCIUM 20 MG PO TABS: 20.0000 mg | ORAL_TABLET | Freq: Every day | ORAL | 3 refills | Status: DC

## 2018-01-17 ENCOUNTER — Telehealth: Payer: Self-pay

## 2018-01-17 NOTE — Telephone Encounter (Signed)
-----   Message from Gildardo Pounds, NP sent at 01/12/2018  9:06 PM EST ----- Labs are essentially normal except for your cholesterol levels which are extremely elevated. I will be sending in a cholesterol medication for you to take everyday.

## 2018-01-17 NOTE — Telephone Encounter (Signed)
CMA attempt to reach patient.   No answer and left a VM for patient.  A letter will be send out to reach patient.

## 2018-02-14 ENCOUNTER — Ambulatory Visit (INDEPENDENT_AMBULATORY_CARE_PROVIDER_SITE_OTHER): Payer: Medicare Other | Admitting: Allergy and Immunology

## 2018-02-14 ENCOUNTER — Encounter: Payer: Self-pay | Admitting: Allergy and Immunology

## 2018-02-14 VITALS — BP 110/62 | HR 68 | Resp 16 | Ht <= 58 in | Wt 131.4 lb

## 2018-02-14 DIAGNOSIS — K219 Gastro-esophageal reflux disease without esophagitis: Secondary | ICD-10-CM | POA: Diagnosis not present

## 2018-02-14 DIAGNOSIS — J4540 Moderate persistent asthma, uncomplicated: Secondary | ICD-10-CM | POA: Diagnosis not present

## 2018-02-14 DIAGNOSIS — J3089 Other allergic rhinitis: Secondary | ICD-10-CM | POA: Diagnosis not present

## 2018-02-14 MED ORDER — ALBUTEROL SULFATE HFA 108 (90 BASE) MCG/ACT IN AERS: 2.0000 | INHALATION_SPRAY | Freq: Four times a day (QID) | RESPIRATORY_TRACT | 3 refills | Status: DC | PRN

## 2018-02-14 MED ORDER — MONTELUKAST SODIUM 10 MG PO TABS: 10.0000 mg | ORAL_TABLET | Freq: Every day | ORAL | 3 refills | Status: DC

## 2018-02-14 MED ORDER — ALBUTEROL SULFATE HFA 108 (90 BASE) MCG/ACT IN AERS: INHALATION_SPRAY | RESPIRATORY_TRACT | 1 refills | Status: DC

## 2018-02-14 MED ORDER — FLUTICASONE-SALMETEROL 100-50 MCG/DOSE IN AEPB: 1.0000 | INHALATION_SPRAY | Freq: Two times a day (BID) | RESPIRATORY_TRACT | 6 refills | Status: DC

## 2018-02-14 MED ORDER — FAMOTIDINE 20 MG PO TABS: 20.0000 mg | ORAL_TABLET | Freq: Two times a day (BID) | ORAL | 5 refills | Status: DC

## 2018-02-14 NOTE — Patient Instructions (Addendum)
Moderate persistent asthma Stable.  Continue Advair 500/50 g, 1 inhalation twice daily, montelukast 10 mg daily at bedtime, and albuterol HFA, 1 to 2 inhalations every 4-6 hours if needed.  Subjective and objective measures of pulmonary function will be followed and the treatment plan will be adjusted accordingly.  Seasonal and perennial allergic rhinitis Stable.  Medicated nasal sprays will not be used due to perceived side effects.  Continue appropriate allergen avoidance measures and montelukast daily.  Nasal saline spray (i.e. Simply Saline) is recommended as needed if tolerated.  Gastroesophageal reflux disease  Continue appropriate reflux lifestyle modifications   Discontinue ranitidine.    Take famotidine (Pepcid) 20 mg twice daily if needed.    Return in about 5 months (around 07/16/2018), or if symptoms worsen or fail to improve.

## 2018-02-14 NOTE — Assessment & Plan Note (Signed)
Stable.  Continue Advair 500/50 g, 1 inhalation twice daily, montelukast 10 mg daily at bedtime, and albuterol HFA, 1 to 2 inhalations every 4-6 hours if needed.  Subjective and objective measures of pulmonary function will be followed and the treatment plan will be adjusted accordingly.

## 2018-02-14 NOTE — Addendum Note (Signed)
Addended by: Orlene Erm on: 02/14/2018 02:29 PM   Modules accepted: Orders

## 2018-02-14 NOTE — Progress Notes (Signed)
Follow-up Note  RE: Tina [name] MRN: [id number] DOB: [birth year] Date of Office Visit: 02/14/2018  Primary care provider: Gildardo Pounds, NP Referring provider: Alfonse Spruce, F*  History of present illness: Tina [name] is a 62 y.o. female with persistent asthma and allergic rhinitis presenting today for follow-up.  She was last seen in this clinic on September 13, 2017.  She is accompanied today by an interpreter who assists with the history.  She reports that in the interval since her previous visit her asthma has been well controlled while taking Advair 500/50 g, 1 inhalation twice daily, and montelukast 10 mg daily at bedtime.  She requires albuterol rescue 2 times per week on average.  She experiences nocturnal awakenings from asthma 1 or 2 times per month on average. She reports that she is unable to use medicated nasal sprays because they seem to cause sneezing fits.  Her nasal allergy symptoms have been well controlled with montelukast daily at bedtime.  She currently takes ranitidine as needed for acid reflux.  Assessment and plan: Moderate persistent asthma Stable.  Continue Advair 500/50 g, 1 inhalation twice daily, montelukast 10 mg daily at bedtime, and albuterol HFA, 1 to 2 inhalations every 4-6 hours if needed.  Subjective and objective measures of pulmonary function will be followed and the treatment plan will be adjusted accordingly.  Seasonal and perennial allergic rhinitis Stable.  Medicated nasal sprays will not be used due to perceived side effects.  Continue appropriate allergen avoidance measures and montelukast daily.  Nasal saline spray (i.e. Simply Saline) is recommended as needed if tolerated.  Gastroesophageal reflux disease  Continue appropriate reflux lifestyle modifications   Discontinue ranitidine.    Take famotidine (Pepcid) 20 mg twice daily if needed.    Meds ordered this encounter  Medications  . albuterol (PROVENTIL HFA;VENTOLIN HFA)  108 (90 Base) MCG/ACT inhaler    Sig: Inhale 2 puffs into the lungs every 6 (six) hours as needed for wheezing or shortness of breath.    Dispense:  1 Inhaler    Refill:  3  . Fluticasone-Salmeterol (ADVAIR) 100-50 MCG/DOSE AEPB    Sig: Inhale 1 puff into the lungs 2 (two) times daily.    Dispense:  60 each    Refill:  6  . montelukast (SINGULAIR) 10 MG tablet    Sig: Take 1 tablet (10 mg total) by mouth at bedtime.    Dispense:  90 tablet    Refill:  3  . famotidine (PEPCID) 20 MG tablet    Sig: Take 1 tablet (20 mg total) by mouth 2 (two) times daily.    Dispense:  60 tablet    Refill:  5    Diagnostics: Spirometry:  Normal with an FEV1 of 84% predicted.  Please see scanned spirometry results for details.    Physical examination: Blood pressure 110/62, pulse 68, resp. rate 16, height 4\' 10"  (1.473 m), weight 131 lb 6.4 oz (59.6 kg), SpO2 97 %.  General: Alert, interactive, in no acute distress. HEENT: TMs pearly gray, turbinates mildly edematous without discharge, post-pharynx unremarkable. Neck: Supple without lymphadenopathy. Lungs: Clear to auscultation without wheezing, rhonchi or rales. CV: Normal S1, S2 without murmurs. Skin: Warm and dry, without lesions or rashes.  The following portions of the patient's history were reviewed and updated as appropriate: allergies, current medications, past family history, past medical history, past social history, past surgical history and problem list.  Allergies as of 02/14/2018      Reactions  Hctz [hydrochlorothiazide] Shortness Of Breath   Lisinopril Shortness Of Breath      Medication List        Accurate as of 02/14/18 10:35 AM. Always use your most recent med list.          albuterol 108 (90 Base) MCG/ACT inhaler Commonly known as:  PROVENTIL HFA;VENTOLIN HFA Inhale 2 puffs into the lungs every 6 (six) hours as needed for wheezing or shortness of breath.   atorvastatin 20 MG tablet Commonly known as:   LIPITOR Take 1 tablet (20 mg total) by mouth daily.   diclofenac 75 MG EC tablet Commonly known as:  VOLTAREN Take 1 tablet (75 mg total) by mouth 2 (two) times daily as needed for moderate pain.   famotidine 20 MG tablet Commonly known as:  PEPCID Take 1 tablet (20 mg total) by mouth 2 (two) times daily.   Fluticasone-Salmeterol 100-50 MCG/DOSE Aepb Commonly known as:  ADVAIR Inhale 1 puff into the lungs 2 (two) times daily.   Melatonin 5 MG Caps Take 1 capsule by mouth daily.   montelukast 10 MG tablet Commonly known as:  SINGULAIR Take 1 tablet (10 mg total) by mouth at bedtime.   omega-3 acid ethyl esters 1 g capsule Commonly known as:  LOVAZA Take by mouth 2 (two) times daily.   VITAMIN D3 COMPLETE PO Take 1,000 Units by mouth daily.       Allergies  Allergen Reactions  . Hctz [Hydrochlorothiazide] Shortness Of Breath  . Lisinopril Shortness Of Breath   Review of systems: Review of systems negative except as noted in HPI / PMHx or noted below: Constitutional: Negative.  HENT: Negative.   Eyes: Negative.  Respiratory: Negative.   Cardiovascular: Negative.  Gastrointestinal: Negative.  Genitourinary: Negative.  Musculoskeletal: Negative.  Neurological: Negative.  Endo/Heme/Allergies: Negative.  Cutaneous: Negative.  Past Medical History:  Diagnosis Date  . Asthma   . Nephrolithiasis    "came out on it's own" (12/21/2012)  . Prediabetes     Family History  Problem Relation Age of Onset  . Tuberculosis Mother   . Asthma Mother   . Asthma Father   . Tuberculosis Father     Social History   Socioeconomic History  . Marital status: Married    Spouse name: Not on file  . Number of children: Not on file  . Years of education: Not on file  . Highest education level: Not on file  Occupational History  . Not on file  Social Needs  . Financial resource strain: Not on file  . Food insecurity:    Worry: Not on file    Inability: Not on file  .  Transportation needs:    Medical: Not on file    Non-medical: Not on file  Tobacco Use  . Smoking status: Never Smoker  . Smokeless tobacco: Never Used  Substance and Sexual Activity  . Alcohol use: No  . Drug use: No  . Sexual activity: Yes  Lifestyle  . Physical activity:    Days per week: Not on file    Minutes per session: Not on file  . Stress: Not on file  Relationships  . Social connections:    Talks on phone: Not on file    Gets together: Not on file    Attends religious service: Not on file    Active member of club or organization: Not on file    Attends meetings of clubs or organizations: Not on file    Relationship status:  Not on file  . Intimate partner violence:    Fear of current or ex partner: Not on file    Emotionally abused: Not on file    Physically abused: Not on file    Forced sexual activity: Not on file  Other Topics Concern  . Not on file  Social History Narrative  . Not on file    I appreciate the opportunity to take part in Magdalynn's care. Please do not hesitate to contact me with questions.  Sincerely,   R. Edgar Frisk, MD

## 2018-02-14 NOTE — Assessment & Plan Note (Signed)
   Continue appropriate reflux lifestyle modifications   Discontinue ranitidine.    Take famotidine (Pepcid) 20 mg twice daily if needed.

## 2018-02-14 NOTE — Assessment & Plan Note (Signed)
Stable.  Medicated nasal sprays will not be used due to perceived side effects.  Continue appropriate allergen avoidance measures and montelukast daily.  Nasal saline spray (i.e. Simply Saline) is recommended as needed if tolerated. 

## 2018-05-19 ENCOUNTER — Emergency Department (HOSPITAL_COMMUNITY): Payer: Medicare Other

## 2018-05-19 ENCOUNTER — Other Ambulatory Visit: Payer: Self-pay

## 2018-05-19 ENCOUNTER — Encounter (HOSPITAL_COMMUNITY): Payer: Self-pay | Admitting: Emergency Medicine

## 2018-05-19 ENCOUNTER — Emergency Department (HOSPITAL_COMMUNITY)
Admission: EM | Admit: 2018-05-19 | Discharge: 2018-05-19 | Disposition: A | Discharge status: Home / Self Care | Payer: Medicare Other | Attending: Emergency Medicine | Admitting: Emergency Medicine

## 2018-05-19 DIAGNOSIS — R509 Fever, unspecified: Secondary | ICD-10-CM | POA: Diagnosis not present

## 2018-05-19 DIAGNOSIS — R05 Cough: Secondary | ICD-10-CM

## 2018-05-19 DIAGNOSIS — R079 Chest pain, unspecified: Secondary | ICD-10-CM | POA: Diagnosis not present

## 2018-05-19 DIAGNOSIS — I4510 Unspecified right bundle-branch block: Secondary | ICD-10-CM | POA: Diagnosis not present

## 2018-05-19 DIAGNOSIS — B9789 Other viral agents as the cause of diseases classified elsewhere: Secondary | ICD-10-CM | POA: Diagnosis not present

## 2018-05-19 DIAGNOSIS — R001 Bradycardia, unspecified: Secondary | ICD-10-CM | POA: Diagnosis not present

## 2018-05-19 DIAGNOSIS — J069 Acute upper respiratory infection, unspecified: Secondary | ICD-10-CM | POA: Insufficient documentation

## 2018-05-19 DIAGNOSIS — R059 Cough, unspecified: Secondary | ICD-10-CM

## 2018-05-19 MED ORDER — NAPROXEN 250 MG PO TABS: 250.0000 mg | ORAL_TABLET | Freq: Two times a day (BID) | ORAL | 0 refills | Status: DC

## 2018-05-19 MED ORDER — BENZONATATE 100 MG PO CAPS: 100.0000 mg | ORAL_CAPSULE | Freq: Three times a day (TID) | ORAL | 0 refills | Status: DC

## 2018-05-19 NOTE — ED Provider Notes (Signed)
[facility] Emergency Department Provider Note MRN:  [id number]  Arrival date & time: 05/19/18     Chief Complaint   Cough   History of Present Illness   Hgnot Dalsanto is a 63 y.o. year-old female with no pertinent past medical history presenting to the ED with chief complaint of cough.  History obtained with video interpreter, [place].  3 days of cough, nasal congestion, intermittent subjective fever.  Occasional mild shortness of breath, occasional chest soreness when coughing.  Mild dull frontal headache, no vision change, no vomiting, no abdominal pain, no numbness weakness to the arms or legs.  Review of Systems  A complete 10 system review of systems was obtained and all systems are negative except as noted in the HPI and PMH.   Patient's Health History   History reviewed. No pertinent past medical history.  History reviewed. No pertinent surgical history.  No family history on file.  Social History   Socioeconomic History  . Marital status: Married    Spouse name: Not on file  . Number of children: Not on file  . Years of education: Not on file  . Highest education level: Not on file  Occupational History  . Not on file  Social Needs  . Financial resource strain: Not on file  . Food insecurity:    Worry: Not on file    Inability: Not on file  . Transportation needs:    Medical: Not on file    Non-medical: Not on file  Tobacco Use  . Smoking status: Never Smoker  . Smokeless tobacco: Never Used  Substance and Sexual Activity  . Alcohol use: Not Currently  . Drug use: Never  . Sexual activity: Not on file  Lifestyle  . Physical activity:    Days per week: Not on file    Minutes per session: Not on file  . Stress: Not on file  Relationships  . Social connections:    Talks on phone: Not on file    Gets together: Not on file    Attends religious service: Not on file    Active member of club or organization: Not on file    Attends  meetings of clubs or organizations: Not on file    Relationship status: Not on file  . Intimate partner violence:    Fear of current or ex partner: Not on file    Emotionally abused: Not on file    Physically abused: Not on file    Forced sexual activity: Not on file  Other Topics Concern  . Not on file  Social History Narrative  . Not on file     Physical Exam  Vital Signs and Nursing Notes reviewed Vitals:   05/19/18 0915 05/19/18 1401  BP:  130/68  Pulse:  75  Resp:  16  Temp: 98 F (36.7 C)   SpO2:  97%    CONSTITUTIONAL: Well-appearing, NAD NEURO:  Alert and oriented x 3, no focal deficits EYES:  eyes equal and reactive ENT/NECK:  no LAD, no JVD CARDIO: Regular rate, well-perfused, normal S1 and S2 PULM:  CTAB no wheezing or rhonchi GI/GU:  normal bowel sounds, non-distended, non-tender MSK/SPINE:  No gross deformities, no edema SKIN:  no rash, atraumatic PSYCH:  Appropriate speech and behavior  Diagnostic and Interventional Summary    EKG Interpretation  Date/Time:    Ventricular Rate:    PR Interval:    QRS Duration:   QT Interval:    QTC Calculation:  R Axis:     Text Interpretation:        Labs Reviewed - No data to display  DG Chest 2 View  Final Result      Medications - No data to display   Procedures Critical Care  ED Course and Medical Decision Making  I have reviewed the triage vital signs and the nursing notes.  Pertinent labs & imaging results that were available during my care of the patient were reviewed by me and considered in my medical decision making (see below for details).  Cold-like symptoms in this 63 year old female with no significant comorbidities, takes no daily medications.  Patient and patient's husband deny any recent travel.  Outside of the window for Tamiflu treatment.  Favoring viral illness, will screen with EKG and chest x-ray.  EKG is with no ischemic changes, chest x-ray is largely unremarkable, no pneumonia.   All consistent with upper respiratory infection, provided reassurance, Tessalon, low-dose Naprosyn for discomfort.  After the discussed management above, the patient was determined to be safe for discharge.  The patient was in agreement with this plan and all questions regarding their care were answered.  ED return precautions were discussed and the patient will return to the ED with any significant worsening of condition.  Barth Kirks. Sedonia Small, MD [place] [email or web address]  Final Clinical Impressions(s) / ED Diagnoses     ICD-10-CM   1. Viral URI with cough J06.9    B97.89   2. Cough R05 DG Chest 2 View    DG Chest 2 View    ED Discharge Orders         Ordered    benzonatate (TESSALON) 100 MG capsule  Every 8 hours     05/19/18 1406    naproxen (NAPROSYN) 250 MG tablet  2 times daily with meals     05/19/18 1406             Maudie Flakes, MD 05/19/18 1408

## 2018-05-19 NOTE — ED Notes (Signed)
Patient transported to X-ray 

## 2018-05-19 NOTE — Discharge Instructions (Addendum)
You were evaluated in the Emergency Department and after careful evaluation, we did not find any emergent condition requiring admission or further testing in the hospital.  Your symptoms today seem to be due to a virus.  Please use the medications provided as directed for cough or discomfort.  Please return to the Emergency Department if you experience any worsening of your condition.  We encourage you to follow up with a primary care provider.  Thank you for allowing [place] to be a part of your care.

## 2018-05-19 NOTE — ED Triage Notes (Signed)
Pt in via [facility] with cough and congestion x 3 days. Pt vietnamese-speaking, denies any international travel within past 30 days.

## 2018-05-20 ENCOUNTER — Encounter: Payer: Self-pay | Admitting: Allergy and Immunology

## 2018-07-25 ENCOUNTER — Ambulatory Visit (INDEPENDENT_AMBULATORY_CARE_PROVIDER_SITE_OTHER): Payer: Medicare Other | Admitting: Allergy and Immunology

## 2018-07-25 ENCOUNTER — Other Ambulatory Visit: Payer: Self-pay

## 2018-07-25 ENCOUNTER — Encounter: Payer: Self-pay | Admitting: Allergy and Immunology

## 2018-07-25 VITALS — BP 136/82 | HR 68 | Resp 16 | Ht <= 58 in | Wt 131.0 lb

## 2018-07-25 DIAGNOSIS — J4540 Moderate persistent asthma, uncomplicated: Secondary | ICD-10-CM

## 2018-07-25 DIAGNOSIS — K219 Gastro-esophageal reflux disease without esophagitis: Secondary | ICD-10-CM

## 2018-07-25 DIAGNOSIS — J3089 Other allergic rhinitis: Secondary | ICD-10-CM

## 2018-07-25 DIAGNOSIS — R519 Headache, unspecified: Secondary | ICD-10-CM | POA: Insufficient documentation

## 2018-07-25 MED ORDER — FAMOTIDINE 20 MG PO TABS: 20.0000 mg | ORAL_TABLET | Freq: Two times a day (BID) | ORAL | 5 refills | Status: DC

## 2018-07-25 MED ORDER — MONTELUKAST SODIUM 10 MG PO TABS: 10.0000 mg | ORAL_TABLET | Freq: Every day | ORAL | 3 refills | Status: DC

## 2018-07-25 MED ORDER — ALBUTEROL SULFATE HFA 108 (90 BASE) MCG/ACT IN AERS: INHALATION_SPRAY | RESPIRATORY_TRACT | 1 refills | Status: DC

## 2018-07-25 MED ORDER — FLUTICASONE-SALMETEROL 500-50 MCG/DOSE IN AEPB: 1.0000 | INHALATION_SPRAY | Freq: Two times a day (BID) | RESPIRATORY_TRACT | 5 refills | Status: DC

## 2018-07-25 NOTE — Assessment & Plan Note (Signed)
Stable.  Medicated nasal sprays will not be used due to perceived side effects.  Continue appropriate allergen avoidance measures and montelukast daily.  Nasal saline spray (i.e. Simply Saline) is recommended as needed if tolerated.

## 2018-07-25 NOTE — Assessment & Plan Note (Addendum)
Given the location and characteristics of the headache, this does not sound to sinus/allergy related.  I have recommended that the patient follow-up with her primary care physician.  She has agreed to do so.

## 2018-07-25 NOTE — Patient Instructions (Addendum)
Moderate persistent asthma Currently well controlled.  For now, continue Advair discus 500/50, 1 inhalation twice daily.  I have recommended that instead of using albuterol HFA every night before bed, take this medication every 4-6 hours when needed for coughing, wheezing, chest tightness, and/or shortness of breath.  Subjective and objective measures of pulmonary function will be followed and the treatment plan will be adjusted accordingly.  Seasonal and perennial allergic rhinitis Stable.  Medicated nasal sprays will not be used due to perceived side effects.  Continue appropriate allergen avoidance measures and montelukast daily.  Nasal saline spray (i.e. Simply Saline) is recommended as needed if tolerated.  Gastroesophageal reflux disease  Continue appropriate reflux lifestyle modifications   Take famotidine (Pepcid) 20 mg twice daily if needed.   Headache and arm pain Given the location and characteristics of the headache, this does not sound to sinus/allergy related.  I have recommended that the patient follow-up with her primary care physician.  She has agreed to do so.   Return in about 4 months (around 11/25/2018), or if symptoms worsen or fail to improve.

## 2018-07-25 NOTE — Progress Notes (Signed)
Follow-up Note  RE: Tina [name] MRN: [id number] DOB: 1955/12/26 Date of Office Visit: 07/25/2018  Primary care provider: Patient, No Pcp Per Referring provider: Gildardo Pounds, NP  History of present illness: Tina [name] is a 63 y.o. female with persistent asthma and allergic rhinitis presenting today for follow-up.  She was last seen in this clinic in December 2019.  She is accompanied today by an interpreter who assists with the history.  Normal since her previous visit her asthma has been well controlled while taking Advair 500/50 g, 1 inhalation twice daily.  She does not experience limitations in normal daily activities or nocturnal awakenings due to lower respiratory symptoms.  She reports that she has been using albuterol HFA every night before going to bed, however not because of lower respiratory symptoms but because she believed that she was supposed to take this medication on a scheduled rather than as needed basis. She reports that her nasal allergy symptoms have improved and are stable. Her only complaint is of some bilateral arm discomfort which, according to the interpreter, "goes up to the head and causes a headache."  The headache is generalized with specific pain or pressure over the sinuses.  Assessment and plan: Moderate persistent asthma Currently well controlled.  For now, continue Advair discus 500/50, 1 inhalation twice daily.  I have recommended that instead of using albuterol HFA every night before bed, take this medication every 4-6 hours when needed for coughing, wheezing, chest tightness, and/or shortness of breath.  Subjective and objective measures of pulmonary function will be followed and the treatment plan will be adjusted accordingly.  Seasonal and perennial allergic rhinitis Stable.  Medicated nasal sprays will not be used due to perceived side effects.  Continue appropriate allergen avoidance measures and montelukast daily.  Nasal saline spray  (i.e. Simply Saline) is recommended as needed if tolerated.  Gastroesophageal reflux disease  Continue appropriate reflux lifestyle modifications   Take famotidine (Pepcid) 20 mg twice daily if needed.   Headache and arm pain Given the location and characteristics of the headache, this does not sound to sinus/allergy related.  I have recommended that the patient follow-up with her primary care physician.  She has agreed to do so.   Meds ordered this encounter  Medications  . Fluticasone-Salmeterol (ADVAIR DISKUS) 500-50 MCG/DOSE AEPB    Sig: Inhale 1 puff into the lungs 2 (two) times daily.    Dispense:  60 each    Refill:  5  . albuterol (PROAIR HFA) 108 (90 Base) MCG/ACT inhaler    Sig: Use 1-2 puffs every 4-6 hours as needed for cough, wheeze or shortness of breathe    Dispense:  18 g    Refill:  1    Dispense whichever is approved through insurance  . famotidine (PEPCID) 20 MG tablet    Sig: Take 1 tablet (20 mg total) by mouth 2 (two) times daily.    Dispense:  60 tablet    Refill:  5  . montelukast (SINGULAIR) 10 MG tablet    Sig: Take 1 tablet (10 mg total) by mouth at bedtime.    Dispense:  90 tablet    Refill:  3    Diagnostics: Spirometry reveals an FEV1 of 1.37 L (77% predicted) with an FEV1 ratio of 108%.  Please see scanned spirometry results for details.    Physical examination: Blood pressure 136/82, pulse 68, resp. rate 16, height 4\' 10"  (1.473 m), weight 131 lb (59.4 kg), SpO2 94 %.  General: Alert, interactive, in no acute distress. HEENT: TMs pearly gray, turbinates moderately edematous without discharge, post-pharynx mildly erythematous. Neck: Supple without lymphadenopathy. Lungs: Clear to auscultation without wheezing, rhonchi or rales. CV: Normal S1, S2 without murmurs. Skin: Warm and dry, without lesions or rashes.  The following portions of the patient's history were reviewed and updated as appropriate: allergies, current medications, past  family history, past medical history, past social history, past surgical history and problem list.  Allergies as of 07/25/2018      Reactions   Hctz [hydrochlorothiazide] Shortness Of Breath   Lisinopril Shortness Of Breath      Medication List       Accurate as of Jul 25, 2018 12:22 PM. If you have any questions, ask your nurse or doctor.        STOP taking these medications   Fluticasone-Salmeterol 100-50 MCG/DOSE Aepb Commonly known as:  ADVAIR Replaced by:  Fluticasone-Salmeterol 500-50 MCG/DOSE Aepb Stopped by:  Edmonia Lynch, MD     TAKE these medications   albuterol 108 (90 Base) MCG/ACT inhaler Commonly known as:  ProAir HFA Use 1-2 puffs every 4-6 hours as needed for cough, wheeze or shortness of breathe   atorvastatin 20 MG tablet Commonly known as:  LIPITOR Take 1 tablet (20 mg total) by mouth daily.   benzonatate 100 MG capsule Commonly known as:  TESSALON Take 1 capsule (100 mg total) by mouth every 8 (eight) hours.   diclofenac 75 MG EC tablet Commonly known as:  VOLTAREN Take 1 tablet (75 mg total) by mouth 2 (two) times daily as needed for moderate pain.   famotidine 20 MG tablet Commonly known as:  Pepcid Take 1 tablet (20 mg total) by mouth 2 (two) times daily.   Fluticasone-Salmeterol 500-50 MCG/DOSE Aepb Commonly known as:  Advair Diskus Inhale 1 puff into the lungs 2 (two) times daily. Replaces:  Fluticasone-Salmeterol 100-50 MCG/DOSE Aepb Started by:  Edmonia Lynch, MD   Melatonin 5 MG Caps Take 1 capsule by mouth daily.   montelukast 10 MG tablet Commonly known as:  SINGULAIR Take 1 tablet (10 mg total) by mouth at bedtime.   naproxen 250 MG tablet Commonly known as:  Naprosyn Take 1 tablet (250 mg total) by mouth 2 (two) times daily with a meal.   omega-3 acid ethyl esters 1 g capsule Commonly known as:  LOVAZA Take by mouth 2 (two) times daily.   VITAMIN D3 COMPLETE PO Take 1,000 Units by mouth daily.       Allergies   Allergen Reactions  . Hctz [Hydrochlorothiazide] Shortness Of Breath  . Lisinopril Shortness Of Breath   Review of systems: Review of systems negative except as noted in HPI / PMHx or noted below: Constitutional: Negative.  HENT: Negative.   Eyes: Negative.  Respiratory: Negative.   Cardiovascular: Negative.  Gastrointestinal: Negative.  Genitourinary: Negative.  Musculoskeletal: Negative.  Neurological: Negative.  Endo/Heme/Allergies: Negative.  Cutaneous: Negative.  Past Medical History:  Diagnosis Date  . Asthma   . Nephrolithiasis    "came out on it's own" (12/21/2012)  . Prediabetes     Family History  Problem Relation Age of Onset  . Tuberculosis Mother   . Asthma Mother   . Asthma Father   . Tuberculosis Father     Social History   Socioeconomic History  . Marital status: Married    Spouse name: Not on file  . Number of children: Not on file  . Years of education: Not on  file  . Highest education level: Not on file  Occupational History  . Not on file  Social Needs  . Financial resource strain: Not on file  . Food insecurity:    Worry: Not on file    Inability: Not on file  . Transportation needs:    Medical: Not on file    Non-medical: Not on file  Tobacco Use  . Smoking status: Never Smoker  . Smokeless tobacco: Never Used  Substance and Sexual Activity  . Alcohol use: Not Currently  . Drug use: Never  . Sexual activity: Yes  Lifestyle  . Physical activity:    Days per week: Not on file    Minutes per session: Not on file  . Stress: Not on file  Relationships  . Social connections:    Talks on phone: Not on file    Gets together: Not on file    Attends religious service: Not on file    Active member of club or organization: Not on file    Attends meetings of clubs or organizations: Not on file    Relationship status: Not on file  . Intimate partner violence:    Fear of current or ex partner: Not on file    Emotionally abused: Not on  file    Physically abused: Not on file    Forced sexual activity: Not on file  Other Topics Concern  . Not on file  Social History Narrative   ** Merged History Encounter **        I appreciate the opportunity to take part in Wilburta's care. Please do not hesitate to contact me with questions.  Sincerely,   R. Edgar Frisk, MD

## 2018-07-25 NOTE — Assessment & Plan Note (Signed)
   Continue appropriate reflux lifestyle modifications   Take famotidine (Pepcid) 20 mg twice daily if needed.

## 2018-07-25 NOTE — Assessment & Plan Note (Signed)
Currently well controlled.  For now, continue Advair discus 500/50, 1 inhalation twice daily.  I have recommended that instead of using albuterol HFA every night before bed, take this medication every 4-6 hours when needed for coughing, wheezing, chest tightness, and/or shortness of breath.  Subjective and objective measures of pulmonary function will be followed and the treatment plan will be adjusted accordingly.

## 2018-11-28 ENCOUNTER — Other Ambulatory Visit: Payer: Self-pay

## 2018-11-28 ENCOUNTER — Encounter: Payer: Self-pay | Admitting: Allergy and Immunology

## 2018-11-28 ENCOUNTER — Ambulatory Visit (INDEPENDENT_AMBULATORY_CARE_PROVIDER_SITE_OTHER): Payer: Medicare Other | Admitting: Allergy and Immunology

## 2018-11-28 DIAGNOSIS — J4540 Moderate persistent asthma, uncomplicated: Secondary | ICD-10-CM

## 2018-11-28 DIAGNOSIS — J3089 Other allergic rhinitis: Secondary | ICD-10-CM

## 2018-11-28 DIAGNOSIS — K219 Gastro-esophageal reflux disease without esophagitis: Secondary | ICD-10-CM | POA: Diagnosis not present

## 2018-11-28 MED ORDER — FLUTICASONE-SALMETEROL 100-50 MCG/DOSE IN AEPB: 1.0000 | INHALATION_SPRAY | Freq: Two times a day (BID) | RESPIRATORY_TRACT | 5 refills | Status: DC

## 2018-11-28 MED ORDER — FAMOTIDINE 20 MG PO TABS: 20.0000 mg | ORAL_TABLET | Freq: Two times a day (BID) | ORAL | 5 refills | Status: DC

## 2018-11-28 MED ORDER — MONTELUKAST SODIUM 10 MG PO TABS: 10.0000 mg | ORAL_TABLET | Freq: Every day | ORAL | 3 refills | Status: DC

## 2018-11-28 NOTE — Assessment & Plan Note (Signed)
   Continue appropriate reflux lifestyle modifications   Take famotidine (Pepcid) 20 mg 1-2 times daily if needed.

## 2018-11-28 NOTE — Assessment & Plan Note (Addendum)
Currently well controlled.  For now, continue Advair discus 100/50, 1 inhalation twice daily, montelukast 10 mg daily at bedtime, and albuterol HFA, 1 to 2 inhalations every 4-6 hours if needed.    Subjective and objective measures of pulmonary function will be followed and the treatment plan will be adjusted accordingly.

## 2018-11-28 NOTE — Assessment & Plan Note (Signed)
Stable.  Medicated nasal sprays will not be used due to perceived side effects.  Continue appropriate allergen avoidance measures and montelukast daily.  Nasal saline spray (i.e. Simply Saline) is recommended as needed if tolerated. 

## 2018-11-28 NOTE — Progress Notes (Signed)
Follow-up Note  RE: Tina [name] MRN: [id number] DOB: 05/14/1955 Date of Office Visit: 11/28/2018  Primary care provider: Lind Covert, MD Referring provider: No ref. provider found  History of present illness: Tina [name] is a 63 y.o. female with persistent asthma, allergic rhinitis, and GERD presenting today for follow-up.  She was last seen in this clinic on Jul 25, 2018.  She is accompanied today by an interpreter who assists with the history.  She has been taking Advair 100-50 g, 1 inhalation twice daily.  While taking the Advair her asthma has been well controlled.  She has rarely required albuterol rescue and does not experience limitations in normal daily activities or nocturnal awakenings due to lower respiratory symptoms.  A prescription has been provided for clotrimazole 10 mg troches: Slowly dissolve in the mouth 5 times per day for 10 days, not to be chewed or swallowed whole.  She ran out of the Advair 2 days ago and needs a refill.  Her nasal allergy symptoms and acid reflux have been well controlled with montelukast daily and famotidine daily, respectively.  Assessment and plan: Moderate persistent asthma Currently well controlled.  For now, continue Advair discus 100/50, 1 inhalation twice daily, montelukast 10 mg daily at bedtime, and albuterol HFA, 1 to 2 inhalations every 4-6 hours if needed.    Subjective and objective measures of pulmonary function will be followed and the treatment plan will be adjusted accordingly.  Seasonal and perennial allergic rhinitis Stable.  Medicated nasal sprays will not be used due to perceived side effects.  Continue appropriate allergen avoidance measures and montelukast daily.  Nasal saline spray (i.e. Simply Saline) is recommended as needed if tolerated.  Gastroesophageal reflux disease  Continue appropriate reflux lifestyle modifications   Take famotidine (Pepcid) 20 mg 1-2 times daily if needed.    Meds ordered this  encounter  Medications  . famotidine (PEPCID) 20 MG tablet    Sig: Take 1 tablet (20 mg total) by mouth 2 (two) times daily.    Dispense:  60 tablet    Refill:  5  . montelukast (SINGULAIR) 10 MG tablet    Sig: Take 1 tablet (10 mg total) by mouth at bedtime.    Dispense:  90 tablet    Refill:  3  . Fluticasone-Salmeterol (ADVAIR DISKUS) 100-50 MCG/DOSE AEPB    Sig: Inhale 1 puff into the lungs 2 (two) times daily.    Dispense:  1 each    Refill:  5    Diagnostics: Spirometry today reveals an FVC of 1.66 L and an FEV1 of 1.45 L (78% predicted) with an FEV1 ratio of 113%.  The FEV1 today is consistent with her previous study.  Please see scanned spirometry results for details.    Physical examination: Blood pressure 138/80, pulse 70, temperature 98.5 F (36.9 C), temperature source Temporal, resp. rate 16, height 4\' 9"  (1.448 m), weight 133 lb 12.8 oz (60.7 kg), SpO2 96 %.  General: Alert, interactive, in no acute distress. HEENT: TMs pearly gray, turbinates mildly edematous without discharge, post-pharynx unremarkable. Neck: Supple without lymphadenopathy. Lungs: Clear to auscultation without wheezing, rhonchi or rales. CV: Normal S1, S2 without murmurs. Skin: Warm and dry, without lesions or rashes.  The following portions of the patient's history were reviewed and updated as appropriate: allergies, current medications, past family history, past medical history, past social history, past surgical history and problem list.  Allergies as of 11/28/2018      Reactions   Hctz [  hydrochlorothiazide] Shortness Of Breath   Lisinopril Shortness Of Breath      Medication List       Accurate as of November 28, 2018 12:22 PM. If you have any questions, ask your nurse or doctor.        albuterol 108 (90 Base) MCG/ACT inhaler Commonly known as: ProAir HFA Use 1-2 puffs every 4-6 hours as needed for cough, wheeze or shortness of breathe   atorvastatin 20 MG tablet Commonly known  as: LIPITOR Take 1 tablet (20 mg total) by mouth daily.   benzonatate 100 MG capsule Commonly known as: TESSALON Take 1 capsule (100 mg total) by mouth every 8 (eight) hours.   diclofenac 75 MG EC tablet Commonly known as: VOLTAREN Take 1 tablet (75 mg total) by mouth 2 (two) times daily as needed for moderate pain.   famotidine 20 MG tablet Commonly known as: Pepcid Take 1 tablet (20 mg total) by mouth 2 (two) times daily.   Fluticasone-Salmeterol 500-50 MCG/DOSE Aepb Commonly known as: Advair Diskus Inhale 1 puff into the lungs 2 (two) times daily. What changed: Another medication with the same name was added. Make sure you understand how and when to take each. Changed by: Edmonia Lynch, MD   Fluticasone-Salmeterol 100-50 MCG/DOSE Aepb Commonly known as: Advair Diskus Inhale 1 puff into the lungs 2 (two) times daily. What changed: You were already taking a medication with the same name, and this prescription was added. Make sure you understand how and when to take each. Changed by: Edmonia Lynch, MD   Melatonin 5 MG Caps Take 1 capsule by mouth daily.   montelukast 10 MG tablet Commonly known as: SINGULAIR Take 1 tablet (10 mg total) by mouth at bedtime.   naproxen 250 MG tablet Commonly known as: Naprosyn Take 1 tablet (250 mg total) by mouth 2 (two) times daily with a meal.   omega-3 acid ethyl esters 1 g capsule Commonly known as: LOVAZA Take by mouth 2 (two) times daily.   VITAMIN D3 COMPLETE PO Take 1,000 Units by mouth daily.       Allergies  Allergen Reactions  . Hctz [Hydrochlorothiazide] Shortness Of Breath  . Lisinopril Shortness Of Breath    I appreciate the opportunity to take part in Alesa's care. Please do not hesitate to contact me with questions.  Sincerely,   R. Edgar Frisk, MD

## 2018-11-28 NOTE — Patient Instructions (Addendum)
Moderate persistent asthma Currently well controlled.  For now, continue Advair discus 100/50, 1 inhalation twice daily, montelukast 10 mg daily at bedtime, and albuterol HFA, 1 to 2 inhalations every 4-6 hours if needed.    Subjective and objective measures of pulmonary function will be followed and the treatment plan will be adjusted accordingly.  Seasonal and perennial allergic rhinitis Stable.  Medicated nasal sprays will not be used due to perceived side effects.  Continue appropriate allergen avoidance measures and montelukast daily.  Nasal saline spray (i.e. Simply Saline) is recommended as needed if tolerated.  Gastroesophageal reflux disease  Continue appropriate reflux lifestyle modifications   Take famotidine (Pepcid) 20 mg 1-2 times daily if needed.    Return in about 5 months (around 04/30/2019), or if symptoms worsen or fail to improve.

## 2018-12-20 ENCOUNTER — Ambulatory Visit: Payer: Medicare Other | Attending: Nurse Practitioner | Admitting: Nurse Practitioner

## 2018-12-20 ENCOUNTER — Ambulatory Visit (HOSPITAL_BASED_OUTPATIENT_CLINIC_OR_DEPARTMENT_OTHER): Payer: Medicare Other | Admitting: Pharmacist

## 2018-12-20 ENCOUNTER — Encounter: Payer: Self-pay | Admitting: Nurse Practitioner

## 2018-12-20 ENCOUNTER — Other Ambulatory Visit: Payer: Self-pay

## 2018-12-20 VITALS — BP 133/74 | HR 74 | Temp 98.5°F | Ht <= 58 in | Wt 135.0 lb

## 2018-12-20 DIAGNOSIS — R7303 Prediabetes: Secondary | ICD-10-CM

## 2018-12-20 DIAGNOSIS — Z23 Encounter for immunization: Secondary | ICD-10-CM

## 2018-12-20 DIAGNOSIS — E785 Hyperlipidemia, unspecified: Secondary | ICD-10-CM

## 2018-12-20 DIAGNOSIS — G8929 Other chronic pain: Secondary | ICD-10-CM

## 2018-12-20 LAB — POCT GLYCOSYLATED HEMOGLOBIN (HGB A1C): Hemoglobin A1C: 6.3 % — AB (ref 4.0–5.6)

## 2018-12-20 LAB — GLUCOSE, POCT (MANUAL RESULT ENTRY): POC Glucose: 151 mg/dl — AB (ref 70–99)

## 2018-12-20 MED ORDER — DICLOFENAC SODIUM 1 % TD GEL: 2.0000 g | Freq: Four times a day (QID) | TRANSDERMAL | 1 refills | Status: AC

## 2018-12-20 MED ORDER — ATORVASTATIN CALCIUM 20 MG PO TABS: 20.0000 mg | ORAL_TABLET | Freq: Every day | ORAL | 3 refills | Status: DC

## 2018-12-20 MED ORDER — OMEGA-3-ACID ETHYL ESTERS 1 G PO CAPS: 1.0000 g | ORAL_CAPSULE | Freq: Two times a day (BID) | ORAL | 2 refills | Status: DC

## 2018-12-20 NOTE — Progress Notes (Signed)
Patient presents for vaccination against influenza per orders of Zelda. Consent given. Counseling provided. No contraindications exists. Vaccine administered without incident.   

## 2018-12-20 NOTE — Progress Notes (Signed)
Assessment & Plan:  Tina [name] was seen today for follow-up.  Diagnoses and all orders for this visit:  Prediabetes -     Glucose (CBG) -     HgB A1c  Dyslipidemia -     atorvastatin (LIPITOR) 20 MG tablet; Take 1 tablet (20 mg total) by mouth daily. -     omega-3 acid ethyl esters (LOVAZA) 1 g capsule; Take 1 capsule (1 g total) by mouth 2 (two) times daily. INSTRUCTIONS: Work on a low fat, heart healthy diet and participate in regular aerobic exercise program by working out at least 150 minutes per week; 5 days a week-30 minutes per day. Avoid red meat, fried foods. junk foods, sodas, sugary drinks, unhealthy snacking, alcohol and smoking.  Drink at least 48oz of water per day and monitor your carbohydrate intake daily.    Chronic right shoulder pain -     diclofenac sodium (VOLTAREN) 1 % GEL; Apply 2 g topically 4 (four) times daily.    Patient has been counseled on age-appropriate routine health concerns for screening and prevention. These are reviewed and up-to-date. Referrals have been placed accordingly. Immunizations are up-to-date or declined.    Subjective:   Chief Complaint  Patient presents with   Follow-up    Pt. is here for a follow up. Pt. stated she is having right shoulder pain and center hip pain. Pt. also would like medication for headaches.    HPI Tina [name] 63 y.o. female presents to office today for follow up.  She has an onsite interpreter accompanying her today.     Prediabetes Well controlled with diet. She does not monitor her blood glucose levels at home. Denies any hypo or hyperglycemic symptoms.  Lab Results  Component Value Date   HGBA1C 6.3 (A) 12/20/2018   Headaches She has not been taking singular as prescribed. She endorses headaches which are responsive to the tylenol that she has been taking. I also instructed her to take her singulair as prescribed as here headaches could be related to allergy symptoms. Headaches described as a dull ache  and mostly on left side and occipital area.She denies any visual disturbances, nausea, vomiting.   Joint Pain She has chronic right shoulder pain and right hip pain. Will not refill diclofenac PO however I will fill diclofenac gel for arthralgias.    Review of Systems  Constitutional: Negative for fever, malaise/fatigue and weight loss.  HENT: Negative.  Negative for nosebleeds.   Eyes: Negative.  Negative for blurred vision, double vision and photophobia.  Respiratory: Negative.  Negative for cough and shortness of breath.   Cardiovascular: Negative.  Negative for chest pain, palpitations and leg swelling.  Gastrointestinal: Negative.  Negative for heartburn, nausea and vomiting.  Musculoskeletal: Positive for joint pain. Negative for myalgias.       SEE HPI  Neurological: Positive for headaches. Negative for dizziness, focal weakness and seizures.  Endo/Heme/Allergies: Positive for environmental allergies.  Psychiatric/Behavioral: Negative.  Negative for suicidal ideas.    Past Medical History:  Diagnosis Date   Asthma    Nephrolithiasis    "came out on it's own" (12/21/2012)   Prediabetes     Past Surgical History:  Procedure Laterality Date   CHOLECYSTECTOMY N/A 12/21/2012   Procedure: LAPAROSCOPIC CHOLECYSTECTOMY;  Surgeon: Ralene Ok, MD;  Location: [facility];  Service: General;  Laterality: N/A;   KIDNEY STONE SURGERY     spouse denies this hx on 12/21/2012   LAPAROSCOPIC CHOLECYSTECTOMY  12/21/2012    Family  History  Problem Relation Age of Onset   Tuberculosis Mother    Asthma Mother    Asthma Father    Tuberculosis Father     Social History Reviewed with no changes to be made today.   Outpatient Medications Prior to Visit  Medication Sig Dispense Refill   albuterol (PROAIR HFA) 108 (90 Base) MCG/ACT inhaler Use 1-2 puffs every 4-6 hours as needed for cough, wheeze or shortness of breathe 18 g 1   famotidine (PEPCID) 20 MG tablet Take 1 tablet  (20 mg total) by mouth 2 (two) times daily. 60 tablet 5   Melatonin 5 MG CAPS Take 1 capsule by mouth daily.     montelukast (SINGULAIR) 10 MG tablet Take 1 tablet (10 mg total) by mouth at bedtime. 90 tablet 3   Multiple Vitamins-Minerals (VITAMIN D3 COMPLETE PO) Take 1,000 Units by mouth daily.     Fluticasone-Salmeterol (ADVAIR DISKUS) 500-50 MCG/DOSE AEPB Inhale 1 puff into the lungs 2 (two) times daily. 60 each 5   Fluticasone-Salmeterol (ADVAIR DISKUS) 100-50 MCG/DOSE AEPB Inhale 1 puff into the lungs 2 (two) times daily. (Patient not taking: Reported on 12/20/2018) 1 each 5   atorvastatin (LIPITOR) 20 MG tablet Take 1 tablet (20 mg total) by mouth daily. (Patient not taking: Reported on 12/20/2018) 90 tablet 3   benzonatate (TESSALON) 100 MG capsule Take 1 capsule (100 mg total) by mouth every 8 (eight) hours. (Patient not taking: Reported on 12/20/2018) 21 capsule 0   diclofenac (VOLTAREN) 75 MG EC tablet Take 1 tablet (75 mg total) by mouth 2 (two) times daily as needed for moderate pain. (Patient not taking: Reported on 12/20/2018) 30 tablet 1   naproxen (NAPROSYN) 250 MG tablet Take 1 tablet (250 mg total) by mouth 2 (two) times daily with a meal. (Patient not taking: Reported on 12/20/2018) 15 tablet 0   omega-3 acid ethyl esters (LOVAZA) 1 g capsule Take by mouth 2 (two) times daily.     No facility-administered medications prior to visit.     Allergies  Allergen Reactions   Hctz [Hydrochlorothiazide] Shortness Of Breath   Lisinopril Shortness Of Breath       Objective:    BP 133/74 (BP Location: Left Arm, Patient Position: Sitting, Cuff Size: Normal)    Pulse 74    Temp 98.5 F (36.9 C) (Oral)    Ht 4\' 10"  (1.473 m)    Wt 135 lb (61.2 kg)    SpO2 97%    BMI 28.22 kg/m  Wt Readings from Last 3 Encounters:  12/20/18 135 lb (61.2 kg)  11/28/18 133 lb 12.8 oz (60.7 kg)  07/25/18 131 lb (59.4 kg)    Physical Exam Vitals signs and nursing note reviewed.    Constitutional:      Appearance: She is well-developed.  HENT:     Head: Normocephalic and atraumatic.  Neck:     Musculoskeletal: Normal range of motion.  Cardiovascular:     Rate and Rhythm: Normal rate and regular rhythm.     Heart sounds: Normal heart sounds. No murmur. No friction rub. No gallop.   Pulmonary:     Effort: Pulmonary effort is normal. No tachypnea or respiratory distress.     Breath sounds: Normal breath sounds. No decreased breath sounds, wheezing, rhonchi or rales.  Chest:     Chest wall: No tenderness.  Abdominal:     General: Bowel sounds are normal.     Palpations: Abdomen is soft.  Musculoskeletal: Normal range of  motion.     Right shoulder: She exhibits normal range of motion, no tenderness and no deformity.  Skin:    General: Skin is warm and dry.  Neurological:     Mental Status: She is alert and oriented to person, place, and time.     Coordination: Coordination normal.  Psychiatric:        Behavior: Behavior normal. Behavior is cooperative.        Thought Content: Thought content normal.        Judgment: Judgment normal.          Patient has been counseled extensively about nutrition and exercise as well as the importance of adherence with medications and regular follow-up. The patient was given clear instructions to go to ER or return to medical center if symptoms don't improve, worsen or new problems develop. The patient verbalized understanding.   Follow-up: Return for pap smear.   Gildardo Pounds, FNP-BC [facility] and [place], [place]   12/21/2018, 10:03 PM

## 2018-12-21 ENCOUNTER — Encounter: Payer: Self-pay | Admitting: Nurse Practitioner

## 2019-02-06 ENCOUNTER — Ambulatory Visit (HOSPITAL_BASED_OUTPATIENT_CLINIC_OR_DEPARTMENT_OTHER): Payer: Medicare Other | Admitting: Nurse Practitioner

## 2019-02-06 ENCOUNTER — Other Ambulatory Visit (HOSPITAL_COMMUNITY)
Admission: RE | Admit: 2019-02-06 | Discharge: 2019-02-06 | Disposition: A | Discharge status: Home / Self Care | Payer: Medicare Other | Source: Ambulatory Visit | Attending: Nurse Practitioner | Admitting: Nurse Practitioner

## 2019-02-06 ENCOUNTER — Other Ambulatory Visit: Payer: Self-pay

## 2019-02-06 ENCOUNTER — Encounter: Payer: Self-pay | Admitting: Nurse Practitioner

## 2019-02-06 VITALS — BP 146/78 | HR 74 | Temp 97.7°F | Ht <= 58 in | Wt 130.0 lb

## 2019-02-06 DIAGNOSIS — Z780 Asymptomatic menopausal state: Secondary | ICD-10-CM | POA: Insufficient documentation

## 2019-02-06 DIAGNOSIS — Z124 Encounter for screening for malignant neoplasm of cervix: Secondary | ICD-10-CM

## 2019-02-06 DIAGNOSIS — Z1151 Encounter for screening for human papillomavirus (HPV): Secondary | ICD-10-CM | POA: Insufficient documentation

## 2019-02-06 NOTE — Progress Notes (Signed)
Assessment & Plan:  Tina [name] was seen today for gynecologic exam.  Diagnoses and all orders for this visit:  Encounter for Papanicolaou smear for cervical cancer screening -     PAP WITH EVERYTHING -     WET PREP    Patient has been counseled on age-appropriate routine health concerns for screening and prevention. These are reviewed and up-to-date. Referrals have been placed accordingly. Immunizations are up-to-date or declined.    Subjective:   Chief Complaint  Patient presents with  . Gynecologic Exam    Pt. is here for a pap smear.    HPI Tina [name] 63 y.o. female presents to office today   Review of Systems  Constitutional: Negative.  Negative for chills, fever, malaise/fatigue and weight loss.  Respiratory: Negative.  Negative for cough, shortness of breath and wheezing.   Cardiovascular: Negative.  Negative for chest pain, orthopnea and leg swelling.  Gastrointestinal: Negative for abdominal pain.  Genitourinary: Negative.  Negative for flank pain.  Skin: Negative.  Negative for rash.  Psychiatric/Behavioral: Negative for suicidal ideas.    Past Medical History:  Diagnosis Date  . Asthma   . Nephrolithiasis    "came out on it's own" (12/21/2012)  . Prediabetes     Past Surgical History:  Procedure Laterality Date  . CHOLECYSTECTOMY N/A 12/21/2012   Procedure: LAPAROSCOPIC CHOLECYSTECTOMY;  Surgeon: Ralene Ok, MD;  Location: [facility];  Service: General;  Laterality: N/A;  . KIDNEY STONE SURGERY     spouse denies this hx on 12/21/2012  . LAPAROSCOPIC CHOLECYSTECTOMY  12/21/2012    Family History  Problem Relation Age of Onset  . Tuberculosis Mother   . Asthma Mother   . Asthma Father   . Tuberculosis Father     Social History Reviewed with no changes to be made today.   Outpatient Medications Prior to Visit  Medication Sig Dispense Refill  . albuterol (PROAIR HFA) 108 (90 Base) MCG/ACT inhaler Use 1-2 puffs every 4-6 hours as needed for cough,  wheeze or shortness of breathe 18 g 1  . atorvastatin (LIPITOR) 20 MG tablet Take 1 tablet (20 mg total) by mouth daily. 90 tablet 3  . famotidine (PEPCID) 20 MG tablet Take 1 tablet (20 mg total) by mouth 2 (two) times daily. 60 tablet 5  . Fluticasone-Salmeterol (ADVAIR DISKUS) 100-50 MCG/DOSE AEPB Inhale 1 puff into the lungs 2 (two) times daily. 1 each 5  . Melatonin 5 MG CAPS Take 1 capsule by mouth daily.    Marland Kitchen MELATONIN PO Take 5 mg by mouth.    . montelukast (SINGULAIR) 10 MG tablet Take 1 tablet (10 mg total) by mouth at bedtime. 90 tablet 3  . Multiple Vitamins-Minerals (VITAMIN D3 COMPLETE PO) Take 1,000 Units by mouth daily.    Marland Kitchen omega-3 acid ethyl esters (LOVAZA) 1 g capsule Take 1 capsule (1 g total) by mouth 2 (two) times daily. 180 capsule 2  . VITAMIN D PO Take 2,000 Units by mouth.     No facility-administered medications prior to visit.     Allergies  Allergen Reactions  . Hctz [Hydrochlorothiazide] Shortness Of Breath  . Lisinopril Shortness Of Breath       Objective:    BP (!) 146/78 (BP Location: Left Arm, Patient Position: Sitting, Cuff Size: Normal)   Pulse 74   Temp 97.7 F (36.5 C) (Oral)   Ht 4\' 10"  (1.473 m)   Wt 130 lb (59 kg)   SpO2 97%   BMI 27.17 kg/m  Wt Readings from Last 3 Encounters:  02/06/19 130 lb (59 kg)  12/20/18 135 lb (61.2 kg)  11/28/18 133 lb 12.8 oz (60.7 kg)    Physical Exam Exam conducted with a chaperone present.  Constitutional:      Appearance: She is well-developed.  HENT:     Head: Normocephalic.  Cardiovascular:     Rate and Rhythm: Normal rate and regular rhythm.     Heart sounds: Normal heart sounds.  Pulmonary:     Effort: Pulmonary effort is normal.     Breath sounds: Normal breath sounds.  Abdominal:     General: Bowel sounds are normal.     Palpations: Abdomen is soft.     Hernia: There is no hernia in the left inguinal area.  Genitourinary:    Exam position: Lithotomy position.     Labia:         Right: No rash, tenderness, lesion or injury.        Left: No rash, tenderness, lesion or injury.      Vagina: Normal. No signs of injury and foreign body. No vaginal discharge, erythema, tenderness or bleeding.     Cervix: Normal.     Uterus: Not deviated and not enlarged.      Adnexa:        Right: No mass, tenderness or fullness.         Left: No mass, tenderness or fullness.       Rectum: Normal. No external hemorrhoid.  Lymphadenopathy:     Lower Body: No right inguinal adenopathy. No left inguinal adenopathy.  Skin:    General: Skin is warm and dry.  Neurological:     Mental Status: She is alert and oriented to person, place, and time.  Psychiatric:        Behavior: Behavior normal.        Thought Content: Thought content normal.        Judgment: Judgment normal.          Patient has been counseled extensively about nutrition and exercise as well as the importance of adherence with medications and regular follow-up. The patient was given clear instructions to go to ER or return to medical center if symptoms don't improve, worsen or new problems develop. The patient verbalized understanding.   Follow-up: Return in about 3 months (around 05/07/2019) for HTN.   Gildardo Pounds, FNP-BC [facility] and [facility] [place], [place]   02/06/2019, 10:12 AM

## 2019-02-07 LAB — CERVICOVAGINAL ANCILLARY ONLY
Bacterial Vaginitis (gardnerella): NEGATIVE
Candida Glabrata: NEGATIVE
Candida Vaginitis: NEGATIVE
Chlamydia: NEGATIVE
Comment: NEGATIVE
Comment: NEGATIVE
Comment: NEGATIVE
Comment: NEGATIVE
Comment: NEGATIVE
Comment: NORMAL
Neisseria Gonorrhea: NEGATIVE
Trichomonas: NEGATIVE

## 2019-02-07 LAB — CYTOLOGY - PAP
Comment: NEGATIVE
Diagnosis: NEGATIVE
High risk HPV: NEGATIVE

## 2019-04-24 ENCOUNTER — Ambulatory Visit: Payer: Medicare Other | Attending: Internal Medicine

## 2019-04-24 DIAGNOSIS — Z23 Encounter for immunization: Secondary | ICD-10-CM

## 2019-04-24 NOTE — Progress Notes (Signed)
   Covid-19 Vaccination Clinic  Name:  Tina [name]    MRN: [id number] DOB: Jan 28, 1956  04/24/2019  [name] was observed post Covid-19 immunization for 15 minutes without incidence. She was provided with Vaccine Information Sheet and instruction to access the V-Safe system.   [name] was instructed to call 911 with any severe reactions post vaccine: Marland Kitchen Difficulty breathing  . Swelling of your face and throat  . A fast heartbeat  . A bad rash all over your body  . Dizziness and weakness    Immunizations Administered    Name Date Dose VIS Date Route   Pfizer COVID-19 Vaccine 04/24/2019  3:33 PM 0.3 mL 02/17/2019 Intramuscular   Manufacturer: [place]   Lot: [id number]   [place]: [id number]

## 2019-05-01 ENCOUNTER — Ambulatory Visit (INDEPENDENT_AMBULATORY_CARE_PROVIDER_SITE_OTHER): Payer: Medicare Other | Admitting: Allergy and Immunology

## 2019-05-01 ENCOUNTER — Other Ambulatory Visit: Payer: Self-pay

## 2019-05-01 ENCOUNTER — Encounter: Payer: Self-pay | Admitting: Allergy and Immunology

## 2019-05-01 VITALS — BP 142/70 | HR 68 | Temp 97.5°F | Resp 18 | Ht 58.5 in | Wt 131.8 lb

## 2019-05-01 DIAGNOSIS — J4540 Moderate persistent asthma, uncomplicated: Secondary | ICD-10-CM | POA: Diagnosis not present

## 2019-05-01 DIAGNOSIS — K219 Gastro-esophageal reflux disease without esophagitis: Secondary | ICD-10-CM

## 2019-05-01 DIAGNOSIS — J3089 Other allergic rhinitis: Secondary | ICD-10-CM | POA: Diagnosis not present

## 2019-05-01 MED ORDER — FLUTICASONE-SALMETEROL 100-50 MCG/DOSE IN AEPB: 1.0000 | INHALATION_SPRAY | Freq: Two times a day (BID) | RESPIRATORY_TRACT | 5 refills | Status: DC

## 2019-05-01 NOTE — Progress Notes (Signed)
Follow-up Note  RE: Tina [name] MRN: [id number] DOB: 1956/01/15 Date of Office Visit: 05/01/2019  Primary care provider: Gildardo Pounds, NP Referring provider: Lind Covert, *  History of present illness: Tina [name] is a 64 y.o. female with persistent asthma, allergic rhinitis, and gastroesophageal reflux presenting today for follow-up.  She was last seen in this clinic in September 2020.  She is accompanied today by an interpreter who assists with the history.  She reports that in the interval since her previous visit her asthma has been stable while taking Advair discus 100-50 g, 1 inhalation twice daily, and montelukast 10 mg daily at bedtime.  She rarely experiences asthma symptoms, when she does the symptoms are typically at nighttime.  Her symptoms occur every 2 to 3 weeks on average.  She does not experience limitations in normal daily activities.  She has no nasal allergy symptom complaints today.  Assessment and plan: Moderate persistent asthma Stable.  For now, continue Advair discus 100/50, 1 inhalation twice daily, montelukast 10 mg daily at bedtime, and albuterol HFA, 1 to 2 inhalations every 4-6 hours if needed.    Subjective and objective measures of pulmonary function will be followed and the treatment plan will be adjusted accordingly.  Seasonal and perennial allergic rhinitis  Medicated nasal sprays will not be used due to perceived side effects.  Continue appropriate allergen avoidance measures and montelukast daily.  Nasal saline spray (i.e. Simply Saline) is recommended as needed if tolerated.  Gastroesophageal reflux disease  Continue appropriate reflux lifestyle modifications   Take famotidine (Pepcid) 20 mg 1-2 times daily if needed.    Meds ordered this encounter  Medications  . Fluticasone-Salmeterol (ADVAIR DISKUS) 100-50 MCG/DOSE AEPB    Sig: Inhale 1 puff into the lungs 2 (two) times daily.    Dispense:  1 each    Refill:  5     Diagnostics: Spirometry reveals an FVC of 1.17 L and an FEV1 of 1.31 L and an FEV1 ratio of 99%.  Please see scanned spirometry results for details.    Physical examination: Blood pressure (!) 142/70, pulse 68, temperature (!) 97.5 F (36.4 C), temperature source Temporal, resp. rate 18, height 4' 10.5" (1.486 m), weight 131 lb 12.8 oz (59.8 kg), SpO2 98 %.  General: Alert, interactive, in no acute distress. HEENT: TMs pearly gray, turbinates moderately edematous without discharge, post-pharynx unremarkable. Neck: Supple without lymphadenopathy. Lungs: Clear to auscultation without wheezing, rhonchi or rales. CV: Normal S1, S2 without murmurs. Skin: Warm and dry, without lesions or rashes.  The following portions of the patient's history were reviewed and updated as appropriate: allergies, current medications, past family history, past medical history, past social history, past surgical history and problem list.  Current Outpatient Medications  Medication Sig Dispense Refill  . albuterol (PROAIR HFA) 108 (90 Base) MCG/ACT inhaler Use 1-2 puffs every 4-6 hours as needed for cough, wheeze or shortness of breathe 18 g 1  . atorvastatin (LIPITOR) 20 MG tablet Take 1 tablet (20 mg total) by mouth daily. 90 tablet 3  . famotidine (PEPCID) 20 MG tablet Take 1 tablet (20 mg total) by mouth 2 (two) times daily. 60 tablet 5  . Fluticasone-Salmeterol (ADVAIR DISKUS) 100-50 MCG/DOSE AEPB Inhale 1 puff into the lungs 2 (two) times daily. 1 each 5  . Melatonin 5 MG CAPS Take 1 capsule by mouth daily.    Marland Kitchen MELATONIN PO Take 5 mg by mouth.    . montelukast (SINGULAIR) 10 MG  tablet Take 1 tablet (10 mg total) by mouth at bedtime. 90 tablet 3  . Multiple Vitamins-Minerals (VITAMIN D3 COMPLETE PO) Take 1,000 Units by mouth daily.    Marland Kitchen VITAMIN D PO Take 2,000 Units by mouth.    . omega-3 acid ethyl esters (LOVAZA) 1 g capsule Take 1 capsule (1 g total) by mouth 2 (two) times daily. 180 capsule 2   No  current facility-administered medications for this visit.    Allergies  Allergen Reactions  . Hctz [Hydrochlorothiazide] Shortness Of Breath  . Lisinopril Shortness Of Breath    I appreciate the opportunity to take part in Lular's care. Please do not hesitate to contact me with questions.  Sincerely,   R. Edgar Frisk, MD

## 2019-05-01 NOTE — Assessment & Plan Note (Signed)
   Continue appropriate reflux lifestyle modifications   Take famotidine (Pepcid) 20 mg 1-2 times daily if needed.

## 2019-05-01 NOTE — Assessment & Plan Note (Signed)
   Medicated nasal sprays will not be used due to perceived side effects.  Continue appropriate allergen avoidance measures and montelukast daily.  Nasal saline spray (i.e. Simply Saline) is recommended as needed if tolerated.

## 2019-05-01 NOTE — Assessment & Plan Note (Signed)
Stable.  For now, continue Advair discus 100/50, 1 inhalation twice daily, montelukast 10 mg daily at bedtime, and albuterol HFA, 1 to 2 inhalations every 4-6 hours if needed.    Subjective and objective measures of pulmonary function will be followed and the treatment plan will be adjusted accordingly.

## 2019-05-01 NOTE — Patient Instructions (Addendum)
Moderate persistent asthma Stable.  For now, continue Advair discus 100/50, 1 inhalation twice daily, montelukast 10 mg daily at bedtime, and albuterol HFA, 1 to 2 inhalations every 4-6 hours if needed.    Subjective and objective measures of pulmonary function will be followed and the treatment plan will be adjusted accordingly.  Seasonal and perennial allergic rhinitis  Medicated nasal sprays will not be used due to perceived side effects.  Continue appropriate allergen avoidance measures and montelukast daily.  Nasal saline spray (i.e. Simply Saline) is recommended as needed if tolerated.  Gastroesophageal reflux disease  Continue appropriate reflux lifestyle modifications   Take famotidine (Pepcid) 20 mg 1-2 times daily if needed.    Return in about 4 months (around 08/29/2019), or if symptoms worsen or fail to improve.

## 2019-05-08 ENCOUNTER — Ambulatory Visit: Payer: Medicare Other | Attending: Nurse Practitioner | Admitting: Nurse Practitioner

## 2019-05-08 ENCOUNTER — Other Ambulatory Visit: Payer: Self-pay

## 2019-05-08 ENCOUNTER — Encounter: Payer: Self-pay | Admitting: Nurse Practitioner

## 2019-05-08 ENCOUNTER — Ambulatory Visit (HOSPITAL_BASED_OUTPATIENT_CLINIC_OR_DEPARTMENT_OTHER): Payer: Medicare Other | Admitting: Pharmacist

## 2019-05-08 VITALS — BP 128/78 | HR 69 | Temp 98.1°F | Resp 16 | Ht 59.0 in | Wt 132.0 lb

## 2019-05-08 DIAGNOSIS — E559 Vitamin D deficiency, unspecified: Secondary | ICD-10-CM | POA: Diagnosis not present

## 2019-05-08 DIAGNOSIS — Z23 Encounter for immunization: Secondary | ICD-10-CM | POA: Diagnosis not present

## 2019-05-08 DIAGNOSIS — E785 Hyperlipidemia, unspecified: Secondary | ICD-10-CM

## 2019-05-08 DIAGNOSIS — Z9189 Other specified personal risk factors, not elsewhere classified: Secondary | ICD-10-CM

## 2019-05-08 DIAGNOSIS — G44229 Chronic tension-type headache, not intractable: Secondary | ICD-10-CM | POA: Diagnosis not present

## 2019-05-08 DIAGNOSIS — J4520 Mild intermittent asthma, uncomplicated: Secondary | ICD-10-CM

## 2019-05-08 DIAGNOSIS — Z130 Encounter for screening for diseases of the blood and blood-forming organs and certain disorders involving the immune mechanism: Secondary | ICD-10-CM

## 2019-05-08 DIAGNOSIS — Z1231 Encounter for screening mammogram for malignant neoplasm of breast: Secondary | ICD-10-CM

## 2019-05-08 DIAGNOSIS — R7303 Prediabetes: Secondary | ICD-10-CM | POA: Diagnosis not present

## 2019-05-08 LAB — GLUCOSE, POCT (MANUAL RESULT ENTRY): POC Glucose: 123 mg/dl — AB (ref 70–99)

## 2019-05-08 NOTE — Progress Notes (Signed)
Assessment & Plan:  Diagnoses and all orders for this visit:  Prediabetes -     Glucose (CBG) -     CMP14+EGFR  Chronic tension-type headache, not intractable -     Ambulatory referral to Ophthalmology  Mild intermittent asthma without complication Well controlled  Dyslipidemia, goal LDL below 100 -     Lipid panel INSTRUCTIONS: Work on a low fat, heart healthy diet and participate in regular aerobic exercise program by working out at least 150 minutes per week; 5 days a week-30 minutes per day. Avoid red meat/beef/steak,  fried foods. junk foods, sodas, sugary drinks, unhealthy snacking, alcohol and smoking.  Drink at least 80 oz of water per day and monitor your carbohydrate intake daily.    Screening for deficiency anemia -     CBC  Vitamin D deficiency -     VITAMIN D 25 Hydroxy (Vit-D Deficiency, Fractures)  Breast cancer screening by mammogram -     MM 3D SCREEN BREAST BILATERAL; Future  At risk for decreased bone density -     DG Bone Density; Future    Patient has been counseled on age-appropriate routine health concerns for screening and prevention. These are reviewed and up-to-date. Referrals have been placed accordingly. Immunizations are up-to-date or declined.    Subjective:  HPI Tina [name] 64 y.o. female presents to office today for follow up. . has a past medical history of Asthma, Nephrolithiasis, and Prediabetes.    Prediabetes Well controlled with diet alone. She does not monitor her blood glucose levels at home.  Lab Results  Component Value Date   HGBA1C 6.3 (A) 12/20/2018   Headaches  Occurring 1-2 times per week. Relieved with OTC tylenol. Overdue for eye exam. Associated symptoms: light sensitivity. Drinks caffeine.                                                           Asthma Well controlled. She denies cough, wheezing or shortness of breath.Taking advair 100-50 1 puff BID and albuterol inhaler prn. Uses singulair at night.      Dyslipidemia LDL at goal of <100. Taking atorvastatin 20 mg daily. There are no statin intolerance or myalgias.  Lab Results  Component Value Date   LDLCALC 82 05/08/2019    Review of Systems  Constitutional: Negative for fever, malaise/fatigue and weight loss.  HENT: Negative.  Negative for nosebleeds.   Eyes: Negative.  Negative for blurred vision, double vision and photophobia.  Respiratory: Negative.  Negative for cough and shortness of breath.   Cardiovascular: Negative.  Negative for chest pain, palpitations and leg swelling.  Gastrointestinal: Negative.  Negative for heartburn, nausea and vomiting.  Musculoskeletal: Negative.  Negative for myalgias.  Neurological: Positive for headaches. Negative for dizziness, focal weakness and seizures.  Psychiatric/Behavioral: Negative.  Negative for suicidal ideas.    Past Medical History:  Diagnosis Date  . Asthma   . Nephrolithiasis    "came out on it's own" (12/21/2012)  . Prediabetes     Past Surgical History:  Procedure Laterality Date  . CHOLECYSTECTOMY N/A 12/21/2012   Procedure: LAPAROSCOPIC CHOLECYSTECTOMY;  Surgeon: Ralene Ok, MD;  Location: [facility];  Service: General;  Laterality: N/A;  . KIDNEY STONE SURGERY     spouse denies this hx on 12/21/2012  . LAPAROSCOPIC CHOLECYSTECTOMY  12/21/2012    Family History  Problem Relation Age of Onset  . Tuberculosis Mother   . Asthma Mother   . Asthma Father   . Tuberculosis Father     Social History Reviewed with no changes to be made today.   Outpatient Medications Prior to Visit  Medication Sig Dispense Refill  . albuterol (PROAIR HFA) 108 (90 Base) MCG/ACT inhaler Use 1-2 puffs every 4-6 hours as needed for cough, wheeze or shortness of breathe 18 g 1  . atorvastatin (LIPITOR) 20 MG tablet Take 1 tablet (20 mg total) by mouth daily. 90 tablet 3  . famotidine (PEPCID) 20 MG tablet Take 1 tablet (20 mg total) by mouth 2 (two) times daily. 60 tablet 5  .  Fluticasone-Salmeterol (ADVAIR DISKUS) 100-50 MCG/DOSE AEPB Inhale 1 puff into the lungs 2 (two) times daily. 1 each 5  . montelukast (SINGULAIR) 10 MG tablet Take 1 tablet (10 mg total) by mouth at bedtime. 90 tablet 3  . VITAMIN D PO Take 2,000 Units by mouth.    . Melatonin 5 MG CAPS Take 1 capsule by mouth daily.    Marland Kitchen MELATONIN PO Take 5 mg by mouth.    . Multiple Vitamins-Minerals (VITAMIN D3 COMPLETE PO) Take 1,000 Units by mouth daily.    Marland Kitchen omega-3 acid ethyl esters (LOVAZA) 1 g capsule Take 1 capsule (1 g total) by mouth 2 (two) times daily. 180 capsule 2   No facility-administered medications prior to visit.    Allergies  Allergen Reactions  . Hctz [Hydrochlorothiazide] Shortness Of Breath  . Lisinopril Shortness Of Breath       Objective:    BP 128/78 (BP Location: Left Arm)   Pulse 69   Temp 98.1 F (36.7 C)   Resp 16   Ht 4' 11"  (1.499 m)   Wt 132 lb (59.9 kg)   SpO2 98%   BMI 26.66 kg/m  Wt Readings from Last 3 Encounters:  05/08/19 132 lb (59.9 kg)  05/01/19 131 lb 12.8 oz (59.8 kg)  02/06/19 130 lb (59 kg)    Physical Exam Vitals and nursing note reviewed.  Constitutional:      Appearance: She is well-developed.  HENT:     Head: Normocephalic and atraumatic.  Cardiovascular:     Rate and Rhythm: Normal rate and regular rhythm.     Heart sounds: Normal heart sounds. No murmur. No friction rub. No gallop.   Pulmonary:     Effort: Pulmonary effort is normal. No tachypnea or respiratory distress.     Breath sounds: Normal breath sounds. No decreased breath sounds, wheezing, rhonchi or rales.  Chest:     Chest wall: No tenderness.  Abdominal:     General: Bowel sounds are normal.     Palpations: Abdomen is soft.  Musculoskeletal:        General: Normal range of motion.     Cervical back: Normal range of motion.  Skin:    General: Skin is warm and dry.  Neurological:     Mental Status: She is alert and oriented to person, place, and time.      Coordination: Coordination normal.  Psychiatric:        Behavior: Behavior normal. Behavior is cooperative.        Thought Content: Thought content normal.        Judgment: Judgment normal.          Patient has been counseled extensively about nutrition and exercise as well as the importance  of adherence with medications and regular follow-up. The patient was given clear instructions to go to ER or return to medical center if symptoms don't improve, worsen or new problems develop. The patient verbalized understanding.   Follow-up: Return in about 6 weeks (around 06/19/2019) for HEADACHES.   Gildardo Pounds, FNP-BC [facility] and [place], [place]   06/11/2019, 8:05 PM

## 2019-05-08 NOTE — Progress Notes (Signed)
DM f /u  C /o headache on and off for X 18yrs  Pt sates feeling cold in the ears when headache comes CBG 123 fasting

## 2019-05-08 NOTE — Progress Notes (Signed)
Patient presents for vaccination against strep pneumo per orders of Zelda. Consent given. Counseling provided. No contraindications exists. Vaccine administered without incident.

## 2019-05-09 LAB — VITAMIN D 25 HYDROXY (VIT D DEFICIENCY, FRACTURES): Vit D, 25-Hydroxy: 37.5 ng/mL (ref 30.0–100.0)

## 2019-05-09 LAB — CMP14+EGFR
ALT: 24 IU/L (ref 0–32)
AST: 14 IU/L (ref 0–40)
Albumin/Globulin Ratio: 2 (ref 1.2–2.2)
Albumin: 5 g/dL — ABNORMAL HIGH (ref 3.8–4.8)
Alkaline Phosphatase: 73 IU/L (ref 39–117)
BUN/Creatinine Ratio: 14 (ref 12–28)
BUN: 14 mg/dL (ref 8–27)
Bilirubin Total: 0.3 mg/dL (ref 0.0–1.2)
CO2: 20 mmol/L (ref 20–29)
Calcium: 10.1 mg/dL (ref 8.7–10.3)
Chloride: 106 mmol/L (ref 96–106)
Creatinine, Ser: 1 mg/dL (ref 0.57–1.00)
GFR calc Af Amer: 68 mL/min/{1.73_m2} (ref >59)
GFR calc non Af Amer: 59 mL/min/{1.73_m2} — ABNORMAL LOW (ref >59)
Globulin, Total: 2.5 g/dL (ref 1.5–4.5)
Glucose: 116 mg/dL — ABNORMAL HIGH (ref 65–99)
Potassium: 4.5 mmol/L (ref 3.5–5.2)
Sodium: 145 mmol/L — ABNORMAL HIGH (ref 134–144)
Total Protein: 7.5 g/dL (ref 6.0–8.5)

## 2019-05-09 LAB — LIPID PANEL
Chol/HDL Ratio: 3.3 ratio (ref 0.0–4.4)
Cholesterol, Total: 160 mg/dL (ref 100–199)
HDL: 49 mg/dL (ref >39)
LDL Chol Calc (NIH): 82 mg/dL (ref 0–99)
Triglycerides: 167 mg/dL — ABNORMAL HIGH (ref 0–149)
VLDL Cholesterol Cal: 29 mg/dL (ref 5–40)

## 2019-05-09 LAB — CBC
Hematocrit: 45.1 % (ref 34.0–46.6)
Hemoglobin: 14.4 g/dL (ref 11.1–15.9)
MCH: 24.9 pg — ABNORMAL LOW (ref 26.6–33.0)
MCHC: 31.9 g/dL (ref 31.5–35.7)
MCV: 78 fL — ABNORMAL LOW (ref 79–97)
Platelets: 216 10*3/uL (ref 150–450)
RBC: 5.78 x10E6/uL — ABNORMAL HIGH (ref 3.77–5.28)
RDW: 14.1 % (ref 11.7–15.4)
WBC: 8.8 10*3/uL (ref 3.4–10.8)

## 2019-05-16 ENCOUNTER — Ambulatory Visit: Payer: Medicare Other

## 2019-05-17 ENCOUNTER — Ambulatory Visit: Payer: Medicare Other

## 2019-05-22 ENCOUNTER — Ambulatory Visit: Payer: Medicare Other | Attending: Internal Medicine

## 2019-05-22 DIAGNOSIS — Z23 Encounter for immunization: Secondary | ICD-10-CM

## 2019-05-22 NOTE — Progress Notes (Signed)
   Covid-19 Vaccination Clinic  Name:  Tina [name]    MRN: [id number] DOB: March 21, 1955  05/22/2019  [name] was observed post Covid-19 immunization for 15 minutes without incident. She was provided with Vaccine Information Sheet and instruction to access the V-Safe system.   [name] was instructed to call 911 with any severe reactions post vaccine: Marland Kitchen Difficulty breathing  . Swelling of face and throat  . A fast heartbeat  . A bad rash all over body  . Dizziness and weakness   Immunizations Administered    Name Date Dose VIS Date Route   Pfizer COVID-19 Vaccine 05/22/2019  2:42 PM 0.3 mL 02/17/2019 Intramuscular   Manufacturer: [place]   Lot: [id number]   [place]: [place] COVID-19 Vaccine 05/22/2019  2:44 PM 0.3 mL 02/17/2019 Intramuscular   Manufacturer: [place]   Lot: [id number]   [place]: [id number]

## 2019-06-11 ENCOUNTER — Encounter: Payer: Self-pay | Admitting: Nurse Practitioner

## 2019-07-17 DIAGNOSIS — H25813 Combined forms of age-related cataract, bilateral: Secondary | ICD-10-CM | POA: Diagnosis not present

## 2019-07-31 ENCOUNTER — Ambulatory Visit (INDEPENDENT_AMBULATORY_CARE_PROVIDER_SITE_OTHER): Payer: Medicare Other | Admitting: Allergy and Immunology

## 2019-07-31 ENCOUNTER — Other Ambulatory Visit: Payer: Self-pay

## 2019-07-31 ENCOUNTER — Encounter: Payer: Self-pay | Admitting: Allergy and Immunology

## 2019-07-31 VITALS — BP 130/62 | HR 66 | Temp 97.9°F | Resp 16 | Wt 127.8 lb

## 2019-07-31 DIAGNOSIS — J4540 Moderate persistent asthma, uncomplicated: Secondary | ICD-10-CM

## 2019-07-31 DIAGNOSIS — H1013 Acute atopic conjunctivitis, bilateral: Secondary | ICD-10-CM | POA: Diagnosis not present

## 2019-07-31 DIAGNOSIS — J3089 Other allergic rhinitis: Secondary | ICD-10-CM

## 2019-07-31 MED ORDER — FLUTICASONE-SALMETEROL 100-50 MCG/DOSE IN AEPB: 1.0000 | INHALATION_SPRAY | Freq: Two times a day (BID) | RESPIRATORY_TRACT | 5 refills | Status: DC

## 2019-07-31 NOTE — Assessment & Plan Note (Signed)
   Treatment plan as outlined above for allergic rhinitis.  If needed, may use over-the-counter Pataday Extra Strength or Zaditor.  I have also recommended eye lubricant drops (Natural Tears) as needed.

## 2019-07-31 NOTE — Progress Notes (Signed)
Follow-up Note  RE: Tina [name] MRN: [id number] DOB: 09-16-1955 Date of Office Visit: 07/31/2019  Primary care provider: Gildardo Pounds, NP Referring provider: Gildardo Pounds, NP  History of present illness: Tina [name] is a 64 y.o. female with persistent asthma, allergic rhinitis, and gastroesophageal reflux presenting today for follow-up.  She was last seen in this clinic on May 01, 2019.  She is accompanied today by an interpreter who assists with the history.  She is currently taking Advair 100-50 g, 1 inhalation twice daily, and montelukast 10 mg daily at bedtime.  While on this regimen, she rarely requires albuterol rescue and denies limitations in normal daily activities or nocturnal awakenings due to lower respiratory symptoms.  She reports that her nasal allergy symptoms are currently well controlled with allergen avoidance measures and montelukast daily.  Assessment and plan: Moderate persistent asthma Currently well controlled   For now, continue Advair discus 100/50, 1 inhalation twice daily, montelukast 10 mg daily at bedtime, and albuterol HFA, 1 to 2 inhalations every 4-6 hours if needed.    A refill prescription has been provided for Advair.  If subjective and objective measures of pulmonary function remain stable, we will consider stepping down therapy on the next visit.  Seasonal and perennial allergic rhinitis  Medicated nasal sprays will not be used due to perceived side effects.  Continue appropriate allergen avoidance measures and montelukast daily.  Nasal saline spray (i.e. Simply Saline) is recommended as needed if tolerated.  Allergic conjunctivitis  Treatment plan as outlined above for allergic rhinitis.  If needed, may use over-the-counter Pataday Extra Strength or Zaditor.  I have also recommended eye lubricant drops (Natural Tears) as needed.   Meds ordered this encounter  Medications  . Fluticasone-Salmeterol (ADVAIR DISKUS) 100-50  MCG/DOSE AEPB    Sig: Inhale 1 puff into the lungs 2 (two) times daily.    Dispense:  1 each    Refill:  5    Diagnostics: Spirometry reveals an FVC of 2.29 L and an FEV1 of 1.84 L (79% data) with an FEV1 ratio of 109%.  FEV1 is improved today compared with previous study.  This study was performed while the patient was asymptomatic.  Please see scanned spirometry results for details.  Physical examination: Blood pressure 130/62, pulse 66, temperature 97.9 F (36.6 C), temperature source Temporal, resp. rate 16, weight 127 lb 12.8 oz (58 kg), SpO2 98 %.  General: Alert, interactive, in no acute distress. HEENT: TMs pearly gray, turbinates mildly edematous without discharge, post-pharynx unremarkable. Neck: Supple without lymphadenopathy. Lungs: Clear to auscultation without wheezing, rhonchi or rales. CV: Normal S1, S2 without murmurs. Skin: Warm and dry, without lesions or rashes.  The following portions of the patient's history were reviewed and updated as appropriate: allergies, current medications, past family history, past medical history, past social history, past surgical history and problem list.  Current Outpatient Medications  Medication Sig Dispense Refill  . albuterol (PROAIR HFA) 108 (90 Base) MCG/ACT inhaler Use 1-2 puffs every 4-6 hours as needed for cough, wheeze or shortness of breathe 18 g 1  . atorvastatin (LIPITOR) 20 MG tablet Take 1 tablet (20 mg total) by mouth daily. 90 tablet 3  . famotidine (PEPCID) 20 MG tablet Take 1 tablet (20 mg total) by mouth 2 (two) times daily. 60 tablet 5  . Fluticasone-Salmeterol (ADVAIR DISKUS) 100-50 MCG/DOSE AEPB Inhale 1 puff into the lungs 2 (two) times daily. 1 each 5  . Melatonin 5 MG CAPS Take  1 capsule by mouth daily.    Marland Kitchen MELATONIN PO Take 5 mg by mouth.    . montelukast (SINGULAIR) 10 MG tablet Take 1 tablet (10 mg total) by mouth at bedtime. 90 tablet 3  . Multiple Vitamins-Minerals (VITAMIN D3 COMPLETE PO) Take 1,000  Units by mouth daily.    Marland Kitchen VITAMIN D PO Take 2,000 Units by mouth.     No current facility-administered medications for this visit.    Allergies  Allergen Reactions  . Hctz [Hydrochlorothiazide] Shortness Of Breath  . Lisinopril Shortness Of Breath   Review of systems: Review of systems negative except as noted in HPI / PMHx.  Past Medical History:  Diagnosis Date  . Asthma   . Nephrolithiasis    "came out on it's own" (12/21/2012)  . Prediabetes     Family History  Problem Relation Age of Onset  . Tuberculosis Mother   . Asthma Mother   . Asthma Father   . Tuberculosis Father     Social History   Socioeconomic History  . Marital status: Married    Spouse name: Not on file  . Number of children: Not on file  . Years of education: Not on file  . Highest education level: Not on file  Occupational History  . Not on file  Tobacco Use  . Smoking status: Never Smoker  . Smokeless tobacco: Never Used  Substance and Sexual Activity  . Alcohol use: Not Currently  . Drug use: Never  . Sexual activity: Yes  Other Topics Concern  . Not on file  Social History Narrative   ** Merged History Encounter **       Social Determinants of Health   Financial Resource Strain:   . Difficulty of Paying Living Expenses:   Food Insecurity:   . Worried About Charity fundraiser in the Last Year:   . Arboriculturist in the Last Year:   Transportation Needs:   . Film/video editor (Medical):   Marland Kitchen Lack of Transportation (Non-Medical):   Physical Activity:   . Days of Exercise per Week:   . Minutes of Exercise per Session:   Stress:   . Feeling of Stress :   Social Connections:   . Frequency of Communication with Friends and Family:   . Frequency of Social Gatherings with Friends and Family:   . Attends Religious Services:   . Active Member of Clubs or Organizations:   . Attends Archivist Meetings:   Marland Kitchen Marital Status:   Intimate Partner Violence:   . Fear of  Current or Ex-Partner:   . Emotionally Abused:   Marland Kitchen Physically Abused:   . Sexually Abused:     I appreciate the opportunity to take part in Tina [name]'s care. Please do not hesitate to contact me with questions.  Sincerely,   R. Edgar Frisk, MD

## 2019-07-31 NOTE — Assessment & Plan Note (Signed)
Currently well controlled   For now, continue Advair discus 100/50, 1 inhalation twice daily, montelukast 10 mg daily at bedtime, and albuterol HFA, 1 to 2 inhalations every 4-6 hours if needed.    A refill prescription has been provided for Advair.  If subjective and objective measures of pulmonary function remain stable, we will consider stepping down therapy on the next visit.

## 2019-07-31 NOTE — Assessment & Plan Note (Signed)
   Medicated nasal sprays will not be used due to perceived side effects.  Continue appropriate allergen avoidance measures and montelukast daily.  Nasal saline spray (i.e. Simply Saline) is recommended as needed if tolerated.

## 2019-07-31 NOTE — Patient Instructions (Addendum)
Moderate persistent asthma Currently well controlled   For now, continue Advair discus 100/50, 1 inhalation twice daily, montelukast 10 mg daily at bedtime, and albuterol HFA, 1 to 2 inhalations every 4-6 hours if needed.    A refill prescription has been provided for Advair.  If subjective and objective measures of pulmonary function remain stable, we will consider stepping down therapy on the next visit.  Seasonal and perennial allergic rhinitis  Medicated nasal sprays will not be used due to perceived side effects.  Continue appropriate allergen avoidance measures and montelukast daily.  Nasal saline spray (i.e. Simply Saline) is recommended as needed if tolerated.  Allergic conjunctivitis  Treatment plan as outlined above for allergic rhinitis.  If needed, may use over-the-counter Pataday Extra Strength or Zaditor.  I have also recommended eye lubricant drops (Natural Tears) as needed.   Return in about 5 months (around 12/31/2019), or if symptoms worsen or fail to improve.

## 2019-08-10 ENCOUNTER — Telehealth: Payer: Self-pay | Admitting: Nurse Practitioner

## 2019-08-10 NOTE — Telephone Encounter (Signed)
Pt husband came to request a refill for atorvastatin (LIPITOR) 20 MG tablet Please send it to [facility] ([place] - [address]  [address] [place], [place] [phone]

## 2019-08-10 NOTE — Telephone Encounter (Signed)
Will route to PCP regarding refill request.

## 2019-08-12 ENCOUNTER — Other Ambulatory Visit: Payer: Self-pay | Admitting: Nurse Practitioner

## 2019-08-12 NOTE — Telephone Encounter (Signed)
Please follow up with pharmacy. She has a whole year of refills from 12-2018

## 2019-08-14 NOTE — Telephone Encounter (Signed)
Spoke to the pharmacy and stated she is too early, her next refill is on 06/15-2021. CMA spoke to patient and informed. Pt.'s husband was on the phone translating for patient.  Pt. Understood.

## 2019-11-22 ENCOUNTER — Encounter: Payer: Self-pay | Admitting: Nurse Practitioner

## 2019-11-22 ENCOUNTER — Other Ambulatory Visit: Payer: Self-pay

## 2019-11-22 ENCOUNTER — Ambulatory Visit: Payer: Medicare Other | Attending: Nurse Practitioner | Admitting: Nurse Practitioner

## 2019-11-22 VITALS — BP 131/70 | HR 71 | Temp 97.7°F | Ht 59.0 in | Wt 129.0 lb

## 2019-11-22 DIAGNOSIS — R7303 Prediabetes: Secondary | ICD-10-CM | POA: Diagnosis not present

## 2019-11-22 DIAGNOSIS — Z23 Encounter for immunization: Secondary | ICD-10-CM

## 2019-11-22 DIAGNOSIS — J4540 Moderate persistent asthma, uncomplicated: Secondary | ICD-10-CM

## 2019-11-22 DIAGNOSIS — E785 Hyperlipidemia, unspecified: Secondary | ICD-10-CM

## 2019-11-22 DIAGNOSIS — R7989 Other specified abnormal findings of blood chemistry: Secondary | ICD-10-CM | POA: Diagnosis not present

## 2019-11-22 LAB — POCT GLYCOSYLATED HEMOGLOBIN (HGB A1C): Hemoglobin A1C: 6.2 % — AB (ref 4.0–5.6)

## 2019-11-22 LAB — GLUCOSE, POCT (MANUAL RESULT ENTRY): POC Glucose: 133 mg/dl — AB (ref 70–99)

## 2019-11-22 MED ORDER — ALBUTEROL SULFATE HFA 108 (90 BASE) MCG/ACT IN AERS: INHALATION_SPRAY | RESPIRATORY_TRACT | 1 refills | Status: DC

## 2019-11-22 MED ORDER — MONTELUKAST SODIUM 10 MG PO TABS: 10.0000 mg | ORAL_TABLET | Freq: Every day | ORAL | 3 refills | Status: DC

## 2019-11-22 MED ORDER — ATORVASTATIN CALCIUM 20 MG PO TABS: 20.0000 mg | ORAL_TABLET | Freq: Every day | ORAL | 3 refills | Status: DC

## 2019-11-22 NOTE — Progress Notes (Signed)
Assessment & Plan:  Tina [name] was seen today for headache.  Diagnoses and all orders for this visit:  Prediabetes -     Glucose (CBG) -     HgB A1c -     CMP14+EGFR Continue blood sugar control as discussed in office today, low carbohydrate diet, and regular physical exercise as tolerated, 150 minutes per week (30 min each day, 5 days per week, or 50 min 3 days per week).   Moderate persistent asthma without complication -     montelukast (SINGULAIR) 10 MG tablet; Take 1 tablet (10 mg total) by mouth at bedtime. -     albuterol (PROAIR HFA) 108 (90 Base) MCG/ACT inhaler; Use 1-2 puffs every 4-6 hours as needed for cough, wheeze or shortness of breathe  Dyslipidemia, goal LDL below 100 -     atorvastatin (LIPITOR) 20 MG tablet; Take 1 tablet (20 mg total) by mouth daily. -     Lipid panel Lab Results  Component Value Date   LDLCALC 81 11/22/2019  INSTRUCTIONS: Work on a low fat, heart healthy diet and participate in regular aerobic exercise program by working out at least 150 minutes per week; 5 days a week-30 minutes per day. Avoid red meat/beef/steak,  fried foods. junk foods, sodas, sugary drinks, unhealthy snacking, alcohol and smoking.  Drink at least 80 oz of water per day and monitor your carbohydrate intake daily.   Abnormal CBC -     CBC  Need for influenza vaccination -     Flu Vaccine QUAD 6+ mos PF IM (Fluarix Quad PF)    Patient has been counseled on age-appropriate routine health concerns for screening and prevention. These are reviewed and up-to-date. Referrals have been placed accordingly. Immunizations are up-to-date or declined.    Subjective:   Chief Complaint  Patient presents with   Headache    Pt. stated sometime she get headaches.    HPI Tina [name] 64 y.o. female presents to office today for follow up. She has an Sports administrator interpreter with her today.   has a past medical history of Asthma, Nephrolithiasis, and Prediabetes.  Patient has been  counseled on age-appropriate routine health concerns for screening and prevention. These are reviewed and up-to-date. Referrals have been placed accordingly. Immunizations are up-to-date or declined.    Declines mammogram, colonoscopy and ophthalmology referral   Prediabetes Well controlled without the use of oral diabetic agents.  Lab Results  Component Value Date   HGBA1C 6.2 (A) 11/22/2019   Asthma Symptoms well controlled with advair diskus 1 puff BID, prn albuterol and singulair 10 mg daily. She denies cough, wheezing or shortness of breath.     Review of Systems  Constitutional: Negative for fever, malaise/fatigue and weight loss.  HENT: Negative.  Negative for nosebleeds.   Eyes: Negative.  Negative for blurred vision, double vision and photophobia.  Respiratory: Negative.  Negative for cough and shortness of breath.   Cardiovascular: Negative.  Negative for chest pain, palpitations and leg swelling.  Gastrointestinal: Negative.  Negative for heartburn, nausea and vomiting.  Musculoskeletal: Negative.  Negative for myalgias.  Neurological: Positive for headaches (intermittent and controlled ). Negative for dizziness, focal weakness and seizures.  Psychiatric/Behavioral: Negative.  Negative for suicidal ideas.    Past Medical History:  Diagnosis Date   Asthma    Nephrolithiasis    "came out on it's own" (12/21/2012)   Prediabetes     Past Surgical History:  Procedure Laterality Date   CHOLECYSTECTOMY N/A 12/21/2012  Procedure: LAPAROSCOPIC CHOLECYSTECTOMY;  Surgeon: Ralene Ok, MD;  Location: [facility];  Service: General;  Laterality: N/A;   KIDNEY STONE SURGERY     spouse denies this hx on 12/21/2012   LAPAROSCOPIC CHOLECYSTECTOMY  12/21/2012    Family History  Problem Relation Age of Onset   Tuberculosis Mother    Asthma Mother    Asthma Father    Tuberculosis Father     Social History Reviewed with no changes to be made today.   Outpatient  Medications Prior to Visit  Medication Sig Dispense Refill   famotidine (PEPCID) 20 MG tablet Take 1 tablet (20 mg total) by mouth 2 (two) times daily. 60 tablet 5   Fluticasone-Salmeterol (ADVAIR DISKUS) 100-50 MCG/DOSE AEPB Inhale 1 puff into the lungs 2 (two) times daily. 1 each 5   Melatonin 5 MG CAPS Take 1 capsule by mouth daily.     MELATONIN PO Take 5 mg by mouth.     Multiple Vitamins-Minerals (VITAMIN D3 COMPLETE PO) Take 1,000 Units by mouth daily.     VITAMIN D PO Take 2,000 Units by mouth.     albuterol (PROAIR HFA) 108 (90 Base) MCG/ACT inhaler Use 1-2 puffs every 4-6 hours as needed for cough, wheeze or shortness of breathe 18 g 1   atorvastatin (LIPITOR) 20 MG tablet Take 1 tablet (20 mg total) by mouth daily. 90 tablet 3   montelukast (SINGULAIR) 10 MG tablet Take 1 tablet (10 mg total) by mouth at bedtime. 90 tablet 3   No facility-administered medications prior to visit.    Allergies  Allergen Reactions   Hctz [Hydrochlorothiazide] Shortness Of Breath   Lisinopril Shortness Of Breath       Objective:    BP 131/70 (BP Location: Left Arm, Patient Position: Sitting, Cuff Size: Normal)    Pulse 71    Temp 97.7 F (36.5 C) (Temporal)    Ht 4' 11" (1.499 m)    Wt 129 lb (58.5 kg)    SpO2 96%    BMI 26.05 kg/m  Wt Readings from Last 3 Encounters:  11/22/19 129 lb (58.5 kg)  07/31/19 127 lb 12.8 oz (58 kg)  05/08/19 132 lb (59.9 kg)    Physical Exam Vitals and nursing note reviewed.  Constitutional:      Appearance: She is well-developed.  HENT:     Head: Normocephalic and atraumatic.  Cardiovascular:     Rate and Rhythm: Normal rate and regular rhythm.     Heart sounds: Normal heart sounds. No murmur heard.  No friction rub. No gallop.   Pulmonary:     Effort: Pulmonary effort is normal. No tachypnea or respiratory distress.     Breath sounds: Normal breath sounds. No decreased breath sounds, wheezing, rhonchi or rales.  Chest:     Chest wall: No  tenderness.  Abdominal:     General: Bowel sounds are normal.     Palpations: Abdomen is soft.  Musculoskeletal:        General: Normal range of motion.     Cervical back: Normal range of motion.  Skin:    General: Skin is warm and dry.  Neurological:     Mental Status: She is alert and oriented to person, place, and time.     Coordination: Coordination normal.  Psychiatric:        Behavior: Behavior normal. Behavior is cooperative.        Thought Content: Thought content normal.        Judgment: Judgment  normal.          Patient has been counseled extensively about nutrition and exercise as well as the importance of adherence with medications and regular follow-up. The patient was given clear instructions to go to ER or return to medical center if symptoms don't improve, worsen or new problems develop. The patient verbalized understanding.   Follow-up: Return in about 6 months (around 05/21/2020).   Gildardo Pounds, FNP-BC [facility] and [facility] [place], [place]   11/25/2019, 11:31 AM

## 2019-11-23 LAB — CMP14+EGFR
ALT: 28 IU/L (ref 0–32)
AST: 22 IU/L (ref 0–40)
Albumin/Globulin Ratio: 1.7 (ref 1.2–2.2)
Albumin: 4.8 g/dL (ref 3.8–4.8)
Alkaline Phosphatase: 74 IU/L (ref 44–121)
BUN/Creatinine Ratio: 17 (ref 12–28)
BUN: 14 mg/dL (ref 8–27)
Bilirubin Total: 0.4 mg/dL (ref 0.0–1.2)
CO2: 25 mmol/L (ref 20–29)
Calcium: 9.5 mg/dL (ref 8.7–10.3)
Chloride: 104 mmol/L (ref 96–106)
Creatinine, Ser: 0.81 mg/dL (ref 0.57–1.00)
GFR calc Af Amer: 88 mL/min/{1.73_m2} (ref >59)
GFR calc non Af Amer: 76 mL/min/{1.73_m2} (ref >59)
Globulin, Total: 2.9 g/dL (ref 1.5–4.5)
Glucose: 107 mg/dL — ABNORMAL HIGH (ref 65–99)
Potassium: 3.9 mmol/L (ref 3.5–5.2)
Sodium: 141 mmol/L (ref 134–144)
Total Protein: 7.7 g/dL (ref 6.0–8.5)

## 2019-11-23 LAB — CBC
Hematocrit: 43.8 % (ref 34.0–46.6)
Hemoglobin: 13.5 g/dL (ref 11.1–15.9)
MCH: 24.2 pg — ABNORMAL LOW (ref 26.6–33.0)
MCHC: 30.8 g/dL — ABNORMAL LOW (ref 31.5–35.7)
MCV: 78 fL — ABNORMAL LOW (ref 79–97)
Platelets: 219 10*3/uL (ref 150–450)
RBC: 5.59 x10E6/uL — ABNORMAL HIGH (ref 3.77–5.28)
RDW: 14 % (ref 11.7–15.4)
WBC: 8 10*3/uL (ref 3.4–10.8)

## 2019-11-23 LAB — LIPID PANEL
Chol/HDL Ratio: 4.3 ratio (ref 0.0–4.4)
Cholesterol, Total: 172 mg/dL (ref 100–199)
HDL: 40 mg/dL (ref >39)
LDL Chol Calc (NIH): 81 mg/dL (ref 0–99)
Triglycerides: 311 mg/dL — ABNORMAL HIGH (ref 0–149)
VLDL Cholesterol Cal: 51 mg/dL — ABNORMAL HIGH (ref 5–40)

## 2019-11-25 ENCOUNTER — Encounter: Payer: Self-pay | Admitting: Nurse Practitioner

## 2020-01-01 ENCOUNTER — Other Ambulatory Visit: Payer: Self-pay

## 2020-01-01 ENCOUNTER — Ambulatory Visit (INDEPENDENT_AMBULATORY_CARE_PROVIDER_SITE_OTHER): Payer: Medicare Other | Admitting: Allergy

## 2020-01-01 ENCOUNTER — Encounter: Payer: Self-pay | Admitting: Allergy

## 2020-01-01 VITALS — BP 130/82 | HR 68 | Temp 99.4°F | Resp 14 | Ht <= 58 in | Wt 129.0 lb

## 2020-01-01 DIAGNOSIS — J302 Other seasonal allergic rhinitis: Secondary | ICD-10-CM

## 2020-01-01 DIAGNOSIS — J4540 Moderate persistent asthma, uncomplicated: Secondary | ICD-10-CM | POA: Diagnosis not present

## 2020-01-01 DIAGNOSIS — J3089 Other allergic rhinitis: Secondary | ICD-10-CM | POA: Diagnosis not present

## 2020-01-01 DIAGNOSIS — H1010 Acute atopic conjunctivitis, unspecified eye: Secondary | ICD-10-CM | POA: Diagnosis not present

## 2020-01-01 MED ORDER — FLUTICASONE-SALMETEROL 100-50 MCG/DOSE IN AEPB: 1.0000 | INHALATION_SPRAY | Freq: Two times a day (BID) | RESPIRATORY_TRACT | 5 refills | Status: DC

## 2020-01-01 MED ORDER — MONTELUKAST SODIUM 10 MG PO TABS: 10.0000 mg | ORAL_TABLET | Freq: Every day | ORAL | 3 refills | Status: DC

## 2020-01-01 NOTE — Patient Instructions (Addendum)
Asthma: . Breathing test not interpretable.  . Daily controller medication(s): continue Advair 157mcg 1 puff twice a day and rinse mouth after each use.  o Continue montelukast 10mg  daily at night.  . May use albuterol rescue inhaler 2 puffs every 4 to 6 hours as needed for shortness of breath, chest tightness, coughing, and wheezing. May use albuterol rescue inhaler 2 puffs 5 to 15 minutes prior to strenuous physical activities. Monitor frequency of use.  . Asthma control goals:  o Full participation in all desired activities (may need albuterol before activity) o Albuterol use two times or less a week on average (not counting use with activity) o Cough interfering with sleep two times or less a month o Oral steroids no more than once a year o No hospitalizations  Allergic rhino conjunctivitis:  2018 skin testing was positive to tree, mold, dust mite.  Continue environmental control measures.  May use over the counter antihistamines such as Zyrtec (cetirizine), Claritin (loratadine), Allegra (fexofenadine), or Xyzal (levocetirizine) daily as needed.  Nasal saline spray (i.e., Simply Saline) is recommended as needed.  May use over the counter olopatadine eye drops 0.2% once a day as needed for itchy/watery eyes.  Follow up in 6 months or sooner if needed.   Reducing Pollen Exposure . Pollen seasons: trees (spring), grass (summer) and ragweed/weeds (fall). Marland Kitchen Keep windows closed in your home and car to lower pollen exposure.  Susa Simmonds air conditioning in the bedroom and throughout the house if possible.  . Avoid going out in dry windy days - especially early morning. . Pollen counts are highest between 5 - 10 AM and on dry, hot and windy days.  . Save outside activities for late afternoon or after a heavy rain, when pollen levels are lower.  . Avoid mowing of grass if you have grass pollen allergy. Marland Kitchen Be aware that pollen can also be transported indoors on people and pets.  . Dry  your clothes in an automatic dryer rather than hanging them outside where they might collect pollen.  . Rinse hair and eyes before bedtime. Mold Control . Mold and fungi can grow on a variety of surfaces provided certain temperature and moisture conditions exist.  . Outdoor molds grow on plants, decaying vegetation and soil. The major outdoor mold, Alternaria and Cladosporium, are found in very high numbers during hot and dry conditions. Generally, a late summer - fall peak is seen for common outdoor fungal spores. Rain will temporarily lower outdoor mold spore count, but counts rise rapidly when the rainy period ends. . The most important indoor molds are Aspergillus and Penicillium. Dark, humid and poorly ventilated basements are ideal sites for mold growth. The next most common sites of mold growth are the bathroom and the kitchen. Outdoor (Seasonal) Mold Control . Use air conditioning and keep windows closed. . Avoid exposure to decaying vegetation. Marland Kitchen Avoid leaf raking. . Avoid grain handling. . Consider wearing a face mask if working in moldy areas.  Indoor (Perennial) Mold Control  . Maintain humidity below 50%. . Get rid of mold growth on hard surfaces with water, detergent and, if necessary, 5% bleach (do not mix with other cleaners). Then dry the area completely. If mold covers an area more than 10 square feet, consider hiring an indoor environmental professional. . For clothing, washing with soap and water is best. If moldy items cannot be cleaned and dried, throw them away. . Remove sources e.g. contaminated carpets. . Repair and seal leaking roofs  or pipes. Using dehumidifiers in damp basements may be helpful, but empty the water and clean units regularly to prevent mildew from forming. All rooms, especially basements, bathrooms and kitchens, require ventilation and cleaning to deter mold and mildew growth. Avoid carpeting on concrete or damp floors, and storing items in damp  areas. Control of House Dust Mite Allergen . Dust mite allergens are a common trigger of allergy and asthma symptoms. While they can be found throughout the house, these microscopic creatures thrive in warm, humid environments such as bedding, upholstered furniture and carpeting. . Because so much time is spent in the bedroom, it is essential to reduce mite levels there.  . Encase pillows, mattresses, and box springs in special allergen-proof fabric covers or airtight, zippered plastic covers.  . Bedding should be washed weekly in hot water (130 F) and dried in a hot dryer. Allergen-proof covers are available for comforters and pillows that can't be regularly washed.  Wendee Copp the allergy-proof covers every few months. Minimize clutter in the bedroom. Keep pets out of the bedroom.  Marland Kitchen Keep humidity less than 50% by using a dehumidifier or air conditioning. You can buy a humidity measuring device called a hygrometer to monitor this.  . If possible, replace carpets with hardwood, linoleum, or washable area rugs. If that's not possible, vacuum frequently with a vacuum that has a HEPA filter. . Remove all upholstered furniture and non-washable window drapes from the bedroom. . Remove all non-washable stuffed toys from the bedroom.  Wash stuffed toys weekly.

## 2020-01-01 NOTE — Assessment & Plan Note (Addendum)
Past history - 2018 skin testing was positive to tree, mold, dust mite. Medicated nasal sprays will not be used due to perceived side effects. Interim history - stable with below regimen.  Not needed to use any nasal sprays.   Continue environmental control measures.  May use over the counter antihistamines such as Zyrtec (cetirizine), Claritin (loratadine), Allegra (fexofenadine), or Xyzal (levocetirizine) daily as needed.  Nasal saline spray (i.e., Simply Saline) is recommended as needed.  May use over the counter olopatadine eye drops 0.2% once a day as needed for itchy/watery eyes.

## 2020-01-01 NOTE — Progress Notes (Signed)
Follow Up Note  RE: Tina [name] MRN: [id number] DOB: Aug 06, 1955 Date of Office Visit: 01/01/2020  Referring provider: Gildardo Pounds, NP Primary care provider: Gildardo Pounds, NP  Chief Complaint: Asthma (Says it has improved.)  History of Present Illness: I had the pleasure of seeing Tina [name] for a follow up visit at the [facility] on 01/01/2020. She is a 64 y.o. female, who is being followed for asthma and allergic rhinoconjunctivitis. Her previous allergy office visit was on 07/31/2019 with Dr. Verlin Fester. Today is a regular follow up visit. Translator present in person.   Moderate persistent asthma Currently on Advair 100cmg 1 puff twice a day and rinsing mouth after each use and montelukast 10mg  daily at night. Not needed to use albuterol.  Denies any SOB, coughing, wheezing, chest tightness, nocturnal awakenings, ER/urgent care visits or prednisone use since the last visit.  Seasonal and perennial allergic rhino conjunctivitis 2018 skin testing was positive to tree, mold, dust mite. Not needed to use any nasal spray. Using some type of over the counter eye drops with some benefit.  Assessment and Plan: Tina [name] is a 64 y.o. female with: Moderate persistent asthma without complication Stable with below regimen. . Attempted to do spirometry but patient could not understand technique.  Spirometry was not interpretable. . Daily controller medication(s): continue Advair 171mcg 1 puff twice a day and rinse mouth after each use.  o Continue montelukast 10mg  daily at night.  . May use albuterol rescue inhaler 2 puffs every 4 to 6 hours as needed for shortness of breath, chest tightness, coughing, and wheezing. May use albuterol rescue inhaler 2 puffs 5 to 15 minutes prior to strenuous physical activities. Monitor frequency of use.  . Repeat spirometry at next visit - if doing well will step down therapy.   Seasonal and perennial allergic rhinoconjunctivitis Past  history - 2018 skin testing was positive to tree, mold, dust mite. Medicated nasal sprays will not be used due to perceived side effects. Interim history - stable with below regimen.  Not needed to use any nasal sprays.   Continue environmental control measures.  May use over the counter antihistamines such as Zyrtec (cetirizine), Claritin (loratadine), Allegra (fexofenadine), or Xyzal (levocetirizine) daily as needed.  Nasal saline spray (i.e., Simply Saline) is recommended as needed.  May use over the counter olopatadine eye drops 0.2% once a day as needed for itchy/watery eyes.  Return in about 6 months (around 07/01/2020).  Meds ordered this encounter  Medications  . Fluticasone-Salmeterol (ADVAIR DISKUS) 100-50 MCG/DOSE AEPB    Sig: Inhale 1 puff into the lungs 2 (two) times daily. Rinse mouth after each use.    Dispense:  1 each    Refill:  5  . montelukast (SINGULAIR) 10 MG tablet    Sig: Take 1 tablet (10 mg total) by mouth at bedtime.    Dispense:  90 tablet    Refill:  3   Diagnostics: Spirometry:  Attempted spirometry but patient had difficulty understanding the technique.   Medication List:  Current Outpatient Medications  Medication Sig Dispense Refill  . albuterol (PROAIR HFA) 108 (90 Base) MCG/ACT inhaler Use 1-2 puffs every 4-6 hours as needed for cough, wheeze or shortness of breathe 18 g 1  . atorvastatin (LIPITOR) 20 MG tablet Take 1 tablet (20 mg total) by mouth daily. 90 tablet 3  . famotidine (PEPCID) 20 MG tablet Take 1 tablet (20 mg total) by mouth 2 (two) times daily. [place]  tablet 5  . Fluticasone-Salmeterol (ADVAIR DISKUS) 100-50 MCG/DOSE AEPB Inhale 1 puff into the lungs 2 (two) times daily. Rinse mouth after each use. 1 each 5  . Melatonin 5 MG CAPS Take 1 capsule by mouth daily.    Marland Kitchen MELATONIN PO Take 5 mg by mouth.    . montelukast (SINGULAIR) 10 MG tablet Take 1 tablet (10 mg total) by mouth at bedtime. 90 tablet 3  . Multiple Vitamins-Minerals  (VITAMIN D3 COMPLETE PO) Take 1,000 Units by mouth daily.    Marland Kitchen VITAMIN D PO Take 2,000 Units by mouth.     No current facility-administered medications for this visit.   Allergies: Allergies  Allergen Reactions  . Hctz [Hydrochlorothiazide] Shortness Of Breath  . Lisinopril Shortness Of Breath   I reviewed her past medical history, social history, family history, and environmental history and no significant changes have been reported from her previous visit.  Review of Systems  Constitutional: Negative for appetite change, chills, fever and unexpected weight change.  HENT: Negative for congestion and rhinorrhea.   Eyes: Negative for itching.  Respiratory: Negative for cough, chest tightness, shortness of breath and wheezing.   Gastrointestinal: Negative for abdominal pain.  Skin: Negative for rash.  Allergic/Immunologic: Positive for environmental allergies.  Neurological: Negative for headaches.   Objective: BP 130/82   Pulse 68   Temp 99.4 F (37.4 C)   Resp 14   Ht 4' 8.75" (1.441 m)   Wt 129 lb (58.5 kg)   SpO2 98%   BMI 28.16 kg/m  Body mass index is 28.16 kg/m. Physical Exam Vitals and nursing note reviewed.  Constitutional:      Appearance: Normal appearance. She is well-developed.  HENT:     Head: Normocephalic and atraumatic.     Right Ear: External ear normal.     Left Ear: External ear normal.     Nose: Nose normal.     Mouth/Throat:     Mouth: Mucous membranes are moist.     Pharynx: Oropharynx is clear.  Eyes:     Conjunctiva/sclera: Conjunctivae normal.  Cardiovascular:     Rate and Rhythm: Normal rate and regular rhythm.     Heart sounds: Normal heart sounds. No murmur heard.   Pulmonary:     Effort: Pulmonary effort is normal.     Breath sounds: Normal breath sounds. No wheezing, rhonchi or rales.  Musculoskeletal:     Cervical back: Neck supple.  Skin:    General: Skin is warm.     Findings: No rash.  Neurological:     Mental Status: She  is alert and oriented to person, place, and time.  Psychiatric:        Behavior: Behavior normal.    Previous notes and tests were reviewed. The plan was reviewed with the patient/family, and all questions/concerned were addressed.  It was my pleasure to see Brooke today and participate in her care. Please feel free to contact me with any questions or concerns.  Sincerely,  Rexene Alberts, DO Allergy & Immunology  [facility] office: [place] office: [phone]

## 2020-01-01 NOTE — Assessment & Plan Note (Addendum)
Stable with below regimen. . Attempted to do spirometry but patient could not understand technique.  Spirometry was not interpretable. . Daily controller medication(s): continue Advair 161mcg 1 puff twice a day and rinse mouth after each use.  o Continue montelukast 10mg  daily at night.  . May use albuterol rescue inhaler 2 puffs every 4 to 6 hours as needed for shortness of breath, chest tightness, coughing, and wheezing. May use albuterol rescue inhaler 2 puffs 5 to 15 minutes prior to strenuous physical activities. Monitor frequency of use.  . Repeat spirometry at next visit - if doing well will step down therapy.

## 2020-02-23 ENCOUNTER — Other Ambulatory Visit: Payer: Self-pay

## 2020-02-23 ENCOUNTER — Ambulatory Visit: Payer: Medicare Other | Attending: Internal Medicine | Admitting: Internal Medicine

## 2020-02-23 ENCOUNTER — Encounter: Payer: Self-pay | Admitting: Internal Medicine

## 2020-02-23 VITALS — BP 130/65 | HR 86 | Temp 97.5°F | Resp 16 | Wt 128.4 lb

## 2020-02-23 DIAGNOSIS — R21 Rash and other nonspecific skin eruption: Secondary | ICD-10-CM

## 2020-02-23 MED ORDER — DIPHENHYDRAMINE HCL 25 MG PO TABS
25.0000 mg | ORAL_TABLET | Freq: Four times a day (QID) | ORAL | 0 refills | Status: DC | PRN
Start: 2020-02-23 — End: 2020-07-18

## 2020-02-23 MED ORDER — PREDNISONE 20 MG PO TABS: 40.0000 mg | ORAL_TABLET | Freq: Every day | ORAL | 0 refills | Status: DC

## 2020-02-23 NOTE — Progress Notes (Signed)
Patient ID: Tina [name], female    DOB: 1956/02/16  MRN: [id number]  CC: Rash   Subjective: Tina [name] is a 64 y.o. female who presents for UC visit.  Adah Perl, from [employer] is with her and interprets. Her concerns today include:  Patient with history of prediabetes, moderate persistent asthma, HL  Pt c/o itching and rash x 5 days.  Rash involves neck, trunk, extremities. No problems breathing.  No known food allergies.  No changes in body products; no new plants or animals in the house.  She does not work Husband does not have rash She has been using Eucerine Healing Cream OTC and OTC Zyrtec without relief Patient Active Problem List   Diagnosis Date Noted  . Headache and arm pain 07/25/2018  . Medication refill 02/08/2017  . Seasonal and perennial allergic rhinitis 11/30/2016  . Seasonal and perennial allergic rhinoconjunctivitis 11/30/2016  . Prediabetes 09/28/2016  . Essential hypertension 12/09/2015  . Gastroesophageal reflux disease 12/09/2015  . Constipation 07/08/2015  . Moderate persistent asthma without complication 38/25/0539  . Chronic headache 02/22/2014     Current Outpatient Medications on File Prior to Visit  Medication Sig Dispense Refill  . albuterol (PROAIR HFA) 108 (90 Base) MCG/ACT inhaler Use 1-2 puffs every 4-6 hours as needed for cough, wheeze or shortness of breathe 18 g 1  . atorvastatin (LIPITOR) 20 MG tablet Take 1 tablet (20 mg total) by mouth daily. 90 tablet 3  . famotidine (PEPCID) 20 MG tablet Take 1 tablet (20 mg total) by mouth 2 (two) times daily. 60 tablet 5  . Fluticasone-Salmeterol (ADVAIR DISKUS) 100-50 MCG/DOSE AEPB Inhale 1 puff into the lungs 2 (two) times daily. Rinse mouth after each use. 1 each 5  . Melatonin 5 MG CAPS Take 1 capsule by mouth daily.    Marland Kitchen MELATONIN PO Take 5 mg by mouth.    . montelukast (SINGULAIR) 10 MG tablet Take 1 tablet (10 mg total) by mouth at bedtime. 90 tablet 3  . Multiple Vitamins-Minerals  (VITAMIN D3 COMPLETE PO) Take 1,000 Units by mouth daily.    Marland Kitchen VITAMIN D PO Take 2,000 Units by mouth.     No current facility-administered medications on file prior to visit.    Allergies  Allergen Reactions  . Hctz [Hydrochlorothiazide] Shortness Of Breath  . Lisinopril Shortness Of Breath    Social History   Socioeconomic History  . Marital status: Married    Spouse name: Not on file  . Number of children: Not on file  . Years of education: Not on file  . Highest education level: Not on file  Occupational History  . Not on file  Tobacco Use  . Smoking status: Never Smoker  . Smokeless tobacco: Never Used  Vaping Use  . Vaping Use: Never used  Substance and Sexual Activity  . Alcohol use: Not Currently  . Drug use: Never  . Sexual activity: Yes  Other Topics Concern  . Not on file  Social History Narrative   ** Merged History Encounter **       Social Determinants of Health   Financial Resource Strain: Not on file  Food Insecurity: Not on file  Transportation Needs: Not on file  Physical Activity: Not on file  Stress: Not on file  Social Connections: Not on file  Intimate Partner Violence: Not on file    Family History  Problem Relation Age of Onset  . Tuberculosis Mother   . Asthma Mother   .  Asthma Father   . Tuberculosis Father     Past Surgical History:  Procedure Laterality Date  . CHOLECYSTECTOMY N/A 12/21/2012   Procedure: LAPAROSCOPIC CHOLECYSTECTOMY;  Surgeon: Ralene Ok, MD;  Location: [facility];  Service: General;  Laterality: N/A;  . KIDNEY STONE SURGERY     spouse denies this hx on 12/21/2012  . LAPAROSCOPIC CHOLECYSTECTOMY  12/21/2012    ROS: Review of Systems Negative except as stated above  PHYSICAL EXAM: BP 130/65   Pulse 86   Temp (!) 97.5 F (36.4 C)   Resp 16   Wt 128 lb 6.4 oz (58.2 kg)   SpO2 97%   BMI 28.03 kg/m   Physical Exam  General appearance - alert, well appearing, and in no distress Mental status -  normal mood, behavior, speech, dress, motor activity, and thought processes Chest -few scattered wheezes Heart - normal rate, regular rhythm, normal S1, S2, no murmurs, rubs, clicks or gallops Skin -patient with mild extensive erythema and excoriation of the posterior thorax.  Patches of slightly erythematous area with excoriation from scratching also noted around the neck anteriorly and posteriorly.  3 larger slightly raised plaque-like erythematous area noted on the left shin; largest of which is about 5 cm in greatest diameter.  Scattered areas on the other extremities  CMP Latest Ref Rng & Units 11/22/2019 05/08/2019 01/11/2018  Glucose 65 - 99 mg/dL 107(H) 116(H) 105(H)  BUN 8 - 27 mg/dL 14 14 13   Creatinine 0.57 - 1.00 mg/dL 0.81 1.00 0.84  Sodium 134 - 144 mmol/L 141 145(H) 141  Potassium 3.5 - 5.2 mmol/L 3.9 4.5 4.0  Chloride 96 - 106 mmol/L 104 106 102  CO2 20 - 29 mmol/L 25 20 23   Calcium 8.7 - 10.3 mg/dL 9.5 10.1 9.8  Total Protein 6.0 - 8.5 g/dL 7.7 7.5 7.8  Total Bilirubin 0.0 - 1.2 mg/dL 0.4 0.3 0.5  Alkaline Phos 44 - 121 IU/L 74 73 64  AST 0 - 40 IU/L 22 14 25   ALT 0 - 32 IU/L 28 24 33(H)   Lipid Panel     Component Value Date/Time   CHOL 172 11/22/2019 1540   TRIG 311 (H) 11/22/2019 1540   HDL 40 11/22/2019 1540   CHOLHDL 4.3 11/22/2019 1540   CHOLHDL 5.7 (H) 05/26/2016 1044   VLDL 43 (H) 05/26/2016 1044   LDLCALC 81 11/22/2019 1540    CBC    Component Value Date/Time   WBC 8.0 11/22/2019 1540   WBC 7.0 05/26/2016 1044   RBC 5.59 (H) 11/22/2019 1540   RBC 5.59 (H) 05/26/2016 1044   HGB 13.5 11/22/2019 1540   HCT 43.8 11/22/2019 1540   PLT 219 11/22/2019 1540   MCV 78 (L) 11/22/2019 1540   MCH 24.2 (L) 11/22/2019 1540   MCH 25.0 (L) 05/26/2016 1044   MCHC 30.8 (L) 11/22/2019 1540   MCHC 32.9 05/26/2016 1044   RDW 14.0 11/22/2019 1540   LYMPHSABS 2,590 05/26/2016 1044   MONOABS 280 05/26/2016 1044   EOSABS 770 (H) 05/26/2016 1044   BASOSABS 0 05/26/2016  1044    ASSESSMENT AND PLAN: 1. Rash and nonspecific skin eruption Patient with itchy dermatitis.  Etiology is questionable.  Given that it is extensive, I will prescribe oral steroid rather than steroid cream.  I have placed her on prednisone 40 mg daily for the next 6 days. Told to stop Zyrtec.  Will use Benadryl instead.  Advised patient that she can take the Benadryl every 6 hours  as needed.  Informed that Benadryl can cause drowsiness.  Advised not to drive or operate any machinery when she takes Benadryl. Follow-up with PCP if no improvement. - predniSONE (DELTASONE) 20 MG tablet; Take 2 tablets (40 mg total) by mouth daily with breakfast.  Dispense: 12 tablet; Refill: 0 - diphenhydrAMINE (BENADRYL ALLERGY) 25 MG tablet; Take 1 tablet (25 mg total) by mouth every 6 (six) hours as needed for itching.  Dispense: 30 tablet; Refill: 0 - Comprehensive metabolic panel - CBC    Patient was given the opportunity to ask questions.  Patient verbalized understanding of the plan and was able to repeat key elements of the plan.   Orders Placed This Encounter  Procedures  . Comprehensive metabolic panel  . CBC     Requested Prescriptions   Signed Prescriptions Disp Refills  . predniSONE (DELTASONE) 20 MG tablet 12 tablet 0    Sig: Take 2 tablets (40 mg total) by mouth daily with breakfast.  . diphenhydrAMINE (BENADRYL ALLERGY) 25 MG tablet 30 tablet 0    Sig: Take 1 tablet (25 mg total) by mouth every 6 (six) hours as needed for itching.    Return if symptoms worsen or fail to improve.  Karle Plumber, MD, FACP

## 2020-02-23 NOTE — Progress Notes (Signed)
Pt states her skin is hurting from the scratching

## 2020-02-23 NOTE — Patient Instructions (Signed)
Hold of on taking Zyrtec.

## 2020-02-24 LAB — CBC
Hematocrit: 44.3 % (ref 34.0–46.6)
Hemoglobin: 14.3 g/dL (ref 11.1–15.9)
MCH: 25.2 pg — ABNORMAL LOW (ref 26.6–33.0)
MCHC: 32.3 g/dL (ref 31.5–35.7)
MCV: 78 fL — ABNORMAL LOW (ref 79–97)
Platelets: 219 10*3/uL (ref 150–450)
RBC: 5.67 x10E6/uL — ABNORMAL HIGH (ref 3.77–5.28)
RDW: 14.2 % (ref 11.7–15.4)
WBC: 8 10*3/uL (ref 3.4–10.8)

## 2020-02-24 LAB — COMPREHENSIVE METABOLIC PANEL
ALT: 24 IU/L (ref 0–32)
AST: 21 IU/L (ref 0–40)
Albumin/Globulin Ratio: 1.8 (ref 1.2–2.2)
Albumin: 4.9 g/dL — ABNORMAL HIGH (ref 3.8–4.8)
Alkaline Phosphatase: 76 IU/L (ref 44–121)
BUN/Creatinine Ratio: 18 (ref 12–28)
BUN: 15 mg/dL (ref 8–27)
Bilirubin Total: 0.6 mg/dL (ref 0.0–1.2)
CO2: 23 mmol/L (ref 20–29)
Calcium: 10.1 mg/dL (ref 8.7–10.3)
Chloride: 101 mmol/L (ref 96–106)
Creatinine, Ser: 0.83 mg/dL (ref 0.57–1.00)
GFR calc Af Amer: 86 mL/min/{1.73_m2} (ref >59)
GFR calc non Af Amer: 74 mL/min/{1.73_m2} (ref >59)
Globulin, Total: 2.7 g/dL (ref 1.5–4.5)
Glucose: 161 mg/dL — ABNORMAL HIGH (ref 65–99)
Potassium: 4.1 mmol/L (ref 3.5–5.2)
Sodium: 141 mmol/L (ref 134–144)
Total Protein: 7.6 g/dL (ref 6.0–8.5)

## 2020-05-20 ENCOUNTER — Telehealth: Payer: Self-pay | Admitting: Nurse Practitioner

## 2020-05-20 NOTE — Telephone Encounter (Signed)
Called the Language line to have a [place] interpreter call in order to confirm with Pt that appt is virtual. Have not received call for [facility] interpreter as of yet. If Pt shows up for appt 3/15 will get him reschedule for an in person appt.

## 2020-05-21 ENCOUNTER — Other Ambulatory Visit: Payer: Self-pay

## 2020-05-21 ENCOUNTER — Ambulatory Visit: Payer: Medicare Other | Attending: Nurse Practitioner | Admitting: Nurse Practitioner

## 2020-05-21 ENCOUNTER — Other Ambulatory Visit: Payer: Self-pay | Admitting: Nurse Practitioner

## 2020-05-21 DIAGNOSIS — E785 Hyperlipidemia, unspecified: Secondary | ICD-10-CM | POA: Diagnosis not present

## 2020-05-21 DIAGNOSIS — R7989 Other specified abnormal findings of blood chemistry: Secondary | ICD-10-CM

## 2020-05-21 DIAGNOSIS — J4540 Moderate persistent asthma, uncomplicated: Secondary | ICD-10-CM

## 2020-05-21 DIAGNOSIS — R7303 Prediabetes: Secondary | ICD-10-CM

## 2020-05-21 DIAGNOSIS — I10 Essential (primary) hypertension: Secondary | ICD-10-CM

## 2020-05-21 DIAGNOSIS — R21 Rash and other nonspecific skin eruption: Secondary | ICD-10-CM | POA: Diagnosis not present

## 2020-05-21 DIAGNOSIS — E1165 Type 2 diabetes mellitus with hyperglycemia: Secondary | ICD-10-CM

## 2020-05-21 MED ORDER — TRIAMCINOLONE ACETONIDE 0.1 % EX CREA: 1.0000 "application " | TOPICAL_CREAM | Freq: Two times a day (BID) | CUTANEOUS | 0 refills | Status: DC

## 2020-05-21 MED ORDER — MONTELUKAST SODIUM 10 MG PO TABS: 10.0000 mg | ORAL_TABLET | Freq: Every day | ORAL | 3 refills | Status: DC

## 2020-05-21 NOTE — Progress Notes (Unsigned)
Virtual Visit via Telephone Note Due to national recommendations of social distancing due to [place] 19, telehealth visit is felt to be most appropriate for this patient at this time.  I discussed the limitations, risks, security and privacy concerns of performing an evaluation and management service by telephone and the availability of in person appointments. I also discussed with the patient that there may be a patient responsible charge related to this service. The patient expressed understanding and agreed to proceed.    I connected with Tina [name] on 05/21/20  at    EDT by telephone and verified that I am speaking with the correct person using two identifiers.   Consent I discussed the limitations, risks, security and privacy concerns of performing an evaluation and management service by telephone and the availability of in person appointments. I also discussed with the patient that there may be a patient responsible charge related to this service. The patient expressed understanding and agreed to proceed.   Location of Patient: Private  ***   Location of Provider: [facility]    Persons participating in Telemedicine visit: [name] FNP-BC [place]  Interpreter: NIE   History of Present Illness: Telemedicine visit for: Follow up She has a past medical history of Asthma, Nephrolithiasis, and Prediabetes.   Prediabetes Well controlled. She is not taking any medications for diabetes.  Lab Results  Component Value Date   HGBA1C 6.2 (A) 11/22/2019    Headaches She endorses headache radiating from her neck to the back of her head. Headaches occurring once or twice a week. Taking tylenol relieves the pain. Headaches occur different times of the day. Headaches occurring on the top of her head.   Rash She has an erythematous rash on her left leg. Associated symptoms include itching.  Onset 2-3 months ago.    Past Medical History:   Diagnosis Date  . Asthma   . Nephrolithiasis    "came out on it's own" (12/21/2012)  . Prediabetes     Past Surgical History:  Procedure Laterality Date  . CHOLECYSTECTOMY N/A 12/21/2012   Procedure: LAPAROSCOPIC CHOLECYSTECTOMY;  Surgeon: Ralene Ok, MD;  Location: [place];  Service: General;  Laterality: N/A;  . KIDNEY STONE SURGERY     spouse denies this hx on 12/21/2012  . LAPAROSCOPIC CHOLECYSTECTOMY  12/21/2012    Family History  Problem Relation Age of Onset  . Tuberculosis Mother   . Asthma Mother   . Asthma Father   . Tuberculosis Father     Social History   Socioeconomic History  . Marital status: Married    Spouse name: Not on file  . Number of children: Not on file  . Years of education: Not on file  . Highest education level: Not on file  Occupational History  . Not on file  Tobacco Use  . Smoking status: Never Smoker  . Smokeless tobacco: Never Used  Vaping Use  . Vaping Use: Never used  Substance and Sexual Activity  . Alcohol use: Not Currently  . Drug use: Never  . Sexual activity: Yes  Other Topics Concern  . Not on file  Social History Narrative   ** Merged History Encounter **       Social Determinants of Health   Financial Resource Strain: Not on file  Food Insecurity: Not on file  Transportation Needs: Not on file  Physical Activity: Not on file  Stress: Not on file  Social Connections: Not on file  Observations/Objective: Awake, alert and oriented x 3   ROS  Assessment and Plan: There are no diagnoses linked to this encounter.   Follow Up Instructions No follow-ups on file.     I discussed the assessment and treatment plan with the patient. The patient was provided an opportunity to ask questions and all were answered. The patient agreed with the plan and demonstrated an understanding of the instructions.   The patient was advised to call back or seek an in-person evaluation if the symptoms worsen or if the condition  fails to improve as anticipated.  I provided *** minutes of non-face-to-face time during this encounter including median intraservice time, reviewing previous notes, labs, imaging, medications and explaining diagnosis and management.  Gildardo Pounds, FNP-BC

## 2020-05-22 LAB — LIPID PANEL
Chol/HDL Ratio: 3.4 ratio (ref 0.0–4.4)
Cholesterol, Total: 172 mg/dL (ref 100–199)
HDL: 50 mg/dL (ref >39)
LDL Chol Calc (NIH): 98 mg/dL (ref 0–99)
Triglycerides: 138 mg/dL (ref 0–149)
VLDL Cholesterol Cal: 24 mg/dL (ref 5–40)

## 2020-05-22 LAB — CMP14+EGFR
ALT: 36 IU/L — ABNORMAL HIGH (ref 0–32)
AST: 22 IU/L (ref 0–40)
Albumin/Globulin Ratio: 1.7 (ref 1.2–2.2)
Albumin: 4.5 g/dL (ref 3.8–4.8)
Alkaline Phosphatase: 102 IU/L (ref 44–121)
BUN/Creatinine Ratio: 16 (ref 12–28)
BUN: 13 mg/dL (ref 8–27)
Bilirubin Total: 0.3 mg/dL (ref 0.0–1.2)
CO2: 22 mmol/L (ref 20–29)
Calcium: 9.5 mg/dL (ref 8.7–10.3)
Chloride: 105 mmol/L (ref 96–106)
Creatinine, Ser: 0.83 mg/dL (ref 0.57–1.00)
Globulin, Total: 2.7 g/dL (ref 1.5–4.5)
Glucose: 210 mg/dL — ABNORMAL HIGH (ref 65–99)
Potassium: 4 mmol/L (ref 3.5–5.2)
Sodium: 142 mmol/L (ref 134–144)
Total Protein: 7.2 g/dL (ref 6.0–8.5)
eGFR: 78 mL/min/{1.73_m2} (ref >59)

## 2020-05-22 LAB — CBC
Hematocrit: 42.4 % (ref 34.0–46.6)
Hemoglobin: 13.2 g/dL (ref 11.1–15.9)
MCH: 24.4 pg — ABNORMAL LOW (ref 26.6–33.0)
MCHC: 31.1 g/dL — ABNORMAL LOW (ref 31.5–35.7)
MCV: 78 fL — ABNORMAL LOW (ref 79–97)
Platelets: 187 10*3/uL (ref 150–450)
RBC: 5.41 x10E6/uL — ABNORMAL HIGH (ref 3.77–5.28)
RDW: 14.3 % (ref 11.7–15.4)
WBC: 6.9 10*3/uL (ref 3.4–10.8)

## 2020-05-22 LAB — HEMOGLOBIN A1C
Est. average glucose Bld gHb Est-mCnc: 171 mg/dL
Hgb A1c MFr Bld: 7.6 % — ABNORMAL HIGH (ref 4.8–5.6)

## 2020-05-24 ENCOUNTER — Encounter: Payer: Self-pay | Admitting: Nurse Practitioner

## 2020-05-24 MED ORDER — METFORMIN HCL 500 MG PO TABS: 500.0000 mg | ORAL_TABLET | Freq: Two times a day (BID) | ORAL | 3 refills | Status: DC

## 2020-05-24 NOTE — Progress Notes (Addendum)
Virtual Visit via Telephone Note Due to national recommendations of social distancing due to [place] 19, telehealth visit is felt to be most appropriate for this patient at this time.  I discussed the limitations, risks, security and privacy concerns of performing an evaluation and management service by telephone and the availability of in person appointments. I also discussed with the patient that there may be a patient responsible charge related to this service. The patient expressed understanding and agreed to proceed.    I connected with [name] on 05/24/20  at   1:30 PM EDT  EDT by telephone and verified that I am speaking with the correct person using two identifiers.   Consent I discussed the limitations, risks, security and privacy concerns of performing an evaluation and management service by telephone and the availability of in person appointments. I also discussed with the patient that there may be a patient responsible charge related to this service. The patient expressed understanding and agreed to proceed.   Location of Patient: Private Residence   Location of Provider: [place] [facility] [employer] Office    Persons participating in Telemedicine visit: Tina Rankins FNP-BC [place] interpreter   History of Present Illness: Telemedicine visit for: follow up  She has a past medical history of Asthma, Nephrolithiasis, and Prediabetes.  SKIN PROBLEM Has complaints of rash on both legs that she describes as blue and red. Denies any trauma or injury. She does not take ASA daily.   Asthma Well controlled with Advair and Singulair daily and Pro air prn.   DM 2  A1c now 7.6.  Will need to start metformin at this time. LDL not at goal. She has been prescribed atorvastatin 20 mg daily.  Lab Results  Component Value Date   HGBA1C 7.6 (H) 05/21/2020   Lab Results  Component Value Date   LDLCALC 98 05/21/2020    Past Medical History:   Diagnosis Date  . Asthma   . Nephrolithiasis    "came out on it's own" (12/21/2012)  . Prediabetes     Past Surgical History:  Procedure Laterality Date  . CHOLECYSTECTOMY N/A 12/21/2012   Procedure: LAPAROSCOPIC CHOLECYSTECTOMY;  Surgeon: Ralene Ok, MD;  Location: [place];  Service: General;  Laterality: N/A;  . KIDNEY STONE SURGERY     spouse denies this hx on 12/21/2012  . LAPAROSCOPIC CHOLECYSTECTOMY  12/21/2012    Family History  Problem Relation Age of Onset  . Tuberculosis Mother   . Asthma Mother   . Asthma Father   . Tuberculosis Father     Social History   Socioeconomic History  . Marital status: Married    Spouse name: Not on file  . Number of children: Not on file  . Years of education: Not on file  . Highest education level: Not on file  Occupational History  . Not on file  Tobacco Use  . Smoking status: Never Smoker  . Smokeless tobacco: Never Used  Vaping Use  . Vaping Use: Never used  Substance and Sexual Activity  . Alcohol use: Not Currently  . Drug use: Never  . Sexual activity: Yes  Other Topics Concern  . Not on file  Social History Narrative   ** Merged History Encounter **       Social Determinants of Health   Financial Resource Strain: Not on file  Food Insecurity: Not on file  Transportation Needs: Not on file  Physical Activity: Not on file  Stress:  Not on file  Social Connections: Not on file     Observations/Objective: Awake, alert and oriented x 3   ROS  Assessment and Plan: Diagnoses and all orders for this visit:  Type 2 diabetes mellitus with hyperglycemia, without long-term current use of insulin (HCC) -     metFORMIN (GLUCOPHAGE) 500 MG tablet; Take 1 tablet (500 mg total) by mouth 2 (two) times daily with a meal.  Skin rash Triamcinolone 0.1 mg BID topical   Follow Up Instructions Return in about 8 weeks (around 07/16/2020) for skin rash left leg.     I discussed the assessment and treatment plan with  the patient. The patient was provided an opportunity to ask questions and all were answered. The patient agreed with the plan and demonstrated an understanding of the instructions.   The patient was advised to call back or seek an in-person evaluation if the symptoms worsen or if the condition fails to improve as anticipated.  I provided 13 minutes of non-face-to-face time during this encounter including median intraservice time, reviewing previous notes, labs, imaging, medications and explaining diagnosis and management.  Gildardo Pounds, FNP-BC

## 2020-05-27 ENCOUNTER — Telehealth: Payer: Self-pay | Admitting: *Deleted

## 2020-05-27 NOTE — Telephone Encounter (Signed)
Pt's husband given results and recommendations per [name], "Cholesterol levels are normal. Continue cholesterol medication as prescribed. A1c increased from 6.2 to 7.6. I am starting you on metformin 500 mg twice a day. Please pick this medication up from the pharmacy. CBC does not indicate any severe anemia or bleeding disorders. Kidney, liver function and electrolytes are normal."; he was also informed the prescription was sent to [facility]; the pt's husband verbalized understanding.

## 2020-07-01 ENCOUNTER — Other Ambulatory Visit: Payer: Self-pay

## 2020-07-01 ENCOUNTER — Ambulatory Visit (INDEPENDENT_AMBULATORY_CARE_PROVIDER_SITE_OTHER): Payer: Medicare Other | Admitting: Allergy

## 2020-07-01 ENCOUNTER — Encounter: Payer: Self-pay | Admitting: Allergy

## 2020-07-01 VITALS — BP 136/78 | HR 68 | Temp 98.2°F | Resp 12 | Wt 129.0 lb

## 2020-07-01 DIAGNOSIS — J4540 Moderate persistent asthma, uncomplicated: Secondary | ICD-10-CM

## 2020-07-01 DIAGNOSIS — J3089 Other allergic rhinitis: Secondary | ICD-10-CM | POA: Diagnosis not present

## 2020-07-01 DIAGNOSIS — H1013 Acute atopic conjunctivitis, bilateral: Secondary | ICD-10-CM

## 2020-07-01 DIAGNOSIS — J302 Other seasonal allergic rhinitis: Secondary | ICD-10-CM | POA: Diagnosis not present

## 2020-07-01 DIAGNOSIS — H1010 Acute atopic conjunctivitis, unspecified eye: Secondary | ICD-10-CM

## 2020-07-01 MED ORDER — FLUTICASONE-SALMETEROL 100-50 MCG/DOSE IN AEPB
1.0000 | INHALATION_SPRAY | Freq: Two times a day (BID) | RESPIRATORY_TRACT | 5 refills | Status: DC
Start: 2020-07-01 — End: 2020-07-18

## 2020-07-01 NOTE — Progress Notes (Signed)
Follow Up Note  RE: Tina [name] MRN: [id number] DOB: 07-28-1955 Date of Office Visit: 07/01/2020  Referring provider: Gildardo Pounds, NP Primary care provider: Gildardo Pounds, NP  Chief Complaint: Asthma (Using her inhaler and needs refills)  History of Present Illness: I had the pleasure of seeing Tina [name] for a follow up visit at the [facility] on 07/01/2020. She is a 65 y.o. female, who is being followed for asthma and allergic rhinoconjunctivitis. Her previous allergy office visit was on 01/01/2020 with Dr. Maudie Mercury. Today is a regular follow up visit. Interpreter present in person.   Moderate persistent asthma ACT score 19.  Denies any SOB, wheezing, chest tightness, nocturnal awakenings, ER/urgent care visits since the last visit.  Coughing once a in awhile.  Currently taking Advair 140mcg 1 puff twice a day and rinse mouth after each use.  Stopped using Singulair as it caused ? chest pains.  Only using albuterol if needed and last use was 3 months ago.   Seasonal and perennial allergic rhinoconjunctivitis Asymptomatic with no medications.   Assessment and Plan: Tina [name] is a 65 y.o. female with: Moderate persistent asthma without complication Well-controlled. No albuterol the last 3 months. No longer on Singulair - caused chest pains. . ACT score 19. . Today's spirometry showed some restriction.  . Daily controller medication(s): continue Advair 137mcg 1 puff twice a day and rinse mouth after each use.  . May use albuterol rescue inhaler 2 puffs every 4 to 6 hours as needed for shortness of breath, chest tightness, coughing, and wheezing. May use albuterol rescue inhaler 2 puffs 5 to 15 minutes prior to strenuous physical activities. Monitor frequency of use.  . Repeat spirometry at next visit - if doing well will step down therapy.   Seasonal and perennial allergic rhinoconjunctivitis Past history - 2018 skin testing was positive to tree, mold, dust  mite. Medicated nasal sprays will not be used due to perceived side effects. Interim history - asymptomatic with no meds.  Continue environmental control measures.  May use over the counter antihistamines such as Zyrtec (cetirizine), Claritin (loratadine), Allegra (fexofenadine), or Xyzal (levocetirizine) daily as needed.  Return in about 6 months (around 12/31/2020).  Meds ordered this encounter  Medications  . Fluticasone-Salmeterol (ADVAIR DISKUS) 100-50 MCG/DOSE AEPB    Sig: Inhale 1 puff into the lungs 2 (two) times daily. Rinse mouth after each use.    Dispense:  1 each    Refill:  5   Lab Orders  No laboratory test(s) ordered today    Diagnostics: Spirometry:  Tracings reviewed. Her effort: It was hard to get consistent efforts and there is a question as to whether this reflects a maximal maneuver. FVC: 1.64L FEV1: 1.37L, 68% predicted FEV1/FVC ratio: 84% Interpretation: Spirometry consistent with possible restrictive disease.  Please see scanned spirometry results for details.  Medication List:  Current Outpatient Medications  Medication Sig Dispense Refill  . albuterol (PROAIR HFA) 108 (90 Base) MCG/ACT inhaler Use 1-2 puffs every 4-6 hours as needed for cough, wheeze or shortness of breathe 18 g 1  . atorvastatin (LIPITOR) 20 MG tablet Take 1 tablet (20 mg total) by mouth daily. 90 tablet 3  . diphenhydrAMINE (BENADRYL ALLERGY) 25 MG tablet Take 1 tablet (25 mg total) by mouth every 6 (six) hours as needed for itching. 30 tablet 0  . famotidine (PEPCID) 20 MG tablet Take 1 tablet (20 mg total) by mouth 2 (two) times daily. 60 tablet 5  .  metFORMIN (GLUCOPHAGE) 500 MG tablet Take 1 tablet (500 mg total) by mouth 2 (two) times daily with a meal. 180 tablet 3  . Multiple Vitamins-Minerals (VITAMIN D3 COMPLETE PO) Take 1,000 Units by mouth daily.    Marland Kitchen triamcinolone (KENALOG) 0.1 % Apply 1 application topically 2 (two) times daily. 30 g 0  . VITAMIN D PO Take 2,000 Units  by mouth.    . Fluticasone-Salmeterol (ADVAIR DISKUS) 100-50 MCG/DOSE AEPB Inhale 1 puff into the lungs 2 (two) times daily. Rinse mouth after each use. 1 each 5  . Melatonin 5 MG CAPS Take 1 capsule by mouth daily. (Patient not taking: Reported on 07/01/2020)    . MELATONIN PO Take 5 mg by mouth. (Patient not taking: Reported on 07/01/2020)     No current facility-administered medications for this visit.   Allergies: Allergies  Allergen Reactions  . Hctz [Hydrochlorothiazide] Shortness Of Breath  . Lisinopril Shortness Of Breath   I reviewed her past medical history, social history, family history, and environmental history and no significant changes have been reported from her previous visit.  Review of Systems  Constitutional: Negative for appetite change, chills, fever and unexpected weight change.  HENT: Negative for congestion and rhinorrhea.   Eyes: Negative for itching.  Respiratory: Negative for cough, chest tightness, shortness of breath and wheezing.   Gastrointestinal: Negative for abdominal pain.  Skin: Negative for rash.  Allergic/Immunologic: Positive for environmental allergies.  Neurological: Negative for headaches.   Objective: BP 136/78   Pulse 68   Temp 98.2 F (36.8 C)   Resp 12   Wt 129 lb (58.5 kg)   SpO2 98%   BMI 28.16 kg/m  Body mass index is 28.16 kg/m. Physical Exam Vitals and nursing note reviewed.  Constitutional:      Appearance: Normal appearance. She is well-developed.  HENT:     Head: Normocephalic and atraumatic.     Right Ear: External ear normal.     Left Ear: External ear normal.     Nose: Nose normal.     Mouth/Throat:     Mouth: Mucous membranes are moist.     Pharynx: Oropharynx is clear.  Eyes:     Conjunctiva/sclera: Conjunctivae normal.  Cardiovascular:     Rate and Rhythm: Normal rate and regular rhythm.     Heart sounds: Normal heart sounds. No murmur heard.   Pulmonary:     Effort: Pulmonary effort is normal.      Breath sounds: Normal breath sounds. No wheezing, rhonchi or rales.  Musculoskeletal:     Cervical back: Neck supple.  Skin:    General: Skin is warm.     Findings: No rash.  Neurological:     Mental Status: She is alert and oriented to person, place, and time.  Psychiatric:        Behavior: Behavior normal.    Previous notes and tests were reviewed. The plan was reviewed with the patient/family, and all questions/concerned were addressed.  It was my pleasure to see Kelina today and participate in her care. Please feel free to contact me with any questions or concerns.  Sincerely,  Rexene Alberts, DO Allergy & Immunology  [facility] office: [place] office: [phone]

## 2020-07-01 NOTE — Patient Instructions (Addendum)
Asthma: . Daily controller medication(s): continue Advair 138mcg 1 puff twice a day and rinse mouth after each use.  . May use albuterol rescue inhaler 2 puffs every 4 to 6 hours as needed for shortness of breath, chest tightness, coughing, and wheezing. May use albuterol rescue inhaler 2 puffs 5 to 15 minutes prior to strenuous physical activities. Monitor frequency of use.  . Asthma control goals:  o Full participation in all desired activities (may need albuterol before activity) o Albuterol use two times or less a week on average (not counting use with activity) o Cough interfering with sleep two times or less a month o Oral steroids no more than once a year o No hospitalizations  Allergic rhino conjunctivitis:  2018 skin testing was positive to tree, mold, dust mite.  Continue environmental control measures.  May use over the counter antihistamines such as Zyrtec (cetirizine), Claritin (loratadine), Allegra (fexofenadine), or Xyzal (levocetirizine) daily as needed.  Follow up in 6 months or sooner if needed.   Reducing Pollen Exposure . Pollen seasons: trees (spring), grass (summer) and ragweed/weeds (fall). Marland Kitchen Keep windows closed in your home and car to lower pollen exposure.  Tina [name] air conditioning in the bedroom and throughout the house if possible.  . Avoid going out in dry windy days - especially early morning. . Pollen counts are highest between 5 - 10 AM and on dry, hot and windy days.  . Save outside activities for late afternoon or after a heavy rain, when pollen levels are lower.  . Avoid mowing of grass if you have grass pollen allergy. Marland Kitchen Be aware that pollen can also be transported indoors on people and pets.  . Dry your clothes in an automatic dryer rather than hanging them outside where they might collect pollen.  . Rinse hair and eyes before bedtime. Mold Control . Mold and fungi can grow on a variety of surfaces provided certain temperature and moisture  conditions exist.  . Outdoor molds grow on plants, decaying vegetation and soil. The major outdoor mold, Alternaria and Cladosporium, are found in very high numbers during hot and dry conditions. Generally, a late summer - fall peak is seen for common outdoor fungal spores. Rain will temporarily lower outdoor mold spore count, but counts rise rapidly when the rainy period ends. . The most important indoor molds are Aspergillus and Penicillium. Dark, humid and poorly ventilated basements are ideal sites for mold growth. The next most common sites of mold growth are the bathroom and the kitchen. Outdoor (Seasonal) Mold Control . Use air conditioning and keep windows closed. . Avoid exposure to decaying vegetation. Marland Kitchen Avoid leaf raking. . Avoid grain handling. . Consider wearing a face mask if working in moldy areas.  Indoor (Perennial) Mold Control  . Maintain humidity below 50%. . Get rid of mold growth on hard surfaces with water, detergent and, if necessary, 5% bleach (do not mix with other cleaners). Then dry the area completely. If mold covers an area more than 10 square feet, consider hiring an indoor environmental professional. . For clothing, washing with soap and water is best. If moldy items cannot be cleaned and dried, throw them away. . Remove sources e.g. contaminated carpets. . Repair and seal leaking roofs or pipes. Using dehumidifiers in damp basements may be helpful, but empty the water and clean units regularly to prevent mildew from forming. All rooms, especially basements, bathrooms and kitchens, require ventilation and cleaning to deter mold and mildew growth. Avoid carpeting on  concrete or damp floors, and storing items in damp areas. Control of House Dust Mite Allergen . Dust mite allergens are a common trigger of allergy and asthma symptoms. While they can be found throughout the house, these microscopic creatures thrive in warm, humid environments such as bedding, upholstered  furniture and carpeting. . Because so much time is spent in the bedroom, it is essential to reduce mite levels there.  . Encase pillows, mattresses, and box springs in special allergen-proof fabric covers or airtight, zippered plastic covers.  . Bedding should be washed weekly in hot water (130 F) and dried in a hot dryer. Allergen-proof covers are available for comforters and pillows that can't be regularly washed.  Tina [name] the allergy-proof covers every few months. Minimize clutter in the bedroom. Keep pets out of the bedroom.  Marland Kitchen Keep humidity less than 50% by using a dehumidifier or air conditioning. You can buy a humidity measuring device called a hygrometer to monitor this.  . If possible, replace carpets with hardwood, linoleum, or washable area rugs. If that's not possible, vacuum frequently with a vacuum that has a HEPA filter. . Remove all upholstered furniture and non-washable window drapes from the bedroom. . Remove all non-washable stuffed toys from the bedroom.  Wash stuffed toys weekly.

## 2020-07-01 NOTE — Assessment & Plan Note (Signed)
Well-controlled. No albuterol the last 3 months. No longer on Singulair - caused chest pains. . ACT score 19. . Today's spirometry showed some restriction.  . Daily controller medication(s): continue Advair 145mcg 1 puff twice a day and rinse mouth after each use.  . May use albuterol rescue inhaler 2 puffs every 4 to 6 hours as needed for shortness of breath, chest tightness, coughing, and wheezing. May use albuterol rescue inhaler 2 puffs 5 to 15 minutes prior to strenuous physical activities. Monitor frequency of use.  . Repeat spirometry at next visit - if doing well will step down therapy.

## 2020-07-01 NOTE — Assessment & Plan Note (Signed)
Past history - 2018 skin testing was positive to tree, mold, dust mite. Medicated nasal sprays will not be used due to perceived side effects. Interim history - asymptomatic with no meds.  Continue environmental control measures.  May use over the counter antihistamines such as Zyrtec (cetirizine), Claritin (loratadine), Allegra (fexofenadine), or Xyzal (levocetirizine) daily as needed.

## 2020-07-18 ENCOUNTER — Encounter: Payer: Self-pay | Admitting: Nurse Practitioner

## 2020-07-18 ENCOUNTER — Ambulatory Visit: Payer: Medicare Other | Attending: Nurse Practitioner | Admitting: Nurse Practitioner

## 2020-07-18 ENCOUNTER — Other Ambulatory Visit: Payer: Self-pay

## 2020-07-18 VITALS — BP 138/76 | HR 67 | Resp 16 | Wt 129.0 lb

## 2020-07-18 DIAGNOSIS — J4540 Moderate persistent asthma, uncomplicated: Secondary | ICD-10-CM

## 2020-07-18 DIAGNOSIS — E1165 Type 2 diabetes mellitus with hyperglycemia: Secondary | ICD-10-CM

## 2020-07-18 DIAGNOSIS — R21 Rash and other nonspecific skin eruption: Secondary | ICD-10-CM | POA: Diagnosis not present

## 2020-07-18 MED ORDER — FLUTICASONE-SALMETEROL 100-50 MCG/ACT IN AEPB: 1.0000 | INHALATION_SPRAY | Freq: Two times a day (BID) | RESPIRATORY_TRACT | 3 refills | Status: DC

## 2020-07-18 MED ORDER — ALBUTEROL SULFATE HFA 108 (90 BASE) MCG/ACT IN AERS: 1.0000 | INHALATION_SPRAY | Freq: Four times a day (QID) | RESPIRATORY_TRACT | 1 refills | Status: DC | PRN

## 2020-07-18 MED ORDER — MOMETASONE FUROATE 0.1 % EX CREA: TOPICAL_CREAM | Freq: Every day | CUTANEOUS | 1 refills | Status: DC

## 2020-07-18 NOTE — Progress Notes (Signed)
Assessment & Plan:  Tina [name] was seen today for rash.  Diagnoses and all orders for this visit:  Moderate persistent asthma without complication -     albuterol (PROAIR HFA) 108 (90 Base) MCG/ACT inhaler; Inhale 1-2 puffs into the lungs every 6 (six) hours as needed for wheezing or shortness of breath. -     fluticasone-salmeterol (ADVAIR) 100-50 MCG/ACT AEPB; Inhale 1 puff into the lungs 2 (two) times daily.  Rash and nonspecific skin eruption -     mometasone (ELOCON) 0.1 % cream; Apply topically daily.  Type 2 diabetes mellitus with hyperglycemia, without long-term current use of insulin (HCC) Continue blood sugar control as discussed in office today, low carbohydrate diet, and regular physical exercise as tolerated, 150 minutes per week (30 min each day, 5 days per week, or 50 min 3 days per week). Keep blood sugar logs with fasting goal of 90-130 mg/dl, post prandial (after you eat) less than 180.  For Hypoglycemia: BS <60 and Hyperglycemia BS >400; contact the clinic ASAP. Annual eye exams and foot exams are recommended.   Patient has been counseled on age-appropriate routine health concerns for screening and prevention. These are reviewed and up-to-date. Referrals have been placed accordingly. Immunizations are up-to-date or declined.    Subjective:   Chief Complaint  Patient presents with  . Rash   HPI Tina [name] 65 y.o. female presents to office today for follow up Onsite Interpreter was used to communicate directly with patient for the entire encounter including providing detailed patient instructions.   She is concerned that meformin is causing loss of appetite. I explained to her that this is a side effect of metformin and healthy weight loss can help control her diabetes.    Rash on left leg. Triamcinolone ineffective.    Asthma Symptoms under control. Denies any excessive use of her SABA inhaler. Using advair as prescribed   DM 2 Not at goal. Taking metformin  500 mg BID as prescribed Lab Results  Component Value Date   HGBA1C 7.6 (H) 05/21/2020    Review of Systems  Constitutional: Negative for fever, malaise/fatigue and weight loss.  HENT: Negative.  Negative for nosebleeds.   Eyes: Negative.  Negative for blurred vision, double vision and photophobia.  Respiratory: Negative.  Negative for cough and shortness of breath.   Cardiovascular: Negative.  Negative for chest pain, palpitations and leg swelling.  Gastrointestinal: Negative.  Negative for heartburn, nausea and vomiting.  Musculoskeletal: Negative.  Negative for myalgias.  Skin: Positive for itching and rash.  Neurological: Negative.  Negative for dizziness, focal weakness, seizures and headaches.  Psychiatric/Behavioral: Negative.  Negative for suicidal ideas.    Past Medical History:  Diagnosis Date  . Asthma   . Nephrolithiasis    "came out on it's own" (12/21/2012)  . Prediabetes     Past Surgical History:  Procedure Laterality Date  . CHOLECYSTECTOMY N/A 12/21/2012   Procedure: LAPAROSCOPIC CHOLECYSTECTOMY;  Surgeon: Ralene Ok, MD;  Location: [facility];  Service: General;  Laterality: N/A;  . KIDNEY STONE SURGERY     spouse denies this hx on 12/21/2012  . LAPAROSCOPIC CHOLECYSTECTOMY  12/21/2012    Family History  Problem Relation Age of Onset  . Tuberculosis Mother   . Asthma Mother   . Asthma Father   . Tuberculosis Father     Social History Reviewed with no changes to be made today.   Outpatient Medications Prior to Visit  Medication Sig Dispense Refill  . atorvastatin (LIPITOR) 20  MG tablet Take 1 tablet (20 mg total) by mouth daily. 90 tablet 3  . metFORMIN (GLUCOPHAGE) 500 MG tablet Take 1 tablet (500 mg total) by mouth 2 (two) times daily with a meal. 180 tablet 3  . albuterol (PROAIR HFA) 108 (90 Base) MCG/ACT inhaler Use 1-2 puffs every 4-6 hours as needed for cough, wheeze or shortness of breathe 18 g 1  . triamcinolone (KENALOG) 0.1 % Apply 1  application topically 2 (two) times daily. 30 g 0  . MELATONIN PO Take 5 mg by mouth. (Patient not taking: No sig reported)    . Multiple Vitamins-Minerals (VITAMIN D3 COMPLETE PO) Take 1,000 Units by mouth daily. (Patient not taking: Reported on 07/18/2020)    . VITAMIN D PO Take 2,000 Units by mouth.    . diphenhydrAMINE (BENADRYL ALLERGY) 25 MG tablet Take 1 tablet (25 mg total) by mouth every 6 (six) hours as needed for itching. (Patient not taking: Reported on 07/18/2020) 30 tablet 0  . famotidine (PEPCID) 20 MG tablet Take 1 tablet (20 mg total) by mouth 2 (two) times daily. (Patient not taking: Reported on 07/18/2020) 60 tablet 5  . Fluticasone-Salmeterol (ADVAIR DISKUS) 100-50 MCG/DOSE AEPB Inhale 1 puff into the lungs 2 (two) times daily. Rinse mouth after each use. (Patient not taking: Reported on 07/18/2020) 1 each 5  . Melatonin 5 MG CAPS Take 1 capsule by mouth daily. (Patient not taking: No sig reported)     No facility-administered medications prior to visit.    Allergies  Allergen Reactions  . Hctz [Hydrochlorothiazide] Shortness Of Breath  . Lisinopril Shortness Of Breath       Objective:    BP 138/76 (BP Location: Right Arm, Patient Position: Sitting, Cuff Size: Normal)   Pulse 67   Resp 16   Wt 129 lb (58.5 kg)   SpO2 98%   BMI 28.16 kg/m  Wt Readings from Last 3 Encounters:  07/18/20 129 lb (58.5 kg)  07/01/20 129 lb (58.5 kg)  02/23/20 128 lb 6.4 oz (58.2 kg)    Physical Exam Vitals and nursing note reviewed.  Constitutional:      Appearance: She is well-developed.  HENT:     Head: Normocephalic and atraumatic.  Cardiovascular:     Rate and Rhythm: Normal rate and regular rhythm.     Heart sounds: Normal heart sounds. No murmur heard. No friction rub. No gallop.   Pulmonary:     Effort: Pulmonary effort is normal. No tachypnea or respiratory distress.     Breath sounds: Normal breath sounds. No decreased breath sounds, wheezing, rhonchi or rales.   Chest:     Chest wall: No tenderness.  Abdominal:     General: Bowel sounds are normal.     Palpations: Abdomen is soft.  Musculoskeletal:        General: Normal range of motion.     Cervical back: Normal range of motion.  Skin:    General: Skin is warm and dry.     Findings: Rash present. Rash is macular.  Neurological:     Mental Status: She is alert and oriented to person, place, and time.     Coordination: Coordination normal.  Psychiatric:        Behavior: Behavior normal. Behavior is cooperative.        Thought Content: Thought content normal.        Judgment: Judgment normal.          Patient has been counseled extensively about nutrition and  exercise as well as the importance of adherence with medications and regular follow-up. The patient was given clear instructions to go to ER or return to medical center if symptoms don't improve, worsen or new problems develop. The patient verbalized understanding.   Follow-up: Return in about 7 weeks (around 09/02/2020) for DM.   Gildardo Pounds, FNP-BC [facility] and [place], [place]   07/25/2020, 8:01 PM

## 2020-07-18 NOTE — Progress Notes (Signed)
Rash on left lower leg - medication does not help.  advair inhaler does not help  Needs refills on albuterol inhaler

## 2020-07-25 ENCOUNTER — Encounter: Payer: Self-pay | Admitting: Nurse Practitioner

## 2020-09-16 ENCOUNTER — Ambulatory Visit: Payer: Medicare Other | Attending: Nurse Practitioner | Admitting: Nurse Practitioner

## 2020-09-16 ENCOUNTER — Encounter: Payer: Self-pay | Admitting: Nurse Practitioner

## 2020-09-16 DIAGNOSIS — E1165 Type 2 diabetes mellitus with hyperglycemia: Secondary | ICD-10-CM

## 2020-09-16 DIAGNOSIS — R7989 Other specified abnormal findings of blood chemistry: Secondary | ICD-10-CM | POA: Diagnosis not present

## 2020-09-16 DIAGNOSIS — D72829 Elevated white blood cell count, unspecified: Secondary | ICD-10-CM | POA: Diagnosis not present

## 2020-09-16 DIAGNOSIS — J4540 Moderate persistent asthma, uncomplicated: Secondary | ICD-10-CM | POA: Diagnosis not present

## 2020-09-16 DIAGNOSIS — R5383 Other fatigue: Secondary | ICD-10-CM | POA: Diagnosis not present

## 2020-09-16 DIAGNOSIS — Z1211 Encounter for screening for malignant neoplasm of colon: Secondary | ICD-10-CM

## 2020-09-16 DIAGNOSIS — R21 Rash and other nonspecific skin eruption: Secondary | ICD-10-CM | POA: Diagnosis not present

## 2020-09-16 DIAGNOSIS — R7303 Prediabetes: Secondary | ICD-10-CM | POA: Diagnosis not present

## 2020-09-16 MED ORDER — MOMETASONE FUROATE 0.1 % EX CREA: TOPICAL_CREAM | Freq: Every day | CUTANEOUS | 1 refills | Status: DC

## 2020-09-16 NOTE — Progress Notes (Signed)
Virtual Visit via Telephone Note Due to national recommendations of social distancing due to [place] 19, telehealth visit is felt to be most appropriate for this patient at this time.  I discussed the limitations, risks, security and privacy concerns of performing an evaluation and management service by telephone and the availability of in person appointments. I also discussed with the patient that there may be a patient responsible charge related to this service. The patient expressed understanding and agreed to proceed.    I connected with Tina [name] on 09/16/20  at   4:10 PM EDT  EDT by telephone and verified that I am speaking with the correct person using two identifiers.  Location of Patient: Private Residence   Location of Provider: [place] and [employer] Office    Persons participating in Telemedicine visit: Tina Rankins FNP-BC Tina [name] interpreter    History of Present Illness: Telemedicine visit for: Follow up  has a past medical history of Asthma, Diabetes mellitus without complication ([place]), and Nephrolithiasis.    Asthma Well controlled. She denies cough,wheezing or shortness of breath.   Diabetes Mellitus Type 2 Not optimal at this time. She is taking metformin 500 mg BID. Denies any symptoms of hypo or hyperglycemia. Does endorse fatigue and decreased appetite. LDL not at goal with lipitor 20 mg daily.  Lab Results  Component Value Date   HGBA1C 7.6 (H) 05/21/2020    Lab Results  Component Value Date   LDLCALC 98 05/21/2020      Past Medical History:  Diagnosis Date   Asthma    Diabetes mellitus without complication ([place])    Nephrolithiasis    "came out on it's own" (12/21/2012)    Past Surgical History:  Procedure Laterality Date   CHOLECYSTECTOMY N/A 12/21/2012   Procedure: LAPAROSCOPIC CHOLECYSTECTOMY;  Surgeon: Ralene Ok, MD;  Location: [facility];  Service: General;  Laterality: N/A;   KIDNEY STONE SURGERY     spouse  denies this hx on 12/21/2012   LAPAROSCOPIC CHOLECYSTECTOMY  12/21/2012    Family History  Problem Relation Age of Onset   Tuberculosis Mother    Asthma Mother    Asthma Father    Tuberculosis Father     Social History   Socioeconomic History   Marital status: Married    Spouse name: Not on file   Number of children: Not on file   Years of education: Not on file   Highest education level: Not on file  Occupational History   Not on file  Tobacco Use   Smoking status: Never   Smokeless tobacco: Never  Vaping Use   Vaping Use: Never used  Substance and Sexual Activity   Alcohol use: Not Currently   Drug use: Never   Sexual activity: Yes  Other Topics Concern   Not on file  Social History Narrative   ** Merged History Encounter **       Social Determinants of Health   Financial Resource Strain: Not on file  Food Insecurity: Not on file  Transportation Needs: Not on file  Physical Activity: Not on file  Stress: Not on file  Social Connections: Not on file     Observations/Objective: Awake, alert and oriented x 3   Review of Systems  Constitutional:  Positive for malaise/fatigue. Negative for fever and weight loss.  HENT: Negative.  Negative for nosebleeds.   Eyes: Negative.  Negative for blurred vision, double vision and photophobia.  Respiratory: Negative.  Negative for cough and shortness of  breath.   Cardiovascular: Negative.  Negative for chest pain, palpitations and leg swelling.  Gastrointestinal: Negative.  Negative for heartburn, nausea and vomiting.  Musculoskeletal: Negative.  Negative for myalgias.  Neurological: Negative.  Negative for dizziness, focal weakness, seizures and headaches.  Psychiatric/Behavioral: Negative.  Negative for suicidal ideas.    Assessment and Plan: Diagnoses and all orders for this visit:  Type 2 diabetes mellitus with hyperglycemia, without long-term current use of insulin (HCC) -     Hemoglobin A1c -      CMP14+EGFR Continue blood sugar control as discussed in office today, low carbohydrate diet, and regular physical exercise as tolerated, 150 minutes per week (30 min each day, 5 days per week, or 50 min 3 days per week). Keep blood sugar logs with fasting goal of 90-130 mg/dl, post prandial (after you eat) less than 180.  For Hypoglycemia: BS <60 and Hyperglycemia BS >400; contact the clinic ASAP. Annual eye exams and foot exams are recommended.   Rash and nonspecific skin eruption -     mometasone (ELOCON) 0.1 % cream; Apply topically daily.  Colon cancer screening -     Ambulatory referral to Gastroenterology  Fatigue, unspecified type -     TSH -     CBC -     Iron, TIBC and Ferritin Panel  Abnormal CBC -     CBC -     Iron, TIBC and Ferritin Panel  Moderate persistent asthma without complication  Well controlled symptoms at this time  Follow Up Instructions Return in about 3 months (around 12/17/2020).     I discussed the assessment and treatment plan with the patient. The patient was provided an opportunity to ask questions and all were answered. The patient agreed with the plan and demonstrated an understanding of the instructions.   The patient was advised to call back or seek an in-person evaluation if the symptoms worsen or if the condition fails to improve as anticipated.  I provided 13 minutes of non-face-to-face time during this encounter including median intraservice time, reviewing previous notes, labs, imaging, medications and explaining diagnosis and management.  Gildardo Pounds, FNP-BC

## 2020-09-17 ENCOUNTER — Other Ambulatory Visit: Payer: Self-pay | Admitting: Nurse Practitioner

## 2020-09-17 DIAGNOSIS — D72829 Elevated white blood cell count, unspecified: Secondary | ICD-10-CM

## 2020-09-17 LAB — CMP14+EGFR
ALT: 16 IU/L (ref 0–32)
AST: 14 IU/L (ref 0–40)
Albumin/Globulin Ratio: 2.2 (ref 1.2–2.2)
Albumin: 4.9 g/dL — ABNORMAL HIGH (ref 3.8–4.8)
Alkaline Phosphatase: 75 IU/L (ref 44–121)
BUN/Creatinine Ratio: 14 (ref 12–28)
BUN: 13 mg/dL (ref 8–27)
Bilirubin Total: 0.5 mg/dL (ref 0.0–1.2)
CO2: 22 mmol/L (ref 20–29)
Calcium: 10.2 mg/dL (ref 8.7–10.3)
Chloride: 103 mmol/L (ref 96–106)
Creatinine, Ser: 0.9 mg/dL (ref 0.57–1.00)
Globulin, Total: 2.2 g/dL (ref 1.5–4.5)
Glucose: 97 mg/dL (ref 65–99)
Potassium: 4.1 mmol/L (ref 3.5–5.2)
Sodium: 142 mmol/L (ref 134–144)
Total Protein: 7.1 g/dL (ref 6.0–8.5)
eGFR: 71 mL/min/{1.73_m2} (ref >59)

## 2020-09-17 LAB — CBC
Hematocrit: 40.4 % (ref 34.0–46.6)
Hemoglobin: 13.1 g/dL (ref 11.1–15.9)
MCH: 24.7 pg — ABNORMAL LOW (ref 26.6–33.0)
MCHC: 32.4 g/dL (ref 31.5–35.7)
MCV: 76 fL — ABNORMAL LOW (ref 79–97)
Platelets: 190 10*3/uL (ref 150–450)
RBC: 5.31 x10E6/uL — ABNORMAL HIGH (ref 3.77–5.28)
RDW: 14.8 % (ref 11.7–15.4)
WBC: 13.5 10*3/uL — ABNORMAL HIGH (ref 3.4–10.8)

## 2020-09-17 LAB — IRON,TIBC AND FERRITIN PANEL
Ferritin: 357 ng/mL — ABNORMAL HIGH (ref 15–150)
Iron Saturation: 13 % — ABNORMAL LOW (ref 15–55)
Iron: 36 ug/dL (ref 27–139)
Total Iron Binding Capacity: 280 ug/dL (ref 250–450)
UIBC: 244 ug/dL (ref 118–369)

## 2020-09-17 LAB — HEMOGLOBIN A1C
Est. average glucose Bld gHb Est-mCnc: 126 mg/dL
Hgb A1c MFr Bld: 6 % — ABNORMAL HIGH (ref 4.8–5.6)

## 2020-09-17 LAB — TSH: TSH: 3.61 u[IU]/mL (ref 0.450–4.500)

## 2020-09-18 LAB — CBC WITH DIFFERENTIAL/PLATELET
Basophils Absolute: 0.1 10*3/uL (ref 0.0–0.2)
Basos: 0 %
EOS (ABSOLUTE): 0.5 10*3/uL — ABNORMAL HIGH (ref 0.0–0.4)
Eos: 3 %
Hematocrit: 42.5 % (ref 34.0–46.6)
Hemoglobin: 13.4 g/dL (ref 11.1–15.9)
Immature Grans (Abs): 0 10*3/uL (ref 0.0–0.1)
Immature Granulocytes: 0 %
Lymphocytes Absolute: 3.4 10*3/uL — ABNORMAL HIGH (ref 0.7–3.1)
Lymphs: 25 %
MCH: 24.9 pg — ABNORMAL LOW (ref 26.6–33.0)
MCHC: 31.5 g/dL (ref 31.5–35.7)
MCV: 79 fL (ref 79–97)
Monocytes Absolute: 0.7 10*3/uL (ref 0.1–0.9)
Monocytes: 5 %
Neutrophils Absolute: 9.1 10*3/uL — ABNORMAL HIGH (ref 1.4–7.0)
Neutrophils: 67 %
Platelets: 200 10*3/uL (ref 150–450)
RBC: 5.39 x10E6/uL — ABNORMAL HIGH (ref 3.77–5.28)
RDW: 14.9 % (ref 11.7–15.4)
WBC: 13.6 10*3/uL — ABNORMAL HIGH (ref 3.4–10.8)

## 2020-09-18 LAB — SPECIMEN STATUS REPORT

## 2020-09-20 ENCOUNTER — Other Ambulatory Visit: Payer: Self-pay | Admitting: Nurse Practitioner

## 2020-09-20 DIAGNOSIS — D72829 Elevated white blood cell count, unspecified: Secondary | ICD-10-CM

## 2020-12-01 ENCOUNTER — Other Ambulatory Visit: Payer: Self-pay | Admitting: Nurse Practitioner

## 2020-12-01 DIAGNOSIS — E785 Hyperlipidemia, unspecified: Secondary | ICD-10-CM

## 2020-12-02 NOTE — Telephone Encounter (Signed)
Requested Prescriptions  Pending Prescriptions Disp Refills  . atorvastatin (LIPITOR) 20 MG tablet [Pharmacy Med Name: Atorvastatin Calcium 20 MG Oral Tablet] 90 tablet 0    Sig: Take 1 tablet by mouth once daily     Cardiovascular:  Antilipid - Statins Passed - 12/01/2020  9:49 AM      Passed - Total Cholesterol in normal range and within 360 days    Cholesterol, Total  Date Value Ref Range Status  05/21/2020 172 100 - 199 mg/dL Final         Passed - LDL in normal range and within 360 days    LDL Chol Calc (NIH)  Date Value Ref Range Status  05/21/2020 98 0 - 99 mg/dL Final         Passed - HDL in normal range and within 360 days    HDL  Date Value Ref Range Status  05/21/2020 50 >39 mg/dL Final         Passed - Triglycerides in normal range and within 360 days    Triglycerides  Date Value Ref Range Status  05/21/2020 138 0 - 149 mg/dL Final         Passed - Patient is not pregnant      Passed - Valid encounter within last 12 months    Recent Outpatient Visits          2 months ago Type 2 diabetes mellitus with hyperglycemia, without long-term current use of insulin ([place])   [place], [place] W, NP   4 months ago Moderate persistent asthma without complication   [place], [place] W, NP   6 months ago Type 2 diabetes mellitus with hyperglycemia, without long-term current use of insulin [facility])   [place], [place] W, NP   9 months ago Rash and nonspecific skin eruption   [place], Deborah B, MD   1 year ago Prediabetes   [place], Zelda W, NP      Future Appointments            In 2 weeks Gildardo Pounds, NP [place]   In 1 month [place], DO [facility] and [place]           '

## 2020-12-18 ENCOUNTER — Encounter: Payer: Self-pay | Admitting: Nurse Practitioner

## 2020-12-18 ENCOUNTER — Other Ambulatory Visit: Payer: Self-pay

## 2020-12-18 ENCOUNTER — Ambulatory Visit: Payer: Medicare Other | Attending: Nurse Practitioner | Admitting: Nurse Practitioner

## 2020-12-18 VITALS — BP 131/81 | HR 80 | Temp 99.3°F | Ht <= 58 in | Wt 120.4 lb

## 2020-12-18 DIAGNOSIS — D72829 Elevated white blood cell count, unspecified: Secondary | ICD-10-CM | POA: Diagnosis not present

## 2020-12-18 DIAGNOSIS — M25511 Pain in right shoulder: Secondary | ICD-10-CM | POA: Diagnosis not present

## 2020-12-18 DIAGNOSIS — E1165 Type 2 diabetes mellitus with hyperglycemia: Secondary | ICD-10-CM

## 2020-12-18 DIAGNOSIS — I10 Essential (primary) hypertension: Secondary | ICD-10-CM

## 2020-12-18 DIAGNOSIS — D649 Anemia, unspecified: Secondary | ICD-10-CM | POA: Diagnosis not present

## 2020-12-18 DIAGNOSIS — R6889 Other general symptoms and signs: Secondary | ICD-10-CM | POA: Diagnosis not present

## 2020-12-18 DIAGNOSIS — G8929 Other chronic pain: Secondary | ICD-10-CM

## 2020-12-18 LAB — POCT INFLUENZA A/B
Influenza A, POC: NEGATIVE
Influenza B, POC: NEGATIVE

## 2020-12-18 MED ORDER — IBUPROFEN 600 MG PO TABS: 600.0000 mg | ORAL_TABLET | Freq: Three times a day (TID) | ORAL | 0 refills | Status: DC | PRN

## 2020-12-18 MED ORDER — FERROUS GLUCONATE 324 (38 FE) MG PO TABS: 324.0000 mg | ORAL_TABLET | Freq: Every day | ORAL | 3 refills | Status: DC
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
## 2020-12-19 LAB — SARS-COV-2, NAA 2 DAY TAT

## 2020-12-19 LAB — NOVEL CORONAVIRUS, NAA: SARS-CoV-2, NAA: NOT DETECTED

## 2020-12-20 ENCOUNTER — Telehealth: Payer: Self-pay

## 2020-12-20 NOTE — Telephone Encounter (Signed)
-----   Message from Gildardo Pounds, NP sent at 12/19/2020  5:41 PM EDT ----- Negative COVID> she can return to labs for her blood work now.

## 2021-01-01 ENCOUNTER — Ambulatory Visit (INDEPENDENT_AMBULATORY_CARE_PROVIDER_SITE_OTHER): Payer: Medicare Other | Admitting: Allergy

## 2021-01-01 ENCOUNTER — Other Ambulatory Visit: Payer: Self-pay

## 2021-01-01 ENCOUNTER — Encounter: Payer: Self-pay | Admitting: Allergy

## 2021-01-01 VITALS — BP 142/82 | HR 72 | Temp 99.0°F | Resp 18 | Ht <= 58 in | Wt 119.6 lb

## 2021-01-01 DIAGNOSIS — J3089 Other allergic rhinitis: Secondary | ICD-10-CM | POA: Diagnosis not present

## 2021-01-01 DIAGNOSIS — J302 Other seasonal allergic rhinitis: Secondary | ICD-10-CM

## 2021-01-01 DIAGNOSIS — J4540 Moderate persistent asthma, uncomplicated: Secondary | ICD-10-CM | POA: Diagnosis not present

## 2021-01-01 DIAGNOSIS — H1013 Acute atopic conjunctivitis, bilateral: Secondary | ICD-10-CM | POA: Diagnosis not present

## 2021-01-01 DIAGNOSIS — Z23 Encounter for immunization: Secondary | ICD-10-CM | POA: Diagnosis not present

## 2021-01-01 MED ORDER — FLUTICASONE-SALMETEROL 100-50 MCG/ACT IN AEPB: 1.0000 | INHALATION_SPRAY | Freq: Two times a day (BID) | RESPIRATORY_TRACT | 5 refills | Status: DC

## 2021-01-01 NOTE — Assessment & Plan Note (Signed)
Past history - 2018 skin testing was positive to tree, mold, dust mite. Medicated nasal sprays will not be used due to perceived side effects. Interim history - asymptomatic.  Continue environmental control measures.  May use over the counter antihistamines such as Zyrtec (cetirizine), Claritin (loratadine), Allegra (fexofenadine), or Xyzal (levocetirizine) daily as needed.

## 2021-01-01 NOTE — Assessment & Plan Note (Signed)
Well-controlled with daily Advair. Notices symptoms if misses a dose.  . Today's spirometry showed some restriction.  . Daily controller medication(s): continue Advair 153mcg 1 puff twice a day and rinse mouth after each use.  . May use albuterol rescue inhaler 2 puffs every 4 to 6 hours as needed for shortness of breath, chest tightness, coughing, and wheezing. May use albuterol rescue inhaler 2 puffs 5 to 15 minutes prior to strenuous physical activities. Monitor frequency of use.   Get spirometry at next visit.  Flu shot given in the office.

## 2021-01-01 NOTE — Patient Instructions (Addendum)
Asthma: Daily controller medication(s): continue Advair 18mcg 1 puff twice a day and rinse mouth after each use.  May use albuterol rescue inhaler 2 puffs every 4 to 6 hours as needed for shortness of breath, chest tightness, coughing, and wheezing. May use albuterol rescue inhaler 2 puffs 5 to 15 minutes prior to strenuous physical activities. Monitor frequency of use.  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 Continue environmental control measures. May use over the counter antihistamines such as Zyrtec (cetirizine), Claritin (loratadine), Allegra (fexofenadine), or Xyzal (levocetirizine) daily as needed.  Follow up in 6 months or sooner if needed.  Flu shot given today.

## 2021-01-01 NOTE — Progress Notes (Signed)
Follow Up Note  RE: Tina [name] MRN: [id number] DOB: 1955-08-04 Date of Office Visit: 01/01/2021  Referring provider: Gildardo Pounds, NP Primary care provider: Gildardo Pounds, NP  Chief Complaint: Asthma (Normal no wheezing, appetite is good)  History of [name] Illness: I had the pleasure of seeing Tina [name] for a follow up visit at the [facility] [facility] [place] on 01/01/2021. She is a 65 y.o. female, who is being followed for asthma and allergic rhinoconjunctivitis. Her previous allergy office visit was on 07/01/2020 with Dr. Maudie Mercury. Today is a regular follow up visit. Tina [name] in person.   Moderate persistent asthma Denies any SOB, coughing, wheezing, chest tightness, nocturnal awakenings, ER/urgent care visits since the last visit.  Currently taking Advair 113mcg 1 puff BID and rinsing mouth after each use.  Sometimes forgets to take Advair and notices worsening symptoms.  No albuterol use.    Seasonal and perennial allergic rhinoconjunctivitis Asymptomatic with no medications.   Assessment and Plan: Tina [name] is a 65 y.o. female with: Moderate persistent asthma without complication Well-controlled with daily Advair. Notices symptoms if misses a dose.  Today's spirometry showed some restriction.  Daily controller medication(s): continue Advair 122mcg 1 puff twice a day and rinse mouth after each use.  May use albuterol rescue inhaler 2 puffs every 4 to 6 hours as needed for shortness of breath, chest tightness, coughing, and wheezing. May use albuterol rescue inhaler 2 puffs 5 to 15 minutes prior to strenuous physical activities. Monitor frequency of use.  Get spirometry at next visit. Flu shot given in the office.  Seasonal and perennial allergic rhinoconjunctivitis Past history - 2018 skin testing was positive to tree, mold, dust mite. Medicated nasal sprays will not be used due to perceived side effects. Interim history - asymptomatic. Continue  environmental control measures. May use over the counter antihistamines such as Zyrtec (cetirizine), Claritin (loratadine), Allegra (fexofenadine), or Xyzal (levocetirizine) daily as needed.  Return in about 6 months (around 07/02/2021).  Meds ordered this encounter  Medications   fluticasone-salmeterol (ADVAIR) 100-50 MCG/ACT AEPB    Sig: Inhale 1 puff into the lungs 2 (two) times daily. Rinse mouth after each use.    Dispense:  1 each    Refill:  5    Lab Orders  No laboratory test(s) ordered today    Diagnostics: Spirometry:  Tracings reviewed. Her effort: It was hard to get consistent efforts and there is a question as to whether this reflects a maximal maneuver. FVC: 1.44L FEV1: 1.18L, 74% predicted FEV1/FVC ratio: 82% Interpretation: No overt abnormalities noted given today's efforts.  Please see scanned spirometry results for details.   Medication List:  Current Outpatient Medications  Medication Sig Dispense Refill   albuterol (PROAIR HFA) 108 (90 Base) MCG/ACT inhaler Inhale 1-2 puffs into the lungs every 6 (six) hours as needed for wheezing or shortness of breath. 18 g 1   atorvastatin (LIPITOR) 20 MG tablet Take 1 tablet by mouth once daily 90 tablet 0   ibuprofen (ADVIL) 600 MG tablet Take 1 tablet (600 mg total) by mouth every 8 (eight) hours as needed. 60 tablet 0   metFORMIN (GLUCOPHAGE) 500 MG tablet Take 1 tablet (500 mg total) by mouth 2 (two) times daily with a meal. 180 tablet 3   mometasone (ELOCON) 0.1 % cream Apply topically daily. 60 g 1   ferrous gluconate (FERGON) 324 MG tablet Take 1 tablet (324 mg total) by mouth daily. (Patient not taking: Reported on  01/01/2021) 90 tablet 3   fluticasone-salmeterol (ADVAIR) 100-50 MCG/ACT AEPB Inhale 1 puff into the lungs 2 (two) times daily. Rinse mouth after each use. 1 each 5   MELATONIN PO Take 5 mg by mouth. (Patient not taking: Reported on 01/01/2021)     Multiple Vitamins-Minerals (VITAMIN D3 COMPLETE PO) Take  1,000 Units by mouth daily. (Patient not taking: Reported on 01/01/2021)     VITAMIN D PO Take 2,000 Units by mouth. (Patient not taking: Reported on 01/01/2021)     No current facility-administered medications for this visit.   Allergies: Allergies  Allergen Reactions   Hctz [Hydrochlorothiazide] Shortness Of Breath   Lisinopril Shortness Of Breath   Sodium Bicarbonate Tablets [Sodium Bicarbonate] Palpitations   I reviewed her past medical history, social history, family history, and environmental history and no significant changes have been reported from her previous visit.  Review of Systems  Constitutional:  Negative for appetite change, chills, fever and unexpected weight change.  HENT:  Negative for congestion and rhinorrhea.   Eyes:  Negative for itching.  Respiratory:  Negative for cough, chest tightness, shortness of breath and wheezing.   Gastrointestinal:  Negative for abdominal pain.  Skin:  Negative for rash.  Allergic/Immunologic: Positive for environmental allergies.  Neurological:  Negative for headaches.   Objective: BP (!) 142/82   Pulse 72   Temp 99 F (37.2 C) (Temporal)   Resp 18   Ht 4' 9.09" (1.45 m)   Wt 119 lb 9.6 oz (54.3 kg)   SpO2 97%   BMI 25.80 kg/m  Body mass index is 25.8 kg/m. Physical Exam Vitals and nursing note reviewed.  Constitutional:      Appearance: Normal appearance. She is well-developed.  HENT:     Head: Normocephalic and atraumatic.     Right Ear: External ear normal.     Left Ear: External ear normal.     Nose: Nose normal.     Mouth/Throat:     Mouth: Mucous membranes are moist.     Pharynx: Oropharynx is clear.  Eyes:     Conjunctiva/sclera: Conjunctivae normal.  Cardiovascular:     Rate and Rhythm: Normal rate and regular rhythm.     Heart sounds: Normal heart sounds. No murmur heard. Pulmonary:     Effort: Pulmonary effort is normal.     Breath sounds: Normal breath sounds. No wheezing, rhonchi or rales.   Musculoskeletal:     Cervical back: Neck supple.  Skin:    General: Skin is warm.     Findings: No rash.  Neurological:     Mental Status: She is alert and oriented to person, place, and time.  Psychiatric:        Behavior: Behavior normal.  Previous notes and tests were reviewed. The plan was reviewed with the patient/family, and all questions/concerned were addressed.  It was my pleasure to see Lachanda today and participate in her care. Please feel free to contact me with any questions or concerns.  Sincerely,  Rexene Alberts, DO Allergy & Immunology  [facility] office: [place] office: [phone]

## 2021-03-01 ENCOUNTER — Other Ambulatory Visit: Payer: Self-pay | Admitting: Nurse Practitioner

## 2021-03-01 ENCOUNTER — Other Ambulatory Visit: Payer: Self-pay | Admitting: Allergy

## 2021-03-01 DIAGNOSIS — E785 Hyperlipidemia, unspecified: Secondary | ICD-10-CM

## 2021-03-01 DIAGNOSIS — J4540 Moderate persistent asthma, uncomplicated: Secondary | ICD-10-CM

## 2021-03-01 NOTE — Telephone Encounter (Signed)
Requested Prescriptions  Pending Prescriptions Disp Refills   atorvastatin (LIPITOR) 20 MG tablet [Pharmacy Med Name: Atorvastatin Calcium 20 MG Oral Tablet] 90 tablet 0    Sig: Take 1 tablet by mouth once daily     Cardiovascular:  Antilipid - Statins Passed - 03/01/2021  4:45 AM      Passed - Total Cholesterol in normal range and within 360 days    Cholesterol, Total  Date Value Ref Range Status  05/21/2020 172 100 - 199 mg/dL Final         Passed - LDL in normal range and within 360 days    LDL Chol Calc (NIH)  Date Value Ref Range Status  05/21/2020 98 0 - 99 mg/dL Final         Passed - HDL in normal range and within 360 days    HDL  Date Value Ref Range Status  05/21/2020 50 >39 mg/dL Final         Passed - Triglycerides in normal range and within 360 days    Triglycerides  Date Value Ref Range Status  05/21/2020 138 0 - 149 mg/dL Final         Passed - Patient is not pregnant      Passed - Valid encounter within last 12 months    Recent Outpatient Visits          2 months ago Flu-like symptoms   [place], [place] W, NP   5 months ago Type 2 diabetes mellitus with hyperglycemia, without long-term current use of insulin [facility])   [place], [place] W, NP   7 months ago Moderate persistent asthma without complication   [place], [place] W, NP   9 months ago Type 2 diabetes mellitus with hyperglycemia, without long-term current use of insulin [facility])   [place], Vernia Buff, NP   1 year ago Rash and nonspecific skin eruption   [place], Deborah B, MD      Future Appointments            In 2 weeks Gildardo Pounds, NP [place]   In 4 months Garnet Sierras, DO [facility] [place]

## 2021-03-21 ENCOUNTER — Telehealth: Payer: Self-pay

## 2021-03-21 ENCOUNTER — Ambulatory Visit: Payer: Commercial Managed Care - HMO | Attending: Nurse Practitioner | Admitting: Nurse Practitioner

## 2021-03-21 ENCOUNTER — Encounter: Payer: Self-pay | Admitting: Nurse Practitioner

## 2021-03-21 ENCOUNTER — Other Ambulatory Visit: Payer: Self-pay

## 2021-03-21 ENCOUNTER — Telehealth: Payer: Self-pay | Admitting: Nurse Practitioner

## 2021-03-21 DIAGNOSIS — D649 Anemia, unspecified: Secondary | ICD-10-CM

## 2021-03-21 DIAGNOSIS — M25511 Pain in right shoulder: Secondary | ICD-10-CM | POA: Diagnosis not present

## 2021-03-21 DIAGNOSIS — G8929 Other chronic pain: Secondary | ICD-10-CM

## 2021-03-21 DIAGNOSIS — E785 Hyperlipidemia, unspecified: Secondary | ICD-10-CM | POA: Diagnosis not present

## 2021-03-21 DIAGNOSIS — E1165 Type 2 diabetes mellitus with hyperglycemia: Secondary | ICD-10-CM

## 2021-03-21 DIAGNOSIS — D72829 Elevated white blood cell count, unspecified: Secondary | ICD-10-CM | POA: Diagnosis not present

## 2021-03-21 DIAGNOSIS — I10 Essential (primary) hypertension: Secondary | ICD-10-CM | POA: Diagnosis not present

## 2021-03-21 MED ORDER — IBUPROFEN 600 MG PO TABS: 600.0000 mg | ORAL_TABLET | Freq: Three times a day (TID) | ORAL | 1 refills | Status: AC | PRN

## 2021-03-21 MED ORDER — ATORVASTATIN CALCIUM 20 MG PO TABS: 20.0000 mg | ORAL_TABLET | Freq: Every day | ORAL | 3 refills | Status: DC

## 2021-03-21 MED ORDER — FERROUS GLUCONATE 324 (38 FE) MG PO TABS: 324.0000 mg | ORAL_TABLET | Freq: Every day | ORAL | 3 refills | Status: DC

## 2021-03-21 NOTE — Telephone Encounter (Signed)
Error

## 2021-03-21 NOTE — Telephone Encounter (Signed)
With help of [place] [employer]pre[employer] we called  Contact Information  [phone] (Home Phone)  [place] [phone] (Mobile) PASTOR'S NUMBER Hmok,Ythong (Spouse)  [phone] (Mobile) LVM. NO ANSWER

## 2021-03-21 NOTE — Progress Notes (Signed)
Virtual Visit via Telephone Note Due to national recommendations of social distancing due to [place] 19, telehealth visit is felt to be most appropriate for this patient at this time.  I discussed the limitations, risks, security and privacy concerns of performing an evaluation and management service by telephone and the availability of in person appointments. I also discussed with the patient that there may be a patient responsible charge related to this service. The patient expressed understanding and agreed to proceed.    I connected with Tina [name] on 03/21/21  at  10:30 AM EST  EDT by telephone and verified that I am speaking with the correct person using two identifiers.  Location of Patient: Private Residence   Location of Provider: [place] [facility] [employer] Office    Persons participating in Telemedicine visit: [name] FNP-BC [place] interpreter SNOW [phone]  History of Present Illness: Telemedicine visit for: DM   has a past medical history of Asthma, Diabetes mellitus without complication ([place]), and Nephrolithiasis.   DM  Well controlled with metformin 500 mg BID. She denies any symptoms of hypo or hyperglycemia. Her lipids are not at goal with atorvastatin 20 mg daily.  Lab Results  Component Value Date   HGBA1C 6.0 (H) 09/16/2020    Lab Results  Component Value Date   LDLCALC 98 05/21/2020      Past Medical History:  Diagnosis Date   Asthma    Diabetes mellitus without complication ([place])    Nephrolithiasis    "came out on it's own" (12/21/2012)    Past Surgical History:  Procedure Laterality Date   CHOLECYSTECTOMY N/A 12/21/2012   Procedure: LAPAROSCOPIC CHOLECYSTECTOMY;  Surgeon: Ralene Ok, MD;  Location: [facility];  Service: General;  Laterality: N/A;   KIDNEY STONE SURGERY     spouse denies this hx on 12/21/2012   LAPAROSCOPIC CHOLECYSTECTOMY  12/21/2012    Family History  Problem Relation Age of Onset   Tuberculosis  Mother    Asthma Mother    Asthma Father    Tuberculosis Father     Social History   Socioeconomic History   Marital status: Married    Spouse name: Not on file   Number of children: Not on file   Years of education: Not on file   Highest education level: Not on file  Occupational History   Not on file  Tobacco Use   Smoking status: Never   Smokeless tobacco: Never  Vaping Use   Vaping Use: Never used  Substance and Sexual Activity   Alcohol use: Not Currently   Drug use: Never   Sexual activity: Yes  Other Topics Concern   Not on file  Social History Narrative   ** Merged History Encounter **       Social Determinants of Health   Financial Resource Strain: Not on file  Food Insecurity: Not on file  Transportation Needs: Not on file  Physical Activity: Not on file  Stress: Not on file  Social Connections: Not on file     Observations/Objective: Awake, alert and oriented x 3   Review of Systems  Constitutional:  Negative for fever, malaise/fatigue and weight loss.  HENT: Negative.  Negative for nosebleeds.   Eyes: Negative.  Negative for blurred vision, double vision and photophobia.  Respiratory: Negative.  Negative for cough and shortness of breath.   Cardiovascular: Negative.  Negative for chest pain, palpitations and leg swelling.  Gastrointestinal: Negative.  Negative for heartburn, nausea and vomiting.  Musculoskeletal:  Positive for joint pain (chronic right shoulder pain). Negative for myalgias.  Neurological: Negative.  Negative for dizziness, focal weakness, seizures and headaches.  Psychiatric/Behavioral: Negative.  Negative for suicidal ideas.    Assessment and Plan: Diagnoses and all orders for this visit:  Type 2 diabetes mellitus with hyperglycemia, without long-term current use of insulin (HCC) -     CMP14+EGFR -     Hemoglobin A1c  Keep blood sugar logs with fasting goal of 90-130 mg/dl, post prandial (after you eat) less than 180.    Dyslipidemia, goal LDL below 100 -     Lipid panel -     atorvastatin (LIPITOR) 20 MG tablet; Take 1 tablet (20 mg total) by mouth daily. INSTRUCTIONS: Work on a low fat, heart healthy diet and participate in regular aerobic exercise program by working out at least 150 minutes per week; 5 days a week-30 minutes per day. Avoid red meat/beef/steak,  fried foods. junk foods, sodas, sugary drinks, unhealthy snacking, alcohol and smoking.  Drink at least 80 oz of water per day and monitor your carbohydrate intake daily.    Anemia, unspecified type -     CBC with Differential -     ferrous gluconate (FERGON) 324 MG tablet; Take 1 tablet (324 mg total) by mouth daily.  Leukocytosis, unspecified type -     CBC with Differential  Chronic right shoulder pain -     ibuprofen (ADVIL) 600 MG tablet; Take 1 tablet (600 mg total) by mouth every 8 (eight) hours as needed. Work on losing weight to help reduce joint pain. May alternate with heat and ice application for pain relief. May also alternate with acetaminophen  as prescribed pain relief. Other alternatives include massage, acupuncture and water aerobics.     Follow Up Instructions Return in about 3 months (around 06/19/2021).     I discussed the assessment and treatment plan with the patient. The patient was provided an opportunity to ask questions and all were answered. The patient agreed with the plan and demonstrated an understanding of the instructions.   The patient was advised to call back or seek an in-person evaluation if the symptoms worsen or if the condition fails to improve as anticipated.  I provided 11 minutes of non-face-to-face time during this encounter including median intraservice time, reviewing previous notes, labs, imaging, medications and explaining diagnosis and management.  Gildardo Pounds, FNP-BC

## 2021-03-22 LAB — LIPID PANEL
Chol/HDL Ratio: 4.1 ratio (ref 0.0–4.4)
Cholesterol, Total: 192 mg/dL (ref 100–199)
HDL: 47 mg/dL (ref >39)
LDL Chol Calc (NIH): 97 mg/dL (ref 0–99)
Triglycerides: 285 mg/dL — ABNORMAL HIGH (ref 0–149)
VLDL Cholesterol Cal: 48 mg/dL — ABNORMAL HIGH (ref 5–40)

## 2021-03-22 LAB — CMP14+EGFR
ALT: 26 IU/L (ref 0–32)
AST: 21 IU/L (ref 0–40)
Albumin/Globulin Ratio: 2 (ref 1.2–2.2)
Albumin: 5.1 g/dL — ABNORMAL HIGH (ref 3.8–4.8)
Alkaline Phosphatase: 79 IU/L (ref 44–121)
BUN/Creatinine Ratio: 14 (ref 12–28)
BUN: 13 mg/dL (ref 8–27)
Bilirubin Total: 0.4 mg/dL (ref 0.0–1.2)
CO2: 24 mmol/L (ref 20–29)
Calcium: 9.9 mg/dL (ref 8.7–10.3)
Chloride: 104 mmol/L (ref 96–106)
Creatinine, Ser: 0.94 mg/dL (ref 0.57–1.00)
Globulin, Total: 2.5 g/dL (ref 1.5–4.5)
Glucose: 104 mg/dL — ABNORMAL HIGH (ref 70–99)
Potassium: 4.4 mmol/L (ref 3.5–5.2)
Sodium: 143 mmol/L (ref 134–144)
Total Protein: 7.6 g/dL (ref 6.0–8.5)
eGFR: 67 mL/min/{1.73_m2} (ref >59)

## 2021-03-22 LAB — CBC WITH DIFFERENTIAL/PLATELET
Basophils Absolute: 0.1 10*3/uL (ref 0.0–0.2)
Basos: 1 %
EOS (ABSOLUTE): 0.3 10*3/uL (ref 0.0–0.4)
Eos: 4 %
Hematocrit: 41.9 % (ref 34.0–46.6)
Hemoglobin: 14.1 g/dL (ref 11.1–15.9)
Immature Grans (Abs): 0 10*3/uL (ref 0.0–0.1)
Immature Granulocytes: 0 %
Lymphocytes Absolute: 2.5 10*3/uL (ref 0.7–3.1)
Lymphs: 35 %
MCH: 25.5 pg — ABNORMAL LOW (ref 26.6–33.0)
MCHC: 33.7 g/dL (ref 31.5–35.7)
MCV: 76 fL — ABNORMAL LOW (ref 79–97)
Monocytes Absolute: 0.5 10*3/uL (ref 0.1–0.9)
Monocytes: 7 %
Neutrophils Absolute: 3.8 10*3/uL (ref 1.4–7.0)
Neutrophils: 53 %
Platelets: 223 10*3/uL (ref 150–450)
RBC: 5.54 x10E6/uL — ABNORMAL HIGH (ref 3.77–5.28)
RDW: 14.5 % (ref 11.7–15.4)
WBC: 7.2 10*3/uL (ref 3.4–10.8)

## 2021-03-22 LAB — HEMOGLOBIN A1C
Est. average glucose Bld gHb Est-mCnc: 134 mg/dL
Hgb A1c MFr Bld: 6.3 % — ABNORMAL HIGH (ref 4.8–5.6)

## 2021-03-24 ENCOUNTER — Telehealth: Payer: Self-pay

## 2021-03-24 NOTE — Telephone Encounter (Signed)
-----   Message from Gildardo Pounds, NP sent at 03/22/2021  8:16 AM EST ----- Kidney, liver function and electrolytes are normal.  CBC does not indicate any anemia or bleeding disorders  A1c still in prediabetes range.Continue all medications at this time. Cholesterol levels still elevated> Continue cholesterol medication atorvastatin daily

## 2021-03-24 NOTE — Telephone Encounter (Signed)
Called pt with Interpretor K'eyo Eban from [employer] pt did not answer and no vm to be left.

## 2021-04-01 ENCOUNTER — Telehealth: Payer: Self-pay

## 2021-04-01 NOTE — Telephone Encounter (Signed)
Called patient reviewed all information and repeated back to me. Will call if any questions.  With the help of language resources.

## 2021-04-01 NOTE — Telephone Encounter (Signed)
-----   Message from Gildardo Pounds, NP sent at 03/22/2021  8:16 AM EST ----- Kidney, liver function and electrolytes are normal.  CBC does not indicate any anemia or bleeding disorders  A1c still in prediabetes range.Continue all medications at this time. Cholesterol levels still elevated> Continue cholesterol medication atorvastatin daily

## 2021-05-21 ENCOUNTER — Other Ambulatory Visit: Payer: Self-pay | Admitting: Nurse Practitioner

## 2021-05-21 ENCOUNTER — Other Ambulatory Visit: Payer: Self-pay

## 2021-05-21 ENCOUNTER — Ambulatory Visit: Payer: Medicare Other | Attending: Nurse Practitioner | Admitting: Nurse Practitioner

## 2021-05-21 ENCOUNTER — Encounter: Payer: Self-pay | Admitting: Nurse Practitioner

## 2021-05-21 ENCOUNTER — Ambulatory Visit
Admission: RE | Admit: 2021-05-21 | Discharge: 2021-05-21 | Disposition: A | Discharge status: Home / Self Care | Payer: Medicare Other | Source: Ambulatory Visit | Attending: Nurse Practitioner | Admitting: Nurse Practitioner

## 2021-05-21 VITALS — BP 137/80 | HR 70 | Resp 16 | Wt 122.2 lb

## 2021-05-21 DIAGNOSIS — E1165 Type 2 diabetes mellitus with hyperglycemia: Secondary | ICD-10-CM

## 2021-05-21 DIAGNOSIS — J4540 Moderate persistent asthma, uncomplicated: Secondary | ICD-10-CM | POA: Diagnosis not present

## 2021-05-21 DIAGNOSIS — Z9189 Other specified personal risk factors, not elsewhere classified: Secondary | ICD-10-CM | POA: Diagnosis not present

## 2021-05-21 DIAGNOSIS — M25511 Pain in right shoulder: Secondary | ICD-10-CM

## 2021-05-21 DIAGNOSIS — G8929 Other chronic pain: Secondary | ICD-10-CM

## 2021-05-21 DIAGNOSIS — E785 Hyperlipidemia, unspecified: Secondary | ICD-10-CM | POA: Diagnosis not present

## 2021-05-21 DIAGNOSIS — Z1211 Encounter for screening for malignant neoplasm of colon: Secondary | ICD-10-CM

## 2021-05-21 DIAGNOSIS — D649 Anemia, unspecified: Secondary | ICD-10-CM

## 2021-05-21 DIAGNOSIS — Z1231 Encounter for screening mammogram for malignant neoplasm of breast: Secondary | ICD-10-CM

## 2021-05-21 DIAGNOSIS — R21 Rash and other nonspecific skin eruption: Secondary | ICD-10-CM | POA: Diagnosis not present

## 2021-05-21 LAB — GLUCOSE, POCT (MANUAL RESULT ENTRY): POC Glucose: 152 mg/dl — AB (ref 70–99)

## 2021-05-21 MED ORDER — MONTELUKAST SODIUM 10 MG PO TABS: 10.0000 mg | ORAL_TABLET | Freq: Every day | ORAL | 3 refills | Status: DC

## 2021-05-21 MED ORDER — FLUTICASONE-SALMETEROL 100-50 MCG/ACT IN AEPB: 1.0000 | INHALATION_SPRAY | Freq: Two times a day (BID) | RESPIRATORY_TRACT | 5 refills | Status: DC

## 2021-05-21 MED ORDER — MOMETASONE FUROATE 0.1 % EX CREA: TOPICAL_CREAM | Freq: Every day | CUTANEOUS | 2 refills | Status: DC

## 2021-05-21 MED ORDER — ATORVASTATIN CALCIUM 20 MG PO TABS: 20.0000 mg | ORAL_TABLET | Freq: Every day | ORAL | 3 refills | Status: DC

## 2021-05-21 MED ORDER — DICLOFENAC SODIUM 1 % EX GEL: 2.0000 g | Freq: Four times a day (QID) | CUTANEOUS | 3 refills | Status: DC

## 2021-05-21 MED ORDER — METFORMIN HCL 500 MG PO TABS: 500.0000 mg | ORAL_TABLET | Freq: Two times a day (BID) | ORAL | 3 refills | Status: DC

## 2021-05-21 MED ORDER — FERROUS GLUCONATE 324 (38 FE) MG PO TABS: 324.0000 mg | ORAL_TABLET | Freq: Every day | ORAL | 3 refills | Status: DC

## 2021-05-22 ENCOUNTER — Encounter: Payer: Self-pay | Admitting: Nurse Practitioner
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
## 2021-05-25 ENCOUNTER — Other Ambulatory Visit: Payer: Self-pay | Admitting: Nurse Practitioner

## 2021-05-25 DIAGNOSIS — G8929 Other chronic pain: Secondary | ICD-10-CM

## 2021-07-01 NOTE — Progress Notes (Signed)
? ?Follow Up Note ? ?RE: Tina [name] MRN: [id number] DOB: [birth year] ?Date of Office Visit: 07/02/2021 ? ?Referring provider: Gildardo Pounds, NP ?Primary care provider: Gildardo Pounds, NP ? ?Chief Complaint: Asthma (6 mth f/u - No change  Patient states she started wheezing and coughing last Monday//ACT: 17) ? ?History of Present Illness: ?I had the pleasure of seeing Tina [name] for a follow up visit at the [facility] on 07/02/2021. She is a 66 y.o. female, who is being followed for asthma and allergic rhinoconjunctivitis. Her previous allergy office visit was on 01/01/2021 with Dr. Maudie Mercury. Today is a regular follow up visit. [place] interpreter present in person.  ? ?Moderate persistent asthma ?Patient started to have issues with wheezing and dry coughing last week especially at night. Today feeling better.  ?Denies any fevers/chills. No sick contacts. ? ?Currently taking Advair 152mg 1 puff twice a day, Singulair at night and using albuterol as needed. This week she had to use albuterol at night but she hasn't used albuterol prior to that for a long time.  ? ?Denies any ER/urgent care visits or prednisone use since the last visit. ? ?Seasonal and perennial allergic rhinoconjunctivitis ?Itchy eyes currently but does not have any eye drops. ? ?Assessment and Plan: ?Taiz is a 66y.o. female with: ?Moderate persistent asthma without complication ?Symptoms flared the past week. ?Today's spirometry showed some restriction.  ?Daily controller medication(s):  ?START Trelegy 2075m 1 puff once a day and rinse mouth after each use. Sample given and demonstrated proper use.  ?Do not use Advair when using this.  ?After finished with Trelegy then restart Advair 10014m1 puff twice a day and rinse mouth after each use.  ?Spacer given and demonstrated proper use with inhaler. Patient understood technique and all questions/concerned were addressed.  ?May use albuterol rescue inhaler 2 puffs every 4 to 6 hours  as needed for shortness of breath, chest tightness, coughing, and wheezing. May use albuterol rescue inhaler 2 puffs 5 to 15 minutes prior to strenuous physical activities. Monitor frequency of use.  ?Get spirometry at next visit. ? ?Seasonal and perennial allergic rhinoconjunctivitis ?Past history - 2018 skin testing was positive to tree, mold, dust mite. Medicated nasal sprays will not be used due to perceived side effects. ?Interim history - itchy eyes. ?Continue environmental control measures. ?Continue Singulair (montelukast) '10mg'$  daily at night. ?May use over the counter antihistamines such as Zyrtec (cetirizine), Claritin (loratadine), Allegra (fexofenadine), or Xyzal (levocetirizine) daily as needed. ?Use olopatadine eye drops 0.7% once a day as needed for itchy/watery eyes. ?Sample given. ? ?Return in about 3 months (around 10/01/2021). ? ?No orders of the defined types were placed in this encounter. ? ?Lab Orders  ?No laboratory test(s) ordered today  ? ? ?Diagnostics: ?Spirometry:  ?Tracings reviewed. Her effort: It was hard to get consistent efforts and there is a question as to whether this reflects a maximal maneuver. ?FVC: 1.40L ?FEV1: 1.05L, 66% predicted ?FEV1/FVC ratio: 75% ?Interpretation: Spirometry consistent with possible restrictive disease.  ?Please see scanned spirometry results for details. ? ?Medication List:  ?Current Outpatient Medications  ?Medication Sig Dispense Refill  ? albuterol (PROAIR HFA) 108 (90 Base) MCG/ACT inhaler Inhale 1-2 puffs into the lungs every 6 (six) hours as needed for wheezing or shortness of breath. 18 g 1  ? atorvastatin (LIPITOR) 20 MG tablet Take 1 tablet (20 mg total) by mouth daily. FOR CHOLESTEROL 90 tablet 3  ? diclofenac Sodium (VOLTAREN) 1 %  GEL Apply 2 g topically 4 (four) times daily. 200 g 3  ? ferrous gluconate (FERGON) 324 MG tablet Take 1 tablet (324 mg total) by mouth daily. FOR IRON or ANEMIA 90 tablet 3  ? fluticasone-salmeterol (ADVAIR) 100-50  MCG/ACT AEPB Inhale 1 puff into the lungs 2 (two) times daily. Rinse mouth after each use. 1 each 5  ? ibuprofen (ADVIL) 600 MG tablet Take 1 tablet (600 mg total) by mouth every 8 (eight) hours as needed. 60 tablet 1  ? metFORMIN (GLUCOPHAGE) 500 MG tablet Take 1 tablet (500 mg total) by mouth 2 (two) times daily with a meal. FOR DIABETES 180 tablet 3  ? mometasone (ELOCON) 0.1 % cream Apply topically daily. 60 g 2  ? montelukast (SINGULAIR) 10 MG tablet Take 1 tablet (10 mg total) by mouth at bedtime. 90 tablet 3  ? ?No current facility-administered medications for this visit.  ? ?Allergies: ?Allergies  ?Allergen Reactions  ? Hctz [Hydrochlorothiazide] Shortness Of Breath  ? Lisinopril Shortness Of Breath  ? Sodium Bicarbonate Tablets [Sodium Bicarbonate] Palpitations  ? ?I reviewed her past medical history, social history, family history, and environmental history and no significant changes have been reported from her previous visit. ? ?Review of Systems  ?Constitutional:  Negative for appetite change, chills, fever and unexpected weight change.  ?HENT:  Negative for congestion and rhinorrhea.   ?Eyes:  Positive for itching.  ?Respiratory:  Negative for cough, chest tightness, shortness of breath and wheezing.   ?Gastrointestinal:  Negative for abdominal pain.  ?Skin:  Negative for rash.  ?Allergic/Immunologic: Positive for environmental allergies.  ?Neurological:  Negative for headaches.  ? ?Objective: ?BP 140/82   Pulse 67   Temp 98.5 ?F (36.9 ?C)   Resp 16   Ht 4' 9.5" (1.461 m)   Wt 120 lb 6.4 oz (54.6 kg)   SpO2 98%   BMI 25.60 kg/m?  ?Body mass index is 25.6 kg/m?Marland Kitchen ?Physical Exam ?Vitals and nursing note reviewed.  ?Constitutional:   ?   Appearance: Normal appearance. She is well-developed.  ?HENT:  ?   Head: Normocephalic and atraumatic.  ?   Right Ear: Tympanic membrane and external ear normal.  ?   Left Ear: Tympanic membrane and external ear normal.  ?   Nose: Nose normal.  ?   Mouth/Throat:  ?    Mouth: Mucous membranes are moist.  ?   Pharynx: Oropharynx is clear.  ?Eyes:  ?   Conjunctiva/sclera: Conjunctivae normal.  ?Cardiovascular:  ?   Rate and Rhythm: Normal rate and regular rhythm.  ?   Heart sounds: Normal heart sounds. No murmur heard. ?Pulmonary:  ?   Effort: Pulmonary effort is normal.  ?   Breath sounds: Normal breath sounds. No wheezing, rhonchi or rales.  ?Musculoskeletal:  ?   Cervical back: Neck supple.  ?Skin: ?   General: Skin is warm.  ?   Findings: No rash.  ?Neurological:  ?   Mental Status: She is alert and oriented to person, place, and time.  ?Psychiatric:     ?   Behavior: Behavior normal.  ? ?Previous notes and tests were reviewed. ?The plan was reviewed with the patient/family, and all questions/concerned were addressed. ? ?It was my pleasure to see Tandra today and participate in her care. Please feel free to contact me with any questions or concerns. ? ?Sincerely, ? ?Rexene Alberts, DO ?Allergy & Immunology ? ?[facility] ?[place] office: [phone] ?[place] office: [phone] ?

## 2021-07-02 ENCOUNTER — Encounter: Payer: Self-pay | Admitting: Allergy

## 2021-07-02 ENCOUNTER — Ambulatory Visit (INDEPENDENT_AMBULATORY_CARE_PROVIDER_SITE_OTHER): Payer: Medicare Other | Admitting: Allergy

## 2021-07-02 VITALS — BP 140/82 | HR 67 | Temp 98.5°F | Resp 16 | Ht <= 58 in | Wt 120.4 lb

## 2021-07-02 DIAGNOSIS — J4540 Moderate persistent asthma, uncomplicated: Secondary | ICD-10-CM

## 2021-07-02 DIAGNOSIS — H1010 Acute atopic conjunctivitis, unspecified eye: Secondary | ICD-10-CM

## 2021-07-02 DIAGNOSIS — J45998 Other asthma: Secondary | ICD-10-CM | POA: Diagnosis not present

## 2021-07-02 DIAGNOSIS — H1013 Acute atopic conjunctivitis, bilateral: Secondary | ICD-10-CM

## 2021-07-02 DIAGNOSIS — J302 Other seasonal allergic rhinitis: Secondary | ICD-10-CM

## 2021-07-02 NOTE — Assessment & Plan Note (Signed)
Symptoms flared the past week. ?? Today's spirometry showed some restriction.  ?? Daily controller medication(s):  ?o START Trelegy 257mg 1 puff once a day and rinse mouth after each use. Sample given and demonstrated proper use.  ?- Do not use Advair when using this.  ?? After finished with Trelegy then restart Advair 1058m 1 puff twice a day and rinse mouth after each use.  ?Spacer given and demonstrated proper use with inhaler. Patient understood technique and all questions/concerned were addressed.  ?? May use albuterol rescue inhaler 2 puffs every 4 to 6 hours as needed for shortness of breath, chest tightness, coughing, and wheezing. May use albuterol rescue inhaler 2 puffs 5 to 15 minutes prior to strenuous physical activities. Monitor frequency of use.  ?? Get spirometry at next visit. ?

## 2021-07-02 NOTE — Assessment & Plan Note (Signed)
Past history - 2018 skin testing was positive to tree, mold, dust mite. Medicated nasal sprays will not be used due to perceived side effects. ?Interim history - itchy eyes. ?? Continue environmental control measures. ?? Continue Singulair (montelukast) '10mg'$  daily at night. ?? May use over the counter antihistamines such as Zyrtec (cetirizine), Claritin (loratadine), Allegra (fexofenadine), or Xyzal (levocetirizine) daily as needed. ?? Use olopatadine eye drops 0.7% once a day as needed for itchy/watery eyes. ?? Sample given. ?

## 2021-07-02 NOTE — Patient Instructions (Addendum)
Asthma: ?Daily controller medication(s):  ?START Trelegy 210mg 1 puff once a day and rinse mouth after each use. Sample given and demonstrated proper use.  ?Do not use Advair when using this.  ?After finished with Trelegy then restart Advair 1020m 1 puff twice a day and rinse mouth after each use.  ? ?May use albuterol rescue inhaler 2 puffs every 4 to 6 hours as needed for shortness of breath, chest tightness, coughing, and wheezing. May use albuterol rescue inhaler 2 puffs 5 to 15 minutes prior to strenuous physical activities. Monitor frequency of use.  ?Spacer - demonstrated proper use with inhaler. Patient understood technique and all questions/concerned were addressed.  ?Asthma control goals:  ?Full participation in all desired activities (may need albuterol before activity) ?Albuterol use two times or less a week on average (not counting use with activity) ?Cough interfering with sleep two times or less a month ?Oral steroids no more than once a year ?No hospitalizations ? ?Allergic rhino conjunctivitis: ?2018 skin testing was positive to tree, mold, dust mite. ?Continue environmental control measures. ?Continue Singulair (montelukast) '10mg'$  daily at night. ?May use over the counter antihistamines such as Zyrtec (cetirizine), Claritin (loratadine), Allegra (fexofenadine), or Xyzal (levocetirizine) daily as needed. ?Use olopatadine eye drops 0.7% once a day as needed for itchy/watery eyes. ?Sample given. ? ?Follow up in 3 months or sooner if needed.  ?

## 2021-08-22 ENCOUNTER — Encounter: Payer: Self-pay | Admitting: Nurse Practitioner

## 2021-08-22 ENCOUNTER — Ambulatory Visit: Payer: Medicare Other | Attending: Nurse Practitioner | Admitting: Nurse Practitioner

## 2021-08-22 VITALS — BP 133/74 | HR 64 | Wt 124.6 lb

## 2021-08-22 DIAGNOSIS — E1165 Type 2 diabetes mellitus with hyperglycemia: Secondary | ICD-10-CM

## 2021-08-22 DIAGNOSIS — E785 Hyperlipidemia, unspecified: Secondary | ICD-10-CM | POA: Diagnosis not present

## 2021-08-22 DIAGNOSIS — M25511 Pain in right shoulder: Secondary | ICD-10-CM | POA: Diagnosis not present

## 2021-08-22 DIAGNOSIS — J4540 Moderate persistent asthma, uncomplicated: Secondary | ICD-10-CM | POA: Diagnosis not present

## 2021-08-22 DIAGNOSIS — G8929 Other chronic pain: Secondary | ICD-10-CM | POA: Diagnosis not present

## 2021-08-22 DIAGNOSIS — D649 Anemia, unspecified: Secondary | ICD-10-CM | POA: Diagnosis not present

## 2021-08-22 DIAGNOSIS — R21 Rash and other nonspecific skin eruption: Secondary | ICD-10-CM

## 2021-08-22 LAB — GLUCOSE, POCT (MANUAL RESULT ENTRY): POC Glucose: 139 mg/dl — AB (ref 70–99)

## 2021-08-22 MED ORDER — MOMETASONE FUROATE 0.1 % EX CREA: TOPICAL_CREAM | Freq: Every day | CUTANEOUS | 2 refills | Status: DC

## 2021-08-22 MED ORDER — MONTELUKAST SODIUM 10 MG PO TABS: 10.0000 mg | ORAL_TABLET | Freq: Every day | ORAL | 0 refills | Status: DC

## 2021-08-22 MED ORDER — DICLOFENAC SODIUM 1 % EX GEL: 2.0000 g | Freq: Four times a day (QID) | CUTANEOUS | 3 refills | Status: DC

## 2021-08-22 MED ORDER — ATORVASTATIN CALCIUM 20 MG PO TABS: 20.0000 mg | ORAL_TABLET | Freq: Every day | ORAL | 0 refills | Status: DC

## 2021-08-22 MED ORDER — FLUTICASONE-SALMETEROL 100-50 MCG/ACT IN AEPB: 1.0000 | INHALATION_SPRAY | Freq: Two times a day (BID) | RESPIRATORY_TRACT | 0 refills | Status: DC

## 2021-08-22 MED ORDER — METFORMIN HCL 500 MG PO TABS: 500.0000 mg | ORAL_TABLET | Freq: Two times a day (BID) | ORAL | 0 refills | Status: DC

## 2021-08-22 MED ORDER — FERROUS GLUCONATE 324 (38 FE) MG PO TABS: 324.0000 mg | ORAL_TABLET | Freq: Every day | ORAL | 0 refills | Status: DC

## 2021-08-22 NOTE — Patient Instructions (Signed)
DICLOFENAC or ARTHRITIS GEL for joint pain

## 2021-08-22 NOTE — Progress Notes (Signed)
Assessment & Plan:  [name] was seen today for medication refill.  Diagnoses and all orders for this visit:  Type 2 diabetes mellitus with hyperglycemia, without long-term current use of insulin (HCC) -     POCT glucose (manual entry) -     metFORMIN (GLUCOPHAGE) 500 MG tablet; Take 1 tablet (500 mg total) by mouth 2 (two) times daily with a meal. FOR DIABETES  Dyslipidemia, goal LDL below 100 -     atorvastatin (LIPITOR) 20 MG tablet; Take 1 tablet (20 mg total) by mouth daily. FOR CHOLESTEROL  Moderate persistent asthma without complication -     montelukast (SINGULAIR) 10 MG tablet; Take 1 tablet (10 mg total) by mouth at bedtime. -     fluticasone-salmeterol (ADVAIR) 100-50 MCG/ACT AEPB; Inhale 1 puff into the lungs 2 (two) times daily. Rinse mouth after each use.  Anemia, unspecified type -     ferrous gluconate (FERGON) 324 MG tablet; Take 1 tablet (324 mg total) by mouth daily. FOR IRON or ANEMIA  Rash and nonspecific skin eruption -     mometasone (ELOCON) 0.1 % cream; Apply topically daily.  Chronic right shoulder pain -     diclofenac Sodium (VOLTAREN) 1 % GEL; Apply 2 g topically 4 (four) times daily.    Patient has been counseled on age-appropriate routine health concerns for screening and prevention. These are reviewed and up-to-date. Referrals have been placed accordingly. Immunizations are up-to-date or declined.    Subjective:   Chief Complaint  Patient presents with   Medication Refill    Pt is traveling out the country and will need refills for 6 months   HPI Tina [name] 66 y.o. female presents to office today accompanied by an onsite interpreter. She is requesting medication refills for 6 months in August as she will be leaving for [place].  She has a past medical history of Asthma, Diabetes mellitus without complication ([place]), and Nephrolithiasis.     DM 2 Well controlled with metformin 500 mg BID. She does not monitor her blood glucose levels. LDL not  quite at goal with atorvastatin 20 mg daily. Lab Results  Component Value Date   HGBA1C 6.3 (H) 03/21/2021    Lab Results  Component Value Date   LDLCALC 97 03/21/2021     Asthma Controlled with advair daily use and prn use of albuterol inhaler. She denies cough, wheezing or shortness of breath.   Rash Pt c/o chronic itching and rash. Controlled with steroid cream.   Rash involves neck, trunk, extremities. No problems breathing.  No known food allergies.    Review of Systems  Constitutional:  Negative for fever, malaise/fatigue and weight loss.  HENT: Negative.  Negative for nosebleeds.   Eyes: Negative.  Negative for blurred vision, double vision and photophobia.  Respiratory: Negative.  Negative for cough and shortness of breath.   Cardiovascular: Negative.  Negative for chest pain, palpitations and leg swelling.  Gastrointestinal: Negative.  Negative for heartburn, nausea and vomiting.  Musculoskeletal: Negative.  Negative for myalgias.  Skin:  Positive for itching and rash.  Neurological: Negative.  Negative for dizziness, focal weakness, seizures and headaches.  Psychiatric/Behavioral: Negative.  Negative for suicidal ideas.     Past Medical History:  Diagnosis Date   Asthma    Diabetes mellitus without complication ([place])    Nephrolithiasis    "came out on it's own" (12/21/2012)    Past Surgical History:  Procedure Laterality Date   CHOLECYSTECTOMY N/A 12/21/2012   Procedure: LAPAROSCOPIC  CHOLECYSTECTOMY;  Surgeon: Ralene Ok, MD;  Location: [facility];  Service: General;  Laterality: N/A;   KIDNEY STONE SURGERY     spouse denies this hx on 12/21/2012   LAPAROSCOPIC CHOLECYSTECTOMY  12/21/2012    Family History  Problem Relation Age of Onset   Tuberculosis Mother    Asthma Mother    Asthma Father    Tuberculosis Father     Social History Reviewed with no changes to be made today.   Outpatient Medications Prior to Visit  Medication Sig Dispense Refill    albuterol (PROAIR HFA) 108 (90 Base) MCG/ACT inhaler Inhale 1-2 puffs into the lungs every 6 (six) hours as needed for wheezing or shortness of breath. 18 g 1   diclofenac Sodium (VOLTAREN) 1 % GEL Apply 2 g topically 4 (four) times daily. 200 g 3   ferrous gluconate (FERGON) 324 MG tablet Take 1 tablet (324 mg total) by mouth daily. FOR IRON or ANEMIA 90 tablet 3   fluticasone-salmeterol (ADVAIR) 100-50 MCG/ACT AEPB Inhale 1 puff into the lungs 2 (two) times daily. Rinse mouth after each use. 1 each 5   metFORMIN (GLUCOPHAGE) 500 MG tablet Take 1 tablet (500 mg total) by mouth 2 (two) times daily with a meal. FOR DIABETES 180 tablet 3   mometasone (ELOCON) 0.1 % cream Apply topically daily. 60 g 2   ibuprofen (ADVIL) 600 MG tablet Take 1 tablet (600 mg total) by mouth every 8 (eight) hours as needed. 60 tablet 1   atorvastatin (LIPITOR) 20 MG tablet Take 1 tablet (20 mg total) by mouth daily. FOR CHOLESTEROL 90 tablet 3   montelukast (SINGULAIR) 10 MG tablet Take 1 tablet (10 mg total) by mouth at bedtime. 90 tablet 3   No facility-administered medications prior to visit.    Allergies  Allergen Reactions   Hctz [Hydrochlorothiazide] Shortness Of Breath   Lisinopril Shortness Of Breath   Sodium Bicarbonate Tablets [Sodium Bicarbonate] Palpitations       Objective:    BP 133/74   Pulse 64   Wt 124 lb 9.6 oz (56.5 kg)   SpO2 91%   BMI 26.50 kg/m  Wt Readings from Last 3 Encounters:  08/22/21 124 lb 9.6 oz (56.5 kg)  07/02/21 120 lb 6.4 oz (54.6 kg)  05/21/21 122 lb 3.2 oz (55.4 kg)    Physical Exam Vitals and nursing note reviewed.  Constitutional:      Appearance: She is well-developed.  HENT:     Head: Normocephalic and atraumatic.  Cardiovascular:     Rate and Rhythm: Normal rate and regular rhythm.     Heart sounds: Normal heart sounds. No murmur heard.    No friction rub. No gallop.  Pulmonary:     Effort: Pulmonary effort is normal. No tachypnea or respiratory  distress.     Breath sounds: Normal breath sounds. No decreased breath sounds, wheezing, rhonchi or rales.  Chest:     Chest wall: No tenderness.  Abdominal:     General: Bowel sounds are normal.     Palpations: Abdomen is soft.  Musculoskeletal:        General: Normal range of motion.     Cervical back: Normal range of motion.  Skin:    General: Skin is warm and dry.     Findings: No rash (no visible rashes today).  Neurological:     Mental Status: She is alert and oriented to person, place, and time.     Coordination: Coordination normal.  Psychiatric:  Behavior: Behavior normal. Behavior is cooperative.        Thought Content: Thought content normal.        Judgment: Judgment normal.          Patient has been counseled extensively about nutrition and exercise as well as the importance of adherence with medications and regular follow-up. The patient was given clear instructions to go to ER or return to medical center if symptoms don't improve, worsen or new problems develop. The patient verbalized understanding.   Follow-up: Return if symptoms worsen or fail to improve.   Gildardo Pounds, FNP-BC [facility] [place], [place]   08/22/2021, 4:10 PM

## 2021-08-23 LAB — HEMOGLOBIN A1C
Est. average glucose Bld gHb Est-mCnc: 128 mg/dL
Hgb A1c MFr Bld: 6.1 % — ABNORMAL HIGH (ref 4.8–5.6)

## 2021-08-23 LAB — LIPID PANEL
Chol/HDL Ratio: 4.4 ratio (ref 0.0–4.4)
Cholesterol, Total: 183 mg/dL (ref 100–199)
HDL: 42 mg/dL (ref >39)
LDL Chol Calc (NIH): 97 mg/dL (ref 0–99)
Triglycerides: 258 mg/dL — ABNORMAL HIGH (ref 0–149)
VLDL Cholesterol Cal: 44 mg/dL — ABNORMAL HIGH (ref 5–40)

## 2021-08-23 LAB — CMP14+EGFR
ALT: 32 IU/L (ref 0–32)
AST: 20 IU/L (ref 0–40)
Albumin/Globulin Ratio: 1.9 (ref 1.2–2.2)
Albumin: 4.8 g/dL (ref 3.8–4.8)
Alkaline Phosphatase: 82 IU/L (ref 44–121)
BUN/Creatinine Ratio: 13 (ref 12–28)
BUN: 12 mg/dL (ref 8–27)
Bilirubin Total: 0.4 mg/dL (ref 0.0–1.2)
CO2: 23 mmol/L (ref 20–29)
Calcium: 9.5 mg/dL (ref 8.7–10.3)
Chloride: 105 mmol/L (ref 96–106)
Creatinine, Ser: 0.91 mg/dL (ref 0.57–1.00)
Globulin, Total: 2.5 g/dL (ref 1.5–4.5)
Glucose: 116 mg/dL — ABNORMAL HIGH (ref 70–99)
Potassium: 4.1 mmol/L (ref 3.5–5.2)
Sodium: 141 mmol/L (ref 134–144)
Total Protein: 7.3 g/dL (ref 6.0–8.5)
eGFR: 69 mL/min/{1.73_m2} (ref >59)

## 2021-08-25 ENCOUNTER — Other Ambulatory Visit: Payer: Self-pay | Admitting: Nurse Practitioner

## 2021-08-25 DIAGNOSIS — E785 Hyperlipidemia, unspecified: Secondary | ICD-10-CM

## 2021-08-25 MED ORDER — ATORVASTATIN CALCIUM 40 MG PO TABS: 40.0000 mg | ORAL_TABLET | Freq: Every day | ORAL | 0 refills | Status: DC

## 2021-08-27 ENCOUNTER — Telehealth: Payer: Self-pay | Admitting: Nurse Practitioner

## 2021-08-27 ENCOUNTER — Encounter: Payer: Self-pay | Admitting: *Deleted

## 2021-08-27 NOTE — Telephone Encounter (Signed)
Copied from [place]. Topic: General - Other >> Aug 27, 2021 12:01 PM Chapman Fitch wrote: Reason for CRM: Pt would like a call to go over his lab results/ please advise

## 2021-08-28 ENCOUNTER — Telehealth: Payer: Self-pay | Admitting: Allergy

## 2021-08-28 ENCOUNTER — Encounter: Payer: Self-pay | Admitting: Nurse Practitioner

## 2021-08-28 DIAGNOSIS — J4540 Moderate persistent asthma, uncomplicated: Secondary | ICD-10-CM

## 2021-08-28 MED ORDER — FLUTICASONE-SALMETEROL 100-50 MCG/ACT IN AEPB: 1.0000 | INHALATION_SPRAY | Freq: Two times a day (BID) | RESPIRATORY_TRACT | 0 refills | Status: DC

## 2021-08-28 MED ORDER — MONTELUKAST SODIUM 10 MG PO TABS: 10.0000 mg | ORAL_TABLET | Freq: Every day | ORAL | 0 refills | Status: DC

## 2021-08-28 MED ORDER — ALBUTEROL SULFATE HFA 108 (90 BASE) MCG/ACT IN AERS: 2.0000 | INHALATION_SPRAY | RESPIRATORY_TRACT | 1 refills | Status: DC | PRN

## 2021-08-28 NOTE — Telephone Encounter (Signed)
Please call patient.  Have her reschedule follow up visit for July. Either with me or nurse practitioner.  I will send in but insurance may not approve for more than 3 month supply at a time.  Sent in albuterol, Advair and montelukast.  What nasal spray is she using?

## 2021-08-28 NOTE — Telephone Encounter (Signed)
Patient came in accompanied by friend - pt cancelled appointment for 10-27-2021 as she is going to be out of the country from 10-24-2021 to Feb. 2024.   Patient is requesting  - if possible - a 6 month supply for her albuterol, nasal spray and trelegy, while she is out of the country.   [place].   Best contact number: [phone]

## 2021-08-28 NOTE — Telephone Encounter (Signed)
Called and spoke to the patient husband and he asked for a date for an appointment in July, but when I came back he had hung up and I tried to call back but no answer. I was able to get her an appointment for July 31st at [facility]. I left a message with the appointment date and time.   [phone]

## 2021-09-03 ENCOUNTER — Ambulatory Visit: Payer: Self-pay | Admitting: *Deleted

## 2021-09-03 NOTE — Telephone Encounter (Signed)
Noted  

## 2021-09-03 NOTE — Telephone Encounter (Signed)
Pt husband on DPR with community case manager, [place] with husband and  given lab results per notes of Z. Raul Del, [place] from 08/25/21 on 09/03/21. Pt husband and interpreter verbalized understanding to increase atorvastatin to 40 mg daily or 2 tablets of current medication for cholesterol. Patient's husband requesting to know if he can pick up patient's medications for 6 months on August 13th 2023, due to leaving the country for 6  months. Please call to confirm [phone]. Husband reports he received letter for GI for patient and verbalized understanding he needs to call GI to set up appt time. Instructed patient's husband next time in office to add [facility] to [facility] as interpreter if needed.

## 2021-10-05 NOTE — Progress Notes (Unsigned)
Follow Up Note  RE: Tina [name] MRN: [id number] DOB: 1955-03-19 Date of Office Visit: 10/06/2021  Referring provider: Gildardo Pounds, NP Primary care provider: Gildardo Pounds, NP  Chief Complaint: No chief complaint on file.  History of Present Illness: I had the pleasure of seeing Tina [name] for a follow up visit at the [facility] on 10/05/2021. She is a 66 y.o. female, who is being followed for asthma and allergic rhinoconjunctivitis. Her previous allergy office visit was on 07/02/2021 with Dr. Maudie Mercury. Today is a regular follow up visit.  Moderate persistent asthma without complication Symptoms flared the past week. Today's spirometry showed some restriction.  Daily controller medication(s):  START Trelegy 264mg 1 puff once a day and rinse mouth after each use. Sample given and demonstrated proper use.  Do not use Advair when using this.  After finished with Trelegy then restart Advair 1058m 1 puff twice a day and rinse mouth after each use.  Spacer given and demonstrated proper use with inhaler. Patient understood technique and all questions/concerned were addressed.  May use albuterol rescue inhaler 2 puffs every 4 to 6 hours as needed for shortness of breath, chest tightness, coughing, and wheezing. May use albuterol rescue inhaler 2 puffs 5 to 15 minutes prior to strenuous physical activities. Monitor frequency of use.  Get spirometry at next visit.   Seasonal and perennial allergic rhinoconjunctivitis Past history - 2018 skin testing was positive to tree, mold, dust mite. Medicated nasal sprays will not be used due to perceived side effects. Interim history - itchy eyes. Continue environmental control measures. Continue Singulair (montelukast) '10mg'$  daily at night. May use over the counter antihistamines such as Zyrtec (cetirizine), Claritin (loratadine), Allegra (fexofenadine), or Xyzal (levocetirizine) daily as needed. Use olopatadine eye drops 0.7% once a  day as needed for itchy/watery eyes. Sample given.   Return in about 3 months (around 10/01/2021).    Assessment and Plan: Tina [name] is a [age].o. female with: No problem-specific Assessment & Plan notes found for this encounter.  No follow-ups on file.  No orders of the defined types were placed in this encounter.  Lab Orders  No laboratory test(s) ordered today    Diagnostics: Spirometry:  Tracings reviewed. Her effort: {Blank single:19197::"Good reproducible efforts.","It was hard to get consistent efforts and there is a question as to whether this reflects a maximal maneuver.","Poor effort, data can not be interpreted."} FVC: ***L FEV1: ***L, ***% predicted FEV1/FVC ratio: ***% Interpretation: {Blank single:19197::"Spirometry consistent with mild obstructive disease","Spirometry consistent with moderate obstructive disease","Spirometry consistent with severe obstructive disease","Spirometry consistent with possible restrictive disease","Spirometry consistent with mixed obstructive and restrictive disease","Spirometry uninterpretable due to technique","Spirometry consistent with normal pattern","No overt abnormalities noted given today's efforts"}.  Please see scanned spirometry results for details.  Skin Testing: {Blank single:19197::"Select foods","Environmental allergy panel","Environmental allergy panel and select foods","Food allergy panel","None","Deferred due to recent antihistamines use"}. *** Results discussed with patient/family.   Medication List:  Current Outpatient Medications  Medication Sig Dispense Refill   albuterol (VENTOLIN HFA) 108 (90 Base) MCG/ACT inhaler Inhale 2 puffs into the lungs every 4 (four) hours as needed for wheezing or shortness of breath (coughing fits). 36 g 1   atorvastatin (LIPITOR) 40 MG tablet Take 1 tablet (40 mg total) by mouth daily. FOR CHOLESTEROL 60 tablet 0   diclofenac Sodium (VOLTAREN) 1 % GEL Apply 2 g topically 4 (four) times daily.  200 g 3   ferrous gluconate (FERGON) 324 MG tablet Take 1  tablet (324 mg total) by mouth daily. FOR IRON or ANEMIA 60 tablet 0   fluticasone-salmeterol (ADVAIR DISKUS) 100-50 MCG/ACT AEPB Inhale 1 puff into the lungs 2 (two) times daily. Rinse mouth after each use. 360 each 0   ibuprofen (ADVIL) 600 MG tablet Take 1 tablet (600 mg total) by mouth every 8 (eight) hours as needed. 60 tablet 1   metFORMIN (GLUCOPHAGE) 500 MG tablet Take 1 tablet (500 mg total) by mouth 2 (two) times daily with a meal. FOR DIABETES 120 tablet 0   mometasone (ELOCON) 0.1 % cream Apply topically daily. 60 g 2   montelukast (SINGULAIR) 10 MG tablet Take 1 tablet (10 mg total) by mouth at bedtime. 180 tablet 0   No current facility-administered medications for this visit.   Allergies: Allergies  Allergen Reactions   Hctz [Hydrochlorothiazide] Shortness Of Breath   Lisinopril Shortness Of Breath   Sodium Bicarbonate Tablets [Sodium Bicarbonate] Palpitations   I reviewed her past medical history, social history, family history, and environmental history and no significant changes have been reported from her previous visit.  Review of Systems  Constitutional:  Negative for appetite change, chills, fever and unexpected weight change.  HENT:  Negative for congestion and rhinorrhea.   Eyes:  Positive for itching.  Respiratory:  Negative for cough, chest tightness, shortness of breath and wheezing.   Gastrointestinal:  Negative for abdominal pain.  Skin:  Negative for rash.  Allergic/Immunologic: Positive for environmental allergies.  Neurological:  Negative for headaches.    Objective: There were no vitals taken for this visit. There is no height or weight on file to calculate BMI. Physical Exam Vitals and nursing note reviewed.  Constitutional:      Appearance: Normal appearance. She is well-developed.  HENT:     Head: Normocephalic and atraumatic.     Right Ear: Tympanic membrane and external ear normal.      Left Ear: Tympanic membrane and external ear normal.     Nose: Nose normal.     Mouth/Throat:     Mouth: Mucous membranes are moist.     Pharynx: Oropharynx is clear.  Eyes:     Conjunctiva/sclera: Conjunctivae normal.  Cardiovascular:     Rate and Rhythm: Normal rate and regular rhythm.     Heart sounds: Normal heart sounds. No murmur heard. Pulmonary:     Effort: Pulmonary effort is normal.     Breath sounds: Normal breath sounds. No wheezing, rhonchi or rales.  Musculoskeletal:     Cervical back: Neck supple.  Skin:    General: Skin is warm.     Findings: No rash.  Neurological:     Mental Status: She is alert and oriented to person, place, and time.  Psychiatric:        Behavior: Behavior normal.    Previous notes and tests were reviewed. The plan was reviewed with the patient/family, and all questions/concerned were addressed.  It was my pleasure to see Shalandra today and participate in her care. Please feel free to contact me with any questions or concerns.  Sincerely,  Rexene Alberts, DO Allergy & Immunology  [facility] office: [place] office: [phone]

## 2021-10-06 ENCOUNTER — Encounter: Payer: Self-pay | Admitting: Allergy

## 2021-10-06 ENCOUNTER — Other Ambulatory Visit: Payer: Self-pay | Admitting: Nurse Practitioner

## 2021-10-06 ENCOUNTER — Ambulatory Visit (INDEPENDENT_AMBULATORY_CARE_PROVIDER_SITE_OTHER): Payer: Medicare Other | Admitting: Allergy

## 2021-10-06 VITALS — BP 122/68 | HR 67 | Temp 98.1°F | Ht <= 58 in | Wt 124.0 lb

## 2021-10-06 DIAGNOSIS — E785 Hyperlipidemia, unspecified: Secondary | ICD-10-CM

## 2021-10-06 DIAGNOSIS — D649 Anemia, unspecified: Secondary | ICD-10-CM

## 2021-10-06 DIAGNOSIS — H1010 Acute atopic conjunctivitis, unspecified eye: Secondary | ICD-10-CM

## 2021-10-06 DIAGNOSIS — H1013 Acute atopic conjunctivitis, bilateral: Secondary | ICD-10-CM

## 2021-10-06 DIAGNOSIS — E1165 Type 2 diabetes mellitus with hyperglycemia: Secondary | ICD-10-CM

## 2021-10-06 DIAGNOSIS — J302 Other seasonal allergic rhinitis: Secondary | ICD-10-CM

## 2021-10-06 DIAGNOSIS — J4540 Moderate persistent asthma, uncomplicated: Secondary | ICD-10-CM | POA: Diagnosis not present

## 2021-10-06 MED ORDER — FLUTICASONE-SALMETEROL 100-50 MCG/ACT IN AEPB: 1.0000 | INHALATION_SPRAY | Freq: Two times a day (BID) | RESPIRATORY_TRACT | 0 refills | Status: DC

## 2021-10-06 MED ORDER — FERROUS GLUCONATE 324 (38 FE) MG PO TABS: 324.0000 mg | ORAL_TABLET | Freq: Every day | ORAL | 0 refills | Status: DC

## 2021-10-06 MED ORDER — ALBUTEROL SULFATE HFA 108 (90 BASE) MCG/ACT IN AERS: 2.0000 | INHALATION_SPRAY | RESPIRATORY_TRACT | 0 refills | Status: DC | PRN

## 2021-10-06 MED ORDER — METFORMIN HCL 500 MG PO TABS: 500.0000 mg | ORAL_TABLET | Freq: Two times a day (BID) | ORAL | 0 refills | Status: DC

## 2021-10-06 MED ORDER — MONTELUKAST SODIUM 10 MG PO TABS: 10.0000 mg | ORAL_TABLET | Freq: Every day | ORAL | 0 refills | Status: DC

## 2021-10-06 MED ORDER — ATORVASTATIN CALCIUM 40 MG PO TABS: 40.0000 mg | ORAL_TABLET | Freq: Every day | ORAL | 0 refills | Status: DC

## 2021-10-06 MED ORDER — TRELEGY ELLIPTA 200-62.5-25 MCG/ACT IN AEPB: 1.0000 | INHALATION_SPRAY | Freq: Every day | RESPIRATORY_TRACT | 0 refills | Status: DC

## 2021-10-06 NOTE — Assessment & Plan Note (Addendum)
Doing well with below regimen. Going to [place] for 6 months. . Today's spirometry was unremarkable. . Letter written to allow inhalers on carry on bag while traveling to [place]. . Daily controller medication(s):  o Continue Advair 189mg 1 puff twice a day and rinse mouth after each use - requesting 6 months supply. . During an asthma flare: o Take Trelegy 2056m 1 puff once a day during asthma flares for 1-2 weeks instead of Advair. Sample given. . May use albuterol rescue inhaler 2 puffs every 4 to 6 hours as needed for shortness of breath, chest tightness, coughing, and wheezing. May use albuterol rescue inhaler 2 puffs 5 to 15 minutes prior to strenuous physical activities. Monitor frequency of use.   Get spirometry at next visit.

## 2021-10-06 NOTE — Assessment & Plan Note (Signed)
Past history - 2018 skin testing was positive to tree, mold, dust mite. Medicated nasal sprays will not be used due to perceived side effects. Interim history - stable.  Continue environmental control measures.  Continue Singulair (montelukast) '10mg'$  daily at night.  May use over the counter antihistamines such as Zyrtec (cetirizine), Claritin (loratadine), Allegra (fexofenadine), or Xyzal (levocetirizine) daily as needed.  Use olopatadine eye drops 0.7% once a day as needed for itchy/watery eyes.

## 2021-10-06 NOTE — Patient Instructions (Addendum)
Asthma: Make sure to take your inhalers in your carry on bag. Letter written. Daily controller medication(s):  Continue Advair 169mg 1 puff twice a day and rinse mouth after each use. Will request 6 months supply but if not covered you may need to call your insurance company. During an asthma flare: Take Trelegy 2062m 1 puff once a day during asthma flares for 1-2 weeks instead of Advair. Sample given. May use albuterol rescue inhaler 2 puffs every 4 to 6 hours as needed for shortness of breath, chest tightness, coughing, and wheezing. May use albuterol rescue inhaler 2 puffs 5 to 15 minutes prior to strenuous physical activities. Monitor frequency of use.  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 Continue environmental control measures. Continue Singulair (montelukast) '10mg'$  daily at night. May use over the counter antihistamines such as Zyrtec (cetirizine), Claritin (loratadine), Allegra (fexofenadine), or Xyzal (levocetirizine) daily as needed. Use olopatadine eye drops 0.7% once a day as needed for itchy/watery eyes.  Follow up in 6 months or sooner if needed.

## 2021-10-20 ENCOUNTER — Other Ambulatory Visit: Payer: Self-pay | Admitting: Nurse Practitioner

## 2021-10-27 ENCOUNTER — Ambulatory Visit: Payer: Medicare Other | Admitting: Allergy

## 2022-01-05 ENCOUNTER — Encounter: Payer: Self-pay | Admitting: *Deleted

## 2022-01-05 NOTE — Progress Notes (Signed)
[facility] Quality Team Note  Name: Tina [name] Date of Birth: 1955-12-07 MRN: [id number] Date: 01/05/2022  [facility] Quality Team has reviewed this patient's chart, please see recommendations below:  [facility] Quality Other; (Pt has open gaps for diabetic retinopathy screening and mammogram.  Order was placed for mammo 05/2021 but pt no record of completion yet.  )

## 2022-05-03 NOTE — Progress Notes (Signed)
Follow Up Note  RE: Tina [name] MRN: [id number] DOB: 1956-02-27 Date of Office Visit: 05/04/2022  Referring provider: Claiborne Rigg, NP Primary care provider: Claiborne Rigg, NP  Chief Complaint: Asthma  History of Present Illness: I had the pleasure of seeing Tina [name] for a follow up visit at the [facility] on 05/04/2022. She is a 67 y.o. female, who is being followed for asthma and allergic rhinoconjunctivitis. Her previous allergy office visit was on 10/06/2021 with Dr. Selena Batten. Today is a regular follow up visit. [employer] interpreter present in person.  She is accompanied today by her spouse who provided/contributed to the history.   Even with the interpreter history is not very clear. Will simplify her asthma regimen to not create any confusions.   Patient just got back from spending 6 months in [place]. Change in location did not flare her asthma symptoms.  Moderate persistent asthma Denies any SOB, coughing, wheezing, chest tightness, nocturnal awakenings, ER/urgent care visits or prednisone use since the last visit.  Still taking Advair 1 puff twice a day with good benefit and using albuterol about once per week.   She went to [place] and went to see a doctor there once for check up and they told her they didn't find any signs of asthma?  Seasonal and perennial allergic rhinoconjunctivitis She still has some nasal congestion at times.  She tried some type of nasal spray which gave her nosebleeds in the past.  Currently using some type of ginger nasal spray as needed. She is not taking Singulair or antihistamines.   Assessment and Plan: [name] is a 67 y.o. female with: Moderate persistent asthma without complication Doing well with below regimen. No flares while she was in [place] for 6 months.  Today's spirometry was not interpretable due to poor effort.  Daily controller medication(s): Continue Advair 1 puff twice a day and rinse mouth  after each use. May use albuterol rescue inhaler 2 puffs every 4 to 6 hours as needed for shortness of breath, chest tightness, coughing, and wheezing. May use albuterol rescue inhaler 2 puffs 5 to 15 minutes prior to strenuous physical activities. Monitor frequency of use.  Get spirometry at next visit.  Seasonal and perennial allergic rhinoconjunctivitis Past history - 2018 skin testing was positive to tree, mold, dust mite. Medicated nasal sprays will not be used due to perceived side effects. Interim history - nasal congestion and using some type of ginger nasal spray.  Continue environmental control measures. Use Nasacort (triamcinolone) nasal spray 1 spray per nostril twice a day as needed for nasal congestion. Sample given. Patient willing to try nasal spray now. Demonstrated proper use.  Start Singulair (montelukast) 10mg  daily at night as needed.  Cautioned that in some children/adults can experience behavioral changes including hyperactivity, agitation, depression, sleep disturbances and suicidal ideations. These side effects are rare, but if you notice them you should notify me and discontinue Singulair (montelukast). May use over the counter antihistamines such as Zyrtec (cetirizine), Claritin (loratadine), Allegra (fexofenadine), or Xyzal (levocetirizine) daily as needed. Use olopatadine eye drops 0.7% once a day as needed for itchy/watery eyes.  Xerosis of skin Complaining of dry skin. Recommended daily moisturization and proper skin care. This does NOT need topical steroid creams.   Return in about 4 months (around 09/02/2022).  Meds ordered this encounter  Medications   albuterol (VENTOLIN HFA) 108 (90 Base) MCG/ACT inhaler    Sig: Inhale 2 puffs into the lungs  every 4 (four) hours as needed for wheezing or shortness of breath (coughing fits).    Dispense:  18 g    Refill:  1   fluticasone-salmeterol (ADVAIR DISKUS) 100-50 MCG/ACT AEPB    Sig: Inhale 1 puff into the lungs  2 (two) times daily. Rinse mouth after each use.    Dispense:  60 each    Refill:  5   montelukast (SINGULAIR) 10 MG tablet    Sig: Take 1 tablet (10 mg total) by mouth at bedtime.    Dispense:  30 tablet    Refill:  5   Lab Orders  No laboratory test(s) ordered today    Diagnostics: Spirometry:  Tracings reviewed. Her effort: Poor effort, data can not be interpreted.  Medication List:  Current Outpatient Medications  Medication Sig Dispense Refill   albuterol (VENTOLIN HFA) 108 (90 Base) MCG/ACT inhaler Inhale 2 puffs into the lungs every 4 (four) hours as needed for wheezing or shortness of breath (coughing fits). 18 g 1   diclofenac Sodium (VOLTAREN) 1 % GEL Apply 2 g topically 4 (four) times daily. 200 g 3   ibuprofen (ADVIL) 600 MG tablet Take 1 tablet (600 mg total) by mouth every 8 (eight) hours as needed. 60 tablet 1   montelukast (SINGULAIR) 10 MG tablet Take 1 tablet (10 mg total) by mouth at bedtime. 30 tablet 5   atorvastatin (LIPITOR) 40 MG tablet Take 1 tablet (40 mg total) by mouth daily. FOR CHOLESTEROL 180 tablet 0   ferrous gluconate (FERGON) 324 MG tablet Take 1 tablet (324 mg total) by mouth daily. FOR IRON or ANEMIA 180 tablet 0   fluticasone-salmeterol (ADVAIR DISKUS) 100-50 MCG/ACT AEPB Inhale 1 puff into the lungs 2 (two) times daily. Rinse mouth after each use. 60 each 5   metFORMIN (GLUCOPHAGE) 500 MG tablet Take 1 tablet (500 mg total) by mouth 2 (two) times daily with a meal. FOR DIABETES 360 tablet 0   No current facility-administered medications for this visit.   Allergies: Allergies  Allergen Reactions   Hctz [Hydrochlorothiazide] Shortness Of Breath   Lisinopril Shortness Of Breath   Sodium Bicarbonate Tablets [Sodium Bicarbonate] Palpitations   I reviewed her past medical history, social history, family history, and environmental history and no significant changes have been reported from her previous visit.  Review of Systems  Constitutional:   Negative for appetite change, chills, fever and unexpected weight change.  HENT:  Positive for congestion. Negative for rhinorrhea.   Eyes:  Negative for itching.  Respiratory:  Negative for cough, chest tightness, shortness of breath and wheezing.   Gastrointestinal:  Negative for abdominal pain.  Skin:  Negative for rash.       dry  Allergic/Immunologic: Positive for environmental allergies.  Neurological:  Negative for headaches.    Objective: BP 120/84   Pulse 66   Temp (!) 96 F (35.6 C)   Resp 16   Ht 4\' 9"  (1.448 m)   Wt 124 lb (56.2 kg)   SpO2 98%   BMI 26.83 kg/m  Body mass index is 26.83 kg/m. Physical Exam Vitals and nursing note reviewed.  Constitutional:      Appearance: Normal appearance. She is well-developed.  HENT:     Head: Normocephalic and atraumatic.     Right Ear: Tympanic membrane and external ear normal.     Left Ear: Tympanic membrane and external ear normal.     Nose: Congestion present.     Mouth/Throat:  Mouth: Mucous membranes are moist.     Pharynx: Oropharynx is clear.  Eyes:     Conjunctiva/sclera: Conjunctivae normal.  Cardiovascular:     Rate and Rhythm: Normal rate and regular rhythm.     Heart sounds: Normal heart sounds. No murmur heard. Pulmonary:     Effort: Pulmonary effort is normal.     Breath sounds: Normal breath sounds. No wheezing, rhonchi or rales.  Musculoskeletal:     Cervical back: Neck supple.  Skin:    General: Skin is warm and dry.     Findings: No rash.  Neurological:     Mental Status: She is alert and oriented to person, place, and time.  Psychiatric:        Behavior: Behavior normal.    Previous notes and tests were reviewed. The plan was reviewed with the patient/family, and all questions/concerned were addressed.  It was my pleasure to see Tina [name] today and participate in her care. Please feel free to contact me with any questions or concerns.  Sincerely,  Wyline Mood, DO Allergy &  Immunology  [facility] office: [phone] [facility] office: [phone]

## 2022-05-04 ENCOUNTER — Other Ambulatory Visit: Payer: Self-pay

## 2022-05-04 ENCOUNTER — Encounter: Payer: Self-pay | Admitting: Allergy

## 2022-05-04 ENCOUNTER — Ambulatory Visit (INDEPENDENT_AMBULATORY_CARE_PROVIDER_SITE_OTHER): Payer: 59 | Admitting: Allergy

## 2022-05-04 VITALS — BP 120/84 | HR 66 | Temp 96.0°F | Resp 16 | Ht <= 58 in | Wt 124.0 lb

## 2022-05-04 DIAGNOSIS — J4540 Moderate persistent asthma, uncomplicated: Secondary | ICD-10-CM

## 2022-05-04 DIAGNOSIS — H1010 Acute atopic conjunctivitis, unspecified eye: Secondary | ICD-10-CM

## 2022-05-04 DIAGNOSIS — L853 Xerosis cutis: Secondary | ICD-10-CM | POA: Diagnosis not present

## 2022-05-04 DIAGNOSIS — J302 Other seasonal allergic rhinitis: Secondary | ICD-10-CM | POA: Diagnosis not present

## 2022-05-04 DIAGNOSIS — H1013 Acute atopic conjunctivitis, bilateral: Secondary | ICD-10-CM | POA: Diagnosis not present

## 2022-05-04 MED ORDER — FLUTICASONE-SALMETEROL 100-50 MCG/ACT IN AEPB: 1.0000 | INHALATION_SPRAY | Freq: Two times a day (BID) | RESPIRATORY_TRACT | 5 refills | Status: DC

## 2022-05-04 MED ORDER — MONTELUKAST SODIUM 10 MG PO TABS: 10.0000 mg | ORAL_TABLET | Freq: Every day | ORAL | 5 refills | Status: DC

## 2022-05-04 MED ORDER — ALBUTEROL SULFATE HFA 108 (90 BASE) MCG/ACT IN AERS: 2.0000 | INHALATION_SPRAY | RESPIRATORY_TRACT | 1 refills | Status: DC | PRN

## 2022-05-04 NOTE — Patient Instructions (Addendum)
Asthma: Daily controller medication(s):  Continue Advair 141mg 1 puff twice a day and rinse mouth after each use. May use albuterol rescue inhaler 2 puffs every 4 to 6 hours as needed for shortness of breath, chest tightness, coughing, and wheezing. May use albuterol rescue inhaler 2 puffs 5 to 15 minutes prior to strenuous physical activities. Monitor frequency of use.  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 Continue environmental control measures. Use Nasacort (triamcinolone) nasal spray 1 spray per nostril twice a day as needed for nasal congestion. Sample given. Start Singulair (montelukast) '10mg'$  daily at night as needed.  Cautioned that in some children/adults can experience behavioral changes including hyperactivity, agitation, depression, sleep disturbances and suicidal ideations. These side effects are rare, but if you notice them you should notify me and discontinue Singulair (montelukast).  May use over the counter antihistamines such as Zyrtec (cetirizine), Claritin (loratadine), Allegra (fexofenadine), or Xyzal (levocetirizine) daily as needed. Use olopatadine eye drops 0.7% once a day as needed for itchy/watery eyes.  Dry skin Moisturize daily.  Follow up in 4 months or sooner if needed.

## 2022-05-04 NOTE — Assessment & Plan Note (Signed)
Complaining of dry skin. Recommended daily moisturization and proper skin care. This does NOT need topical steroid creams.

## 2022-05-04 NOTE — Assessment & Plan Note (Signed)
Past history - 2018 skin testing was positive to tree, mold, dust mite. Medicated nasal sprays will not be used due to perceived side effects. Interim history - nasal congestion and using some type of ginger nasal spray.  Continue environmental control measures. Use Nasacort (triamcinolone) nasal spray 1 spray per nostril twice a day as needed for nasal congestion. Sample given. Patient willing to try nasal spray now. Demonstrated proper use.  Start Singulair (montelukast) '10mg'$  daily at night as needed.  Cautioned that in some children/adults can experience behavioral changes including hyperactivity, agitation, depression, sleep disturbances and suicidal ideations. These side effects are rare, but if you notice them you should notify me and discontinue Singulair (montelukast). May use over the counter antihistamines such as Zyrtec (cetirizine), Claritin (loratadine), Allegra (fexofenadine), or Xyzal (levocetirizine) daily as needed. Use olopatadine eye drops 0.7% once a day as needed for itchy/watery eyes.

## 2022-05-04 NOTE — Assessment & Plan Note (Signed)
Doing well with below regimen. No flares while she was in [place] for 6 months.  Today's spirometry was not interpretable due to poor effort.  Daily controller medication(s): Continue Advair 143mg 1 puff twice a day and rinse mouth after each use. May use albuterol rescue inhaler 2 puffs every 4 to 6 hours as needed for shortness of breath, chest tightness, coughing, and wheezing. May use albuterol rescue inhaler 2 puffs 5 to 15 minutes prior to strenuous physical activities. Monitor frequency of use.  Get spirometry at next visit.

## 2022-05-15 ENCOUNTER — Ambulatory Visit: Payer: 59

## 2022-05-29 ENCOUNTER — Ambulatory Visit: Payer: 59 | Attending: Nurse Practitioner | Admitting: Nurse Practitioner

## 2022-05-29 ENCOUNTER — Encounter: Payer: Self-pay | Admitting: Nurse Practitioner

## 2022-05-29 VITALS — BP 134/72 | HR 73 | Ht <= 58 in | Wt 119.6 lb

## 2022-05-29 DIAGNOSIS — E785 Hyperlipidemia, unspecified: Secondary | ICD-10-CM | POA: Diagnosis not present

## 2022-05-29 DIAGNOSIS — L853 Xerosis cutis: Secondary | ICD-10-CM

## 2022-05-29 DIAGNOSIS — Z1211 Encounter for screening for malignant neoplasm of colon: Secondary | ICD-10-CM

## 2022-05-29 DIAGNOSIS — E1165 Type 2 diabetes mellitus with hyperglycemia: Secondary | ICD-10-CM

## 2022-05-29 DIAGNOSIS — M25511 Pain in right shoulder: Secondary | ICD-10-CM

## 2022-05-29 DIAGNOSIS — G8929 Other chronic pain: Secondary | ICD-10-CM

## 2022-05-29 DIAGNOSIS — Z9189 Other specified personal risk factors, not elsewhere classified: Secondary | ICD-10-CM | POA: Diagnosis not present

## 2022-05-29 DIAGNOSIS — Z23 Encounter for immunization: Secondary | ICD-10-CM | POA: Diagnosis not present

## 2022-05-29 DIAGNOSIS — D649 Anemia, unspecified: Secondary | ICD-10-CM

## 2022-05-29 LAB — POCT GLYCOSYLATED HEMOGLOBIN (HGB A1C): Hemoglobin A1C: 7 % — AB (ref 4.0–5.6)

## 2022-05-29 MED ORDER — DICLOFENAC SODIUM 1 % EX GEL: 2.0000 g | Freq: Four times a day (QID) | CUTANEOUS | 3 refills | Status: DC

## 2022-05-29 MED ORDER — BETAMETHASONE DIPROPIONATE 0.05 % EX CREA: TOPICAL_CREAM | Freq: Two times a day (BID) | CUTANEOUS | 1 refills | Status: DC

## 2022-05-29 MED ORDER — ATORVASTATIN CALCIUM 40 MG PO TABS: 40.0000 mg | ORAL_TABLET | Freq: Every day | ORAL | 1 refills | Status: DC

## 2022-05-29 MED ORDER — FERROUS GLUCONATE 324 (38 FE) MG PO TABS: 324.0000 mg | ORAL_TABLET | Freq: Every day | ORAL | 1 refills | Status: AC

## 2022-05-29 MED ORDER — METFORMIN HCL 500 MG PO TABS: 500.0000 mg | ORAL_TABLET | Freq: Two times a day (BID) | ORAL | 1 refills | Status: DC

## 2022-05-29 NOTE — Progress Notes (Signed)
Assessment & Plan:  Tina [name] was seen today for diabetes.  Diagnoses and all orders for this visit:  Type 2 diabetes mellitus with hyperglycemia, without long-term current use of insulin (HCC) -     POCT glycosylated hemoglobin (Hb A1C) -     metFORMIN (GLUCOPHAGE) 500 MG tablet; Take 1 tablet (500 mg total) by mouth 2 (two) times daily with a meal. FOR DIABETES -     Microalbumin/Creatinine Ratio, Urine -     CMP14+EGFR  Dyslipidemia, goal LDL below 100 -     atorvastatin (LIPITOR) 40 MG tablet; Take 1 tablet (40 mg total) by mouth daily. FOR CHOLESTEROL -     Lipid panel  Chronic right shoulder pain Symptoms plateaued -     diclofenac Sodium (VOLTAREN) 1 % GEL; Apply 2 g topically 4 (four) times daily. For joint/bone pain  Anemia, unspecified type -     ferrous gluconate (FERGON) 324 MG tablet; Take 1 tablet (324 mg total) by mouth daily. FOR IRON or ANEMIA -     CBC with Differential  Need for influenza vaccination -     Flu Vaccine QUAD High Dose(Fluad)  At risk for decreased bone density -     DG Bone Density; Future  Colon cancer screening -     Ambulatory referral to Gastroenterology  Xerosis of skin -     betamethasone dipropionate 0.05 % cream; Apply topically 2 (two) times daily. Apply to right leg/rash    Patient has been counseled on age-appropriate routine health concerns for screening and prevention. These are reviewed and up-to-date. Referrals have been placed accordingly. Immunizations are up-to-date or declined.    Subjective:   Chief Complaint  Patient presents with   Diabetes   HPI Tina [name] 67 y.o. female presents to office today for follow up to DM She is accompanied by an onsite interpreter. Has been in [place] over the past 6 months. Some of her medications she has are from [place] and she has been instructed to stop these and resume her medications ordered by me her PCP.  She has a past medical history of Asthma, Diabetes mellitus without  complication ([place]), and Nephrolithiasis.     DM 2 A1c slightly increased. Will resume metformin 500 mg BID now that she has returned from [place]. She does not monitor her blood glucose levels. LDL not quite at goal with atorvastatin 20 mg daily. Lab Results  Component Value Date   HGBA1C 7.0 (A) 05/29/2022    Lab Results  Component Value Date   LDLCALC 97 08/22/2021    Asthma Controlled with advair daily use and prn use of albuterol inhaler. She denies cough, wheezing or shortness of breath.   Rash Pt c/o chronic itching and rash. Controlled with steroid cream.   Rash involves B/L LE.  No known food allergies.   Review of Systems  Constitutional:  Negative for fever, malaise/fatigue and weight loss.  HENT: Negative.  Negative for nosebleeds.   Eyes: Negative.  Negative for blurred vision, double vision and photophobia.  Respiratory: Negative.  Negative for cough and shortness of breath.   Cardiovascular: Negative.  Negative for chest pain, palpitations and leg swelling.  Gastrointestinal: Negative.  Negative for heartburn, nausea and vomiting.  Musculoskeletal: Negative.  Negative for myalgias.  Skin:  Positive for itching and rash.       Chronic. Stable. No diabetic ulcers  Neurological: Negative.  Negative for dizziness, focal weakness, seizures and headaches.  Psychiatric/Behavioral: Negative.  Negative for suicidal  ideas.     Past Medical History:  Diagnosis Date   Asthma    Diabetes mellitus without complication ([place])    Nephrolithiasis    "came out on it's own" (12/21/2012)    Past Surgical History:  Procedure Laterality Date   CHOLECYSTECTOMY N/A 12/21/2012   Procedure: LAPAROSCOPIC CHOLECYSTECTOMY;  Surgeon: Ralene Ok, MD;  Location: [facility];  Service: General;  Laterality: N/A;   KIDNEY STONE SURGERY     spouse denies this hx on 12/21/2012   LAPAROSCOPIC CHOLECYSTECTOMY  12/21/2012    Family History  Problem Relation Age of Onset   Tuberculosis Mother     Asthma Mother    Asthma Father    Tuberculosis Father     Social History Reviewed with no changes to be made today.   Outpatient Medications Prior to Visit  Medication Sig Dispense Refill   albuterol (VENTOLIN HFA) 108 (90 Base) MCG/ACT inhaler Inhale 2 puffs into the lungs every 4 (four) hours as needed for wheezing or shortness of breath (coughing fits). 18 g 1   fluticasone-salmeterol (ADVAIR DISKUS) 100-50 MCG/ACT AEPB Inhale 1 puff into the lungs 2 (two) times daily. Rinse mouth after each use. 60 each 5   ibuprofen (ADVIL) 600 MG tablet Take 1 tablet (600 mg total) by mouth every 8 (eight) hours as needed. 60 tablet 1   montelukast (SINGULAIR) 10 MG tablet Take 1 tablet (10 mg total) by mouth at bedtime. 30 tablet 5   atorvastatin (LIPITOR) 40 MG tablet Take 1 tablet (40 mg total) by mouth daily. FOR CHOLESTEROL 180 tablet 0   diclofenac Sodium (VOLTAREN) 1 % GEL Apply 2 g topically 4 (four) times daily. 200 g 3   ferrous gluconate (FERGON) 324 MG tablet Take 1 tablet (324 mg total) by mouth daily. FOR IRON or ANEMIA 180 tablet 0   metFORMIN (GLUCOPHAGE) 500 MG tablet Take 1 tablet (500 mg total) by mouth 2 (two) times daily with a meal. FOR DIABETES 360 tablet 0   No facility-administered medications prior to visit.    Allergies  Allergen Reactions   Hctz [Hydrochlorothiazide] Shortness Of Breath   Lisinopril Shortness Of Breath   Sodium Bicarbonate Tablets [Sodium Bicarbonate] Palpitations       Objective:    BP 134/72   Pulse 73   Ht 4\' 9"  (1.448 m)   Wt 119 lb 9.6 oz (54.3 kg)   SpO2 97%   BMI 25.88 kg/m  Wt Readings from Last 3 Encounters:  05/29/22 119 lb 9.6 oz (54.3 kg)  05/04/22 124 lb (56.2 kg)  10/06/21 124 lb (56.2 kg)    Physical Exam Vitals and nursing note reviewed.  Constitutional:      Appearance: She is well-developed.  HENT:     Head: Normocephalic and atraumatic.  Cardiovascular:     Rate and Rhythm: Normal rate and regular rhythm.      Heart sounds: Normal heart sounds. No murmur heard.    No friction rub. No gallop.  Pulmonary:     Effort: Pulmonary effort is normal. No tachypnea or respiratory distress.     Breath sounds: Normal breath sounds. No decreased breath sounds, wheezing, rhonchi or rales.  Chest:     Chest wall: No tenderness.  Abdominal:     General: Bowel sounds are normal.     Palpations: Abdomen is soft.  Musculoskeletal:        General: Normal range of motion.     Cervical back: Normal range of motion.  Skin:  General: Skin is warm and dry.  Neurological:     Mental Status: She is alert and oriented to person, place, and time.     Coordination: Coordination normal.  Psychiatric:        Behavior: Behavior normal. Behavior is cooperative.        Thought Content: Thought content normal.        Judgment: Judgment normal.          Patient has been counseled extensively about nutrition and exercise as well as the importance of adherence with medications and regular follow-up. The patient was given clear instructions to go to ER or return to medical center if symptoms don't improve, worsen or new problems develop. The patient verbalized understanding.   Follow-up: Return in about 3 months (around 08/29/2022).   Gildardo Pounds, FNP-BC [facility], [place]   05/29/2022, 6:38 PM

## 2022-05-31 LAB — CBC WITH DIFFERENTIAL/PLATELET
Basophils Absolute: 0.1 10*3/uL (ref 0.0–0.2)
Basos: 1 %
EOS (ABSOLUTE): 0.7 10*3/uL — ABNORMAL HIGH (ref 0.0–0.4)
Eos: 10 %
Hematocrit: 41.1 % (ref 34.0–46.6)
Hemoglobin: 13.3 g/dL (ref 11.1–15.9)
Immature Grans (Abs): 0 10*3/uL (ref 0.0–0.1)
Immature Granulocytes: 0 %
Lymphocytes Absolute: 2.3 10*3/uL (ref 0.7–3.1)
Lymphs: 35 %
MCH: 25 pg — ABNORMAL LOW (ref 26.6–33.0)
MCHC: 32.4 g/dL (ref 31.5–35.7)
MCV: 77 fL — ABNORMAL LOW (ref 79–97)
Monocytes Absolute: 0.5 10*3/uL (ref 0.1–0.9)
Monocytes: 8 %
Neutrophils Absolute: 3 10*3/uL (ref 1.4–7.0)
Neutrophils: 46 %
Platelets: 202 10*3/uL (ref 150–450)
RBC: 5.31 x10E6/uL — ABNORMAL HIGH (ref 3.77–5.28)
RDW: 14.9 % (ref 11.7–15.4)
WBC: 6.6 10*3/uL (ref 3.4–10.8)

## 2022-05-31 LAB — MICROALBUMIN / CREATININE URINE RATIO
Creatinine, Urine: 200.2 mg/dL
Microalb/Creat Ratio: 3 mg/g creat (ref 0–29)
Microalbumin, Urine: 5.8 ug/mL

## 2022-05-31 LAB — CMP14+EGFR
ALT: 26 IU/L (ref 0–32)
AST: 22 IU/L (ref 0–40)
Albumin/Globulin Ratio: 1.6 (ref 1.2–2.2)
Albumin: 4.4 g/dL (ref 3.9–4.9)
Alkaline Phosphatase: 82 IU/L (ref 44–121)
BUN/Creatinine Ratio: 18 (ref 12–28)
BUN: 16 mg/dL (ref 8–27)
Bilirubin Total: 0.3 mg/dL (ref 0.0–1.2)
CO2: 25 mmol/L (ref 20–29)
Calcium: 9.7 mg/dL (ref 8.7–10.3)
Chloride: 104 mmol/L (ref 96–106)
Creatinine, Ser: 0.9 mg/dL (ref 0.57–1.00)
Globulin, Total: 2.7 g/dL (ref 1.5–4.5)
Glucose: 117 mg/dL — ABNORMAL HIGH (ref 70–99)
Potassium: 4.2 mmol/L (ref 3.5–5.2)
Sodium: 143 mmol/L (ref 134–144)
Total Protein: 7.1 g/dL (ref 6.0–8.5)
eGFR: 70 mL/min/{1.73_m2} (ref >59)

## 2022-05-31 LAB — LIPID PANEL
Chol/HDL Ratio: 6 ratio — ABNORMAL HIGH (ref 0.0–4.4)
Cholesterol, Total: 257 mg/dL — ABNORMAL HIGH (ref 100–199)
HDL: 43 mg/dL (ref >39)
LDL Chol Calc (NIH): 142 mg/dL — ABNORMAL HIGH (ref 0–99)
Triglycerides: 391 mg/dL — ABNORMAL HIGH (ref 0–149)
VLDL Cholesterol Cal: 72 mg/dL — ABNORMAL HIGH (ref 5–40)

## 2022-06-02 ENCOUNTER — Other Ambulatory Visit: Payer: Self-pay | Admitting: Nurse Practitioner

## 2022-06-02 DIAGNOSIS — E785 Hyperlipidemia, unspecified: Secondary | ICD-10-CM

## 2022-06-02 MED ORDER — ATORVASTATIN CALCIUM 80 MG PO TABS: 80.0000 mg | ORAL_TABLET | Freq: Every day | ORAL | 3 refills | Status: DC

## 2022-06-11 ENCOUNTER — Telehealth: Payer: Self-pay | Admitting: Nurse Practitioner

## 2022-06-11 NOTE — Telephone Encounter (Signed)
Copied from [place] [id number]. Topic: General - Other >> Jun 11, 2022  3:06 PM Cyndi Bender wrote: Reason for CRM: Pt social worker Rudene Anda called for pt lab results. Rudene Anda stated pt received a letter and was asked to call the office for the lab results. Cb# [phone]

## 2022-06-12 NOTE — Telephone Encounter (Signed)
Mrs. Antrim husband identified patient by name and date of birth.  Patient aware of results and voiced understanding.

## 2022-06-17 ENCOUNTER — Encounter: Payer: Self-pay | Admitting: Pharmacist

## 2022-06-17 ENCOUNTER — Ambulatory Visit: Payer: 59 | Attending: Nurse Practitioner | Admitting: Pharmacist

## 2022-06-17 DIAGNOSIS — Z79899 Other long term (current) drug therapy: Secondary | ICD-10-CM

## 2022-06-17 NOTE — Progress Notes (Signed)
06/17/2022 Name: Tina [name] MRN: [id number] DOB: [birth year]  No chief complaint on file.  Tina [name] is a 67 y.o. year old female who presented for a telephone visit.   They were referred to the pharmacist by a quality report for assistance in managing medication access.   Patient is participating in a Managed Medicaid Plan:  No  Subjective:  Care Team: Primary Care Provider: Claiborne Rigg, NP ; Next Scheduled Visit: 7/12/202  Medication Access/Adherence  Current Pharmacy:  [id number] - [place] ([place] - [address] [place]) [place] [address] Phone: [phone] Fax: [phone]  Patient reports affordability concerns with their medications: No  Patient reports access/transportation concerns to their pharmacy: No  Patient reports adherence concerns with their medications:  No     Medication Management:  Current adherence strategy: fills 90-day supplies at her pharmacy.   Patient reports Good adherence to medications  Patient reports the following barriers to adherence: none  Recent fill dates:  For the medication classes failing measures: Fill date for atorvastatin: 06/16/2022 (90 day supply) Fill date for metformin: 05/29/2022 (90 day supply)   Objective:  Lab Results  Component Value Date   HGBA1C 7.0 (A) 05/29/2022    Lab Results  Component Value Date   CREATININE 0.90 05/29/2022   BUN 16 05/29/2022   NA 143 05/29/2022   K 4.2 05/29/2022   CL 104 05/29/2022   CO2 25 05/29/2022    Lab Results  Component Value Date   CHOL 257 (H) 05/29/2022   HDL 43 05/29/2022   LDLCALC 142 (H) 05/29/2022   TRIG 391 (H) 05/29/2022   CHOLHDL 6.0 (H) 05/29/2022    Medications Reviewed Today     Reviewed by Claiborne Rigg, NP (Nurse Practitioner) on 05/29/22 at 1849  Med List Status: <None>   Medication Order Taking? Sig Documenting Provider Last Dose Status Informant  albuterol (VENTOLIN HFA) 108 (90 Base)  MCG/ACT inhaler [id number] Yes Inhale 2 puffs into the lungs every 4 (four) hours as needed for wheezing or shortness of breath (coughing fits). Tina Sia, DO Taking Active   atorvastatin (LIPITOR) 40 MG tablet [id number]  Take 1 tablet (40 mg total) by mouth daily. FOR CHOLESTEROL Claiborne Rigg, NP  Active   betamethasone dipropionate 0.05 % cream [id number] Yes Apply topically 2 (two) times daily. Apply to right leg/rash Claiborne Rigg, NP  Active   diclofenac Sodium (VOLTAREN) 1 % GEL [id number]  Apply 2 g topically 4 (four) times daily. For joint/bone pain Claiborne Rigg, NP  Active   ferrous gluconate (FERGON) 324 MG tablet [id number]  Take 1 tablet (324 mg total) by mouth daily. FOR IRON or ANEMIA Claiborne Rigg, NP  Active   fluticasone-salmeterol (ADVAIR DISKUS) 100-50 MCG/ACT AEPB [id number] Yes Inhale 1 puff into the lungs 2 (two) times daily. Rinse mouth after each use. Tina Sia, DO Taking Active   ibuprofen (ADVIL) 600 MG tablet [id number] Yes Take 1 tablet (600 mg total) by mouth every 8 (eight) hours as needed. Claiborne Rigg, NP Taking Active   metFORMIN (GLUCOPHAGE) 500 MG tablet [id number]  Take 1 tablet (500 mg total) by mouth 2 (two) times daily with a meal. FOR DIABETES Claiborne Rigg, NP  Active   montelukast (SINGULAIR) 10 MG tablet [id number] Yes Take 1 tablet (10 mg total) by mouth at bedtime. Tina Sia, DO Taking Active   Med List Note Tina [name], Tina [name], CPhT  12/21/12 0714): Doesn't have a pharmacy she uses regularly            Assessment/Plan:   Medication Management: - Currently strategy sufficient to maintain appropriate adherence to prescribed medication regimen - Suggested use of weekly pill box to organize medications. Continue to get 90-day supplies from [facility] [place].  - Created list of medication, indication, and administration time. Provided to patient  Follow Up Plan: w/ PCP in July.  Butch Penny, PharmD, Patsy Baltimore, CPP Clinical  Pharmacist [facility] [phone]

## 2022-08-07 ENCOUNTER — Encounter: Payer: Self-pay | Admitting: Nurse Practitioner

## 2022-09-02 ENCOUNTER — Ambulatory Visit (INDEPENDENT_AMBULATORY_CARE_PROVIDER_SITE_OTHER): Payer: 59 | Admitting: Allergy

## 2022-09-02 ENCOUNTER — Encounter: Payer: Self-pay | Admitting: Allergy

## 2022-09-02 ENCOUNTER — Other Ambulatory Visit: Payer: Self-pay

## 2022-09-02 VITALS — BP 140/82 | HR 70 | Temp 98.3°F | Resp 16 | Ht <= 58 in | Wt 120.5 lb

## 2022-09-02 DIAGNOSIS — H1010 Acute atopic conjunctivitis, unspecified eye: Secondary | ICD-10-CM

## 2022-09-02 DIAGNOSIS — J3089 Other allergic rhinitis: Secondary | ICD-10-CM | POA: Diagnosis not present

## 2022-09-02 DIAGNOSIS — J4540 Moderate persistent asthma, uncomplicated: Secondary | ICD-10-CM

## 2022-09-02 DIAGNOSIS — H1013 Acute atopic conjunctivitis, bilateral: Secondary | ICD-10-CM

## 2022-09-02 DIAGNOSIS — J302 Other seasonal allergic rhinitis: Secondary | ICD-10-CM

## 2022-09-02 MED ORDER — TRELEGY ELLIPTA 100-62.5-25 MCG/ACT IN AEPB: 1.0000 | INHALATION_SPRAY | Freq: Every day | RESPIRATORY_TRACT | 5 refills | Status: DC

## 2022-09-02 NOTE — Patient Instructions (Addendum)
Asthma: Daily controller medication(s):  Start Trelegy 1 puff once a day and rinse mouth after each use. Stop other inhaler.  May use albuterol rescue inhaler 2 puffs or nebulizer every 4 to 6 hours as needed for shortness of breath, chest tightness, coughing, and wheezing. Monitor frequency of use - if you need to use it more than twice per week on a consistent basis let us know.  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 Continue environmental control measures. Stop montelukast.  Only use if needed:  Nasacort (triamcinolone) nasal spray 1 spray per nostril twice a day as needed for nasal congestion.  May use over the counter antihistamines such as Zyrtec (cetirizine), Claritin (loratadine), Allegra (fexofenadine), or Xyzal (levocetirizine) daily as needed. Olopatadine eye drops 0.7% once a day as needed for itchy/watery eyes.  Follow up in 6 months or sooner if needed.

## 2022-09-02 NOTE — Assessment & Plan Note (Signed)
Prefers Trelegy over Advair. Albuterol makes her throat scratchy. Today's spirometry was unremarkable given effort.  Daily controller medication(s):  Start Trelegy 1 puff once a day and rinse mouth after each use. Stop other inhaler.  May use albuterol rescue inhaler 2 puffs or nebulizer every 4 to 6 hours as needed for shortness of breath, chest tightness, coughing, and wheezing. Monitor frequency of use - if you need to use it more than twice per week on a consistent basis let us know.  Get spirometry at next visit.

## 2022-09-02 NOTE — Progress Notes (Signed)
Follow Up Note  RE: Tina [name] MRN: [id number] DOB: [birth year] Date of Office Visit: 09/02/2022  Referring provider: Claiborne Rigg, NP Primary care provider: Claiborne Rigg, NP  Chief Complaint: Asthma (Does not like the advair would like to switch back to trelegy )  History of Present Illness: I had the pleasure of seeing Tina [name] for a follow up visit at the [facility] on 09/02/2022. She is a 67 y.o. female, who is being followed for asthma, allergic rhinoconjunctivitis, xerosis. Her previous allergy office visit was on 05/04/2022 with Dr. Selena Batten. Today is a regular follow up visit. She is accompanied today by her husband who provided/contributed to the history. [place] interpreter present in person.  Even with the interpreter patient is not a good historian.  Moderate persistent asthma Currently taking Trelegy 1 puff once a day and sometimes switches to Generic Advair.   She doesn't think the Advair helps as much and would like to go back to Trelegy as her daily inhaler.  Not using albuterol because it makes her throat scratchy.   Denies any SOB, coughing, wheezing, chest tightness, nocturnal awakenings, ER/urgent care visits or prednisone use since the last visit.   Seasonal and perennial allergic rhinoconjunctivitis Not using any nasal sprays.  She is no longer on Singulair. She took Singulair last week and may have noticed some chest tightness so she stopped it.  Denies any symptoms.  Assessment and Plan: Chau is a 67 y.o. female with: Moderate persistent asthma without complication Prefers Trelegy over Advair. Albuterol makes her throat scratchy. Today's spirometry was unremarkable given effort.  Daily controller medication(s):  Start Trelegy 1 puff once a day and rinse mouth after each use. Stop other inhaler.  May use albuterol rescue inhaler 2 puffs or nebulizer every 4 to 6 hours as needed for shortness of breath, chest  tightness, coughing, and wheezing. Monitor frequency of use - if you need to use it more than twice per week on a consistent basis let us know.  Get spirometry at next visit.  Seasonal and perennial allergic rhinoconjunctivitis Past history - 2018 skin testing was positive to tree, mold, dust mite. Medicated nasal sprays will not be used due to perceived side effects. Interim history - asymptomatic and does not any meds. Didn't like Singulair.  Continue environmental control measures. Stop montelukast.  Only use if needed:  Nasacort (triamcinolone) nasal spray 1 spray per nostril twice a day as needed for nasal congestion.  May use over the counter antihistamines such as Zyrtec (cetirizine), Claritin (loratadine), Allegra (fexofenadine), or Xyzal (levocetirizine) daily as needed. Olopatadine eye drops 0.7% once a day as needed for itchy/watery eyes.  Return in about 6 months (around 03/04/2023).  Meds ordered this encounter  Medications   Fluticasone-Umeclidin-Vilant (TRELEGY ELLIPTA) 100-62.5-25 MCG/ACT AEPB    Sig: Inhale 1 puff into the lungs daily. Rinse mouth after each use.    Dispense:  60 each    Refill:  5   Lab Orders  No laboratory test(s) ordered today    Diagnostics: Spirometry:  Tracings reviewed. Her effort: It was hard to get consistent efforts and there is a question as to whether this reflects a maximal maneuver. FVC: 1.78L FEV1: 1.54L, 79% predicted FEV1/FVC ratio: 87% Interpretation: No overt abnormalities noted given today's efforts.  Please see scanned spirometry results for details.  Medication List:  Current Outpatient Medications  Medication Sig Dispense Refill   albuterol (VENTOLIN HFA) 108 (90 Base) MCG/ACT  inhaler Inhale 2 puffs into the lungs every 4 (four) hours as needed for wheezing or shortness of breath (coughing fits). 18 g 1   atorvastatin (LIPITOR) 80 MG tablet Take 1 tablet (80 mg total) by mouth daily. FOR CHOLESTEROL 90 tablet 3    betamethasone dipropionate 0.05 % cream Apply topically 2 (two) times daily. Apply to right leg/rash 60 g 1   diclofenac Sodium (VOLTAREN) 1 % GEL Apply 2 g topically 4 (four) times daily. For joint/bone pain 200 g 3   ferrous gluconate (FERGON) 324 MG tablet Take 1 tablet (324 mg total) by mouth daily. FOR IRON or ANEMIA 90 tablet 1   Fluticasone-Umeclidin-Vilant (TRELEGY ELLIPTA) 100-62.5-25 MCG/ACT AEPB Inhale 1 puff into the lungs daily. Rinse mouth after each use. 60 each 5   ibuprofen (ADVIL) 600 MG tablet Take 1 tablet (600 mg total) by mouth every 8 (eight) hours as needed. 60 tablet 1   metFORMIN (GLUCOPHAGE) 500 MG tablet Take 1 tablet (500 mg total) by mouth 2 (two) times daily with a meal. FOR DIABETES 180 tablet 1   No current facility-administered medications for this visit.   Allergies: Allergies  Allergen Reactions   Hctz [Hydrochlorothiazide] Shortness Of Breath   Lisinopril Shortness Of Breath   Sodium Bicarbonate Tablets [Sodium Bicarbonate] Palpitations   I reviewed her past medical history, social history, family history, and environmental history and no significant changes have been reported from her previous visit.  Review of Systems  Constitutional:  Negative for appetite change, chills, fever and unexpected weight change.  HENT:  Negative for congestion and rhinorrhea.   Eyes:  Negative for itching.  Respiratory:  Negative for cough, chest tightness, shortness of breath and wheezing.   Gastrointestinal:  Negative for abdominal pain.  Skin:  Negative for rash.  Allergic/Immunologic: Positive for environmental allergies.  Neurological:  Negative for headaches.    Objective: BP (!) 140/82   Pulse 70   Temp 98.3 F (36.8 C)   Resp 16   Ht 4' 9.48" (1.46 m)   Wt 120 lb 8 oz (54.7 kg)   SpO2 96%   BMI 25.64 kg/m  Body mass index is 25.64 kg/m. Physical Exam Vitals and nursing note reviewed.  Constitutional:      Appearance: Normal appearance. She is  well-developed.  HENT:     Head: Normocephalic and atraumatic.     Right Ear: Tympanic membrane and external ear normal.     Left Ear: Tympanic membrane and external ear normal.     Nose: Nose normal.     Mouth/Throat:     Mouth: Mucous membranes are moist.     Pharynx: Oropharynx is clear.  Eyes:     Conjunctiva/sclera: Conjunctivae normal.  Cardiovascular:     Rate and Rhythm: Normal rate and regular rhythm.     Heart sounds: Normal heart sounds. No murmur heard. Pulmonary:     Effort: Pulmonary effort is normal.     Breath sounds: Normal breath sounds. No wheezing, rhonchi or rales.  Musculoskeletal:     Cervical back: Neck supple.  Skin:    General: Skin is warm and dry.     Findings: No rash.  Neurological:     Mental Status: She is alert and oriented to person, place, and time.  Psychiatric:        Behavior: Behavior normal.   Previous notes and tests were reviewed. The plan was reviewed with the patient/family, and all questions/concerned were addressed.  It was my pleasure  to see Nathaniel today and participate in her care. Please feel free to contact me with any questions or concerns.  Sincerely,  Wyline Mood, DO Allergy & Immunology  [facility] office: [phone] [facility] office: [phone]

## 2022-09-02 NOTE — Assessment & Plan Note (Signed)
Past history - 2018 skin testing was positive to tree, mold, dust mite. Medicated nasal sprays will not be used due to perceived side effects. Interim history - asymptomatic and does not any meds. Didn't like Singulair.  Continue environmental control measures. Stop montelukast.  Only use if needed:  Nasacort (triamcinolone) nasal spray 1 spray per nostril twice a day as needed for nasal congestion.  May use over the counter antihistamines such as Zyrtec (cetirizine), Claritin (loratadine), Allegra (fexofenadine), or Xyzal (levocetirizine) daily as needed. Olopatadine eye drops 0.7% once a day as needed for itchy/watery eyes.

## 2022-09-07 ENCOUNTER — Encounter: Payer: Self-pay | Admitting: Nurse Practitioner

## 2022-09-18 ENCOUNTER — Ambulatory Visit: Payer: 59 | Attending: Nurse Practitioner | Admitting: Nurse Practitioner

## 2022-09-18 ENCOUNTER — Encounter: Payer: Self-pay | Admitting: Nurse Practitioner

## 2022-09-18 VITALS — BP 130/67 | HR 67 | Temp 97.9°F | Ht <= 58 in | Wt 121.0 lb

## 2022-09-18 DIAGNOSIS — D649 Anemia, unspecified: Secondary | ICD-10-CM

## 2022-09-18 DIAGNOSIS — R630 Anorexia: Secondary | ICD-10-CM | POA: Diagnosis not present

## 2022-09-18 DIAGNOSIS — R234 Changes in skin texture: Secondary | ICD-10-CM

## 2022-09-18 DIAGNOSIS — E1165 Type 2 diabetes mellitus with hyperglycemia: Secondary | ICD-10-CM | POA: Diagnosis not present

## 2022-09-18 LAB — POCT GLYCOSYLATED HEMOGLOBIN (HGB A1C): HbA1c, POC (controlled diabetic range): 6.4 % (ref 0.0–7.0)

## 2022-09-18 MED ORDER — METFORMIN HCL 500 MG PO TABS
500.0000 mg | ORAL_TABLET | Freq: Every day | ORAL | Status: DC
Start: 2022-09-18 — End: 2023-06-10

## 2022-09-18 NOTE — Progress Notes (Signed)
Assessment & Plan:  [name] was seen today for diabetes.  Diagnoses and all orders for this visit:  Type 2 diabetes mellitus with hyperglycemia, without long-term current use of insulin Well-controlled. -     POCT glycosylated hemoglobin (Hb A1C) -     Thyroid Panel With TSH -     metFORMIN (GLUCOPHAGE) 500 MG tablet; Take 1 tablet (500 mg total) by mouth daily with breakfast. FOR DIABETES Continue blood sugar control as discussed in office today, low carbohydrate diet, and regular physical exercise as tolerated, 150 minutes per week (30 min each day, 5 days per week, or 50 min 3 days per week). Keep blood sugar logs with fasting goal of 90-130 mg/dl, post prandial (after you eat) less than 180.  For Hypoglycemia: BS <60 and Hyperglycemia BS >400; contact the clinic ASAP. Annual eye exams and foot exams are recommended.   Decreased appetite -     Thyroid Panel With TSH  Anemia, unspecified type -     CBC with Differential  Skin texture changes -     Ambulatory referral to Dermatology    Patient has been counseled on age-appropriate routine health concerns for screening and prevention. These are reviewed and up-to-date. Referrals have been placed accordingly. Immunizations are up-to-date or declined.    Subjective:   Chief Complaint  Patient presents with   Diabetes   HPI Tina [name] 67 y.o. female presents to office today for follow up to DM  An onsite interpreter is accompanying her today.  She has a past medical history of Asthma, Diabetes mellitus without complication (HCC), and Nephrolithiasis.   Right knee pain 1 month ago.   Dermatology skin left leg   DM  A1c has greatly improved today and down from 7.0 to 6.4.  She states she cannot tolerate the p.m. dose of metformin as it causes insomnia so she has only been taking one 500 mg dose in the a.m.  Lab Results  Component Value Date   HGBA1C 6.4 09/18/2022    Lab Results  Component Value Date   HGBA1C 7.0 (A)  05/29/2022    Endorses decreased appetite and states she is only eating 2 meals a day.  Her weight is stable.  She denies any abdominal pain, nausea or vomiting.  States she has mostly been eating fruits and vegetables and very little meat.  Endorses right knee pain with onset 1 month ago and unrelated to any injury.  She notes skin texture changes of the left lower leg along with hyperpigmentation and chronic pruritus.  She does have significant varicose veins on the posterior left lower leg but denies any pain in this area.  We have tried several different topical steroid medications including triamcinolone, betamethasone and mometasone but no improvement of symptoms.  At this time we will refer to dermatology.  Review of Systems  Constitutional:  Negative for fever, malaise/fatigue and weight loss.  HENT: Negative.  Negative for nosebleeds.   Eyes: Negative.  Negative for blurred vision, double vision and photophobia.  Respiratory: Negative.  Negative for cough and shortness of breath.   Cardiovascular: Negative.  Negative for chest pain, palpitations and leg swelling.  Gastrointestinal: Negative.  Negative for heartburn, nausea and vomiting.  Musculoskeletal: Negative.  Negative for myalgias.  Skin:  Positive for itching and rash.  Neurological: Negative.  Negative for dizziness, focal weakness, seizures and headaches.  Psychiatric/Behavioral: Negative.  Negative for suicidal ideas.     Past Medical History:  Diagnosis Date  Asthma    Diabetes mellitus without complication (HCC)    Nephrolithiasis    "came out on it's own" (12/21/2012)    Past Surgical History:  Procedure Laterality Date   CHOLECYSTECTOMY N/A 12/21/2012   Procedure: LAPAROSCOPIC CHOLECYSTECTOMY;  Surgeon: Axel Filler, MD;  Location: [facility] OR;  Service: General;  Laterality: N/A;   KIDNEY STONE SURGERY     spouse denies this hx on 12/21/2012   LAPAROSCOPIC CHOLECYSTECTOMY  12/21/2012    Family History   Problem Relation Age of Onset   Tuberculosis Mother    Asthma Mother    Asthma Father    Tuberculosis Father     Social History Reviewed with no changes to be made today.   Outpatient Medications Prior to Visit  Medication Sig Dispense Refill   albuterol (VENTOLIN HFA) 108 (90 Base) MCG/ACT inhaler Inhale 2 puffs into the lungs every 4 (four) hours as needed for wheezing or shortness of breath (coughing fits). 18 g 1   atorvastatin (LIPITOR) 80 MG tablet Take 1 tablet (80 mg total) by mouth daily. FOR CHOLESTEROL 90 tablet 3   betamethasone dipropionate 0.05 % cream Apply topically 2 (two) times daily. Apply to right leg/rash 60 g 1   diclofenac Sodium (VOLTAREN) 1 % GEL Apply 2 g topically 4 (four) times daily. For joint/bone pain 200 g 3   ferrous gluconate (FERGON) 324 MG tablet Take 1 tablet (324 mg total) by mouth daily. FOR IRON or ANEMIA 90 tablet 1   Fluticasone-Umeclidin-Vilant (TRELEGY ELLIPTA) 100-62.5-25 MCG/ACT AEPB Inhale 1 puff into the lungs daily. Rinse mouth after each use. 60 each 5   ibuprofen (ADVIL) 600 MG tablet Take 1 tablet (600 mg total) by mouth every 8 (eight) hours as needed. 60 tablet 1   metFORMIN (GLUCOPHAGE) 500 MG tablet Take 1 tablet (500 mg total) by mouth 2 (two) times daily with a meal. FOR DIABETES 180 tablet 1   No facility-administered medications prior to visit.    Allergies  Allergen Reactions   Hctz [Hydrochlorothiazide] Shortness Of Breath   Lisinopril Shortness Of Breath   Sodium Bicarbonate Tablets [Sodium Bicarbonate] Palpitations       Objective:    BP 130/67   Pulse 67   Temp 97.9 F (36.6 C) (Oral)   Ht 4\' 9"  (1.448 m)   Wt 121 lb (54.9 kg)   SpO2 96%   BMI 26.18 kg/m  Wt Readings from Last 3 Encounters:  09/18/22 121 lb (54.9 kg)  09/02/22 120 lb 8 oz (54.7 kg)  05/29/22 119 lb 9.6 oz (54.3 kg)    Physical Exam Vitals and nursing note reviewed.  Constitutional:      Appearance: She is well-developed.  HENT:      Head: Normocephalic and atraumatic.  Cardiovascular:     Rate and Rhythm: Normal rate and regular rhythm.     Heart sounds: Normal heart sounds. No murmur heard.    No friction rub. No gallop.  Pulmonary:     Effort: Pulmonary effort is normal. No tachypnea or respiratory distress.     Breath sounds: Normal breath sounds. No decreased breath sounds, wheezing, rhonchi or rales.  Chest:     Chest wall: No tenderness.  Abdominal:     General: Bowel sounds are normal.     Palpations: Abdomen is soft.  Musculoskeletal:        General: Normal range of motion.     Cervical back: Normal range of motion.  Skin:    General: Skin  is warm and dry.     Findings: Rash present.       Neurological:     Mental Status: She is alert and oriented to person, place, and time.     Coordination: Coordination normal.  Psychiatric:        Behavior: Behavior normal. Behavior is cooperative.        Thought Content: Thought content normal.        Judgment: Judgment normal.          Patient has been counseled extensively about nutrition and exercise as well as the importance of adherence with medications and regular follow-up. The patient was given clear instructions to go to ER or return to medical center if symptoms don't improve, worsen or new problems develop. The patient verbalized understanding.   Follow-up: Return in about 3 months (around 12/19/2022) for a1c.   Claiborne Rigg, FNP-BC [facility] and [facility] [place], [place] [phone]   09/18/2022, 5:03 PM

## 2022-09-19 LAB — CBC WITH DIFFERENTIAL/PLATELET
Basophils Absolute: 0 10*3/uL (ref 0.0–0.2)
Basos: 0 %
EOS (ABSOLUTE): 0.3 10*3/uL (ref 0.0–0.4)
Eos: 4 %
Hematocrit: 42.3 % (ref 34.0–46.6)
Hemoglobin: 13.1 g/dL (ref 11.1–15.9)
Immature Grans (Abs): 0 10*3/uL (ref 0.0–0.1)
Immature Granulocytes: 0 %
Lymphocytes Absolute: 2.5 10*3/uL (ref 0.7–3.1)
Lymphs: 32 %
MCH: 24.8 pg — ABNORMAL LOW (ref 26.6–33.0)
MCHC: 31 g/dL — ABNORMAL LOW (ref 31.5–35.7)
MCV: 80 fL (ref 79–97)
Monocytes Absolute: 0.5 10*3/uL (ref 0.1–0.9)
Monocytes: 7 %
Neutrophils Absolute: 4.4 10*3/uL (ref 1.4–7.0)
Neutrophils: 57 %
Platelets: 182 10*3/uL (ref 150–450)
RBC: 5.28 x10E6/uL (ref 3.77–5.28)
RDW: 14 % (ref 11.7–15.4)
WBC: 7.7 10*3/uL (ref 3.4–10.8)

## 2022-09-19 LAB — THYROID PANEL WITH TSH
Free Thyroxine Index: 1.7 (ref 1.2–4.9)
T3 Uptake Ratio: 24 % (ref 24–39)
T4, Total: 6.9 ug/dL (ref 4.5–12.0)
TSH: 2.94 u[IU]/mL (ref 0.450–4.500)

## 2022-10-06 ENCOUNTER — Telehealth: Payer: Self-pay | Admitting: *Deleted

## 2022-10-06 NOTE — Telephone Encounter (Signed)
Patient received letter- notified:  CBC does not indicate any anemia or bleeding disorders   Normal thyroid levels

## 2022-10-12 DIAGNOSIS — L309 Dermatitis, unspecified: Secondary | ICD-10-CM | POA: Diagnosis not present

## 2022-10-12 DIAGNOSIS — L810 Postinflammatory hyperpigmentation: Secondary | ICD-10-CM | POA: Diagnosis not present

## 2022-11-23 DIAGNOSIS — I8393 Asymptomatic varicose veins of bilateral lower extremities: Secondary | ICD-10-CM | POA: Diagnosis not present

## 2022-11-23 DIAGNOSIS — I872 Venous insufficiency (chronic) (peripheral): Secondary | ICD-10-CM | POA: Diagnosis not present

## 2023-01-08 ENCOUNTER — Ambulatory Visit: Payer: 59 | Admitting: Nurse Practitioner

## 2023-01-22 ENCOUNTER — Encounter: Payer: Self-pay | Admitting: Nurse Practitioner

## 2023-01-22 ENCOUNTER — Ambulatory Visit: Payer: 59 | Attending: Nurse Practitioner | Admitting: Nurse Practitioner

## 2023-01-22 VITALS — BP 127/75 | HR 77 | Ht <= 58 in | Wt 121.6 lb

## 2023-01-22 DIAGNOSIS — Z1231 Encounter for screening mammogram for malignant neoplasm of breast: Secondary | ICD-10-CM

## 2023-01-22 DIAGNOSIS — Z1211 Encounter for screening for malignant neoplasm of colon: Secondary | ICD-10-CM

## 2023-01-22 DIAGNOSIS — Z23 Encounter for immunization: Secondary | ICD-10-CM

## 2023-01-22 DIAGNOSIS — Z7984 Long term (current) use of oral hypoglycemic drugs: Secondary | ICD-10-CM

## 2023-01-22 DIAGNOSIS — I10 Essential (primary) hypertension: Secondary | ICD-10-CM

## 2023-01-22 DIAGNOSIS — E1165 Type 2 diabetes mellitus with hyperglycemia: Secondary | ICD-10-CM

## 2023-01-22 NOTE — Progress Notes (Signed)
Assessment & Plan:  Tina [name] was seen today for medical management of chronic issues.  Diagnoses and all orders for this visit:  Type 2 diabetes mellitus with hyperglycemia, without long-term current use of insulin (HCC) -     Hemoglobin A1c Continue blood sugar control as discussed in office today, low carbohydrate diet, and regular physical exercise as tolerated, 150 minutes per week (30 min each day, 5 days per week, or 50 min 3 days per week). Keep blood sugar logs with fasting goal of 90-130 mg/dl, post prandial (after you eat) less than 180.  For Hypoglycemia: BS <60 and Hyperglycemia BS >400; contact the clinic ASAP. Annual eye exams and foot exams are recommended.   Colon cancer screening -     Cancel: Ambulatory referral to Gastroenterology -     Ambulatory referral to Gastroenterology  Breast cancer screening by mammogram -     Cancel: MS 3D SCR MAMMO BILAT BR (aka MM); Future -     MM 3D SCREENING MAMMOGRAM BILATERAL BREAST; Future  Essential hypertension -     CMP14+EGFR Continue all antihypertensives as prescribed.  Reminded to bring in blood pressure log for follow  up appointment.  RECOMMENDATIONS: DASH/Mediterranean Diets are healthier choices for HTN.    Encounter for immunization -     Flu Vaccine Trivalent High Dose (Fluad)    Patient has been counseled on age-appropriate routine health concerns for screening and prevention. These are reviewed and up-to-date. Referrals have been placed accordingly. Immunizations are up-to-date or declined.    Subjective:   Chief Complaint  Patient presents with   Medical Management of Chronic Issues    Tina [name] 67 y.o. female presents to office today for follow up to DM  She is accompanied by an onsite interpreter  She has a past medical history of Asthma, Diabetes mellitus without complication (HCC), and Nephrolithiasis   Patient has been counseled on age-appropriate routine health concerns for screening and  prevention. These are reviewed and up-to-date. Referrals have been placed accordingly. Immunizations are up-to-date or declined.     COLON CANCER SCREENING: she has been referred to GI and they have been unable to contact her to schedule. She was given the number today to schedule with their office Mammogram: UTD   She notes posterior neck pain and dry mouth today.    DM  Diabetes is well controlled. A1c at goal today. She is taking metformin 500 mg daily as prescribed.  Lab Results  Component Value Date   HGBA1C 6.4 09/18/2022  LDL not at goal. She is currently prescribed high intensity statin.  Lab Results  Component Value Date   LDLCALC 142 (H) 05/29/2022   Blood pressure is well controlled.  BP Readings from Last 3 Encounters:  02/15/23 126/64  01/22/23 127/75  09/18/22 130/67     Review of Systems  Constitutional:  Negative for fever, malaise/fatigue and weight loss.  HENT: Negative.  Negative for nosebleeds.   Eyes: Negative.  Negative for blurred vision, double vision and photophobia.  Respiratory: Negative.  Negative for cough and shortness of breath.   Cardiovascular: Negative.  Negative for chest pain, palpitations and leg swelling.  Gastrointestinal: Negative.  Negative for heartburn, nausea and vomiting.  Musculoskeletal:  Positive for neck pain. Negative for myalgias.  Neurological: Negative.  Negative for dizziness, focal weakness, seizures and headaches.  Psychiatric/Behavioral: Negative.  Negative for suicidal ideas.     Past Medical History:  Diagnosis Date   Asthma    Diabetes mellitus  without complication (HCC)    Nephrolithiasis    "came out on it's own" (12/21/2012)    Past Surgical History:  Procedure Laterality Date   CHOLECYSTECTOMY N/A 12/21/2012   Procedure: LAPAROSCOPIC CHOLECYSTECTOMY;  Surgeon: Axel Filler, MD;  Location: [facility] OR;  Service: General;  Laterality: N/A;   KIDNEY STONE SURGERY     spouse denies this hx on 12/21/2012    LAPAROSCOPIC CHOLECYSTECTOMY  12/21/2012    Family History  Problem Relation Age of Onset   Tuberculosis Mother    Asthma Mother    Asthma Father    Tuberculosis Father     Social History Reviewed with no changes to be made today.   Outpatient Medications Prior to Visit  Medication Sig Dispense Refill   albuterol (VENTOLIN HFA) 108 (90 Base) MCG/ACT inhaler Inhale 2 puffs into the lungs every 4 (four) hours as needed for wheezing or shortness of breath (coughing fits). 18 g 1   atorvastatin (LIPITOR) 80 MG tablet Take 1 tablet (80 mg total) by mouth daily. FOR CHOLESTEROL 90 tablet 3   betamethasone dipropionate 0.05 % cream Apply topically 2 (two) times daily. Apply to right leg/rash 60 g 1   ibuprofen (ADVIL) 600 MG tablet Take 1 tablet (600 mg total) by mouth every 8 (eight) hours as needed. 60 tablet 1   metFORMIN (GLUCOPHAGE) 500 MG tablet Take 1 tablet (500 mg total) by mouth daily with breakfast. FOR DIABETES     Fluticasone-Umeclidin-Vilant (TRELEGY ELLIPTA) 100-62.5-25 MCG/ACT AEPB Inhale 1 puff into the lungs daily. Rinse mouth after each use. 60 each 5   diclofenac Sodium (VOLTAREN) 1 % GEL Apply 2 g topically 4 (four) times daily. For joint/bone pain 200 g 3   ferrous gluconate (FERGON) 324 MG tablet Take 1 tablet (324 mg total) by mouth daily. FOR IRON or ANEMIA 90 tablet 1   No facility-administered medications prior to visit.    Allergies  Allergen Reactions   Hctz [Hydrochlorothiazide] Shortness Of Breath   Lisinopril Shortness Of Breath   Sodium Bicarbonate Tablets [Sodium Bicarbonate] Palpitations       Objective:    BP 127/75 (BP Location: Left Arm, Patient Position: Sitting, Cuff Size: Normal)   Pulse 77   Ht 4\' 9"  (1.448 m)   Wt 121 lb 9.6 oz (55.2 kg)   SpO2 98%   BMI 26.31 kg/m  Wt Readings from Last 3 Encounters:  02/15/23 119 lb 11.2 oz (54.3 kg)  01/22/23 121 lb 9.6 oz (55.2 kg)  09/18/22 121 lb (54.9 kg)    Physical Exam Vitals and nursing  note reviewed.  Constitutional:      Appearance: She is well-developed.  HENT:     Head: Normocephalic and atraumatic.  Cardiovascular:     Rate and Rhythm: Normal rate and regular rhythm.     Heart sounds: Normal heart sounds. No murmur heard.    No friction rub. No gallop.  Pulmonary:     Effort: Pulmonary effort is normal. No tachypnea or respiratory distress.     Breath sounds: Normal breath sounds. No decreased breath sounds, wheezing, rhonchi or rales.  Chest:     Chest wall: No tenderness.  Abdominal:     General: Bowel sounds are normal.     Palpations: Abdomen is soft.  Musculoskeletal:        General: Normal range of motion.     Cervical back: Normal range of motion.  Skin:    General: Skin is warm and dry.  Neurological:  Mental Status: She is alert and oriented to person, place, and time.     Coordination: Coordination normal.  Psychiatric:        Behavior: Behavior normal. Behavior is cooperative.        Thought Content: Thought content normal.        Judgment: Judgment normal.          Patient has been counseled extensively about nutrition and exercise as well as the importance of adherence with medications and regular follow-up. The patient was given clear instructions to go to ER or return to medical center if symptoms don't improve, worsen or new problems develop. The patient verbalized understanding.   Follow-up: Return in about 6 months (around 07/22/2023) for a1c.   Claiborne Rigg, FNP-BC [facility] and [facility] [place], [place] [phone]   02/21/2023, 9:12 PM

## 2023-01-22 NOTE — Patient Instructions (Signed)
[facility] [address] [place], [place] [address] ?PH# [phone] ?

## 2023-01-23 LAB — CMP14+EGFR
ALT: 27 [IU]/L (ref 0–32)
AST: 27 [IU]/L (ref 0–40)
Albumin: 4.5 g/dL (ref 3.9–4.9)
Alkaline Phosphatase: 83 [IU]/L (ref 44–121)
BUN/Creatinine Ratio: 18 (ref 12–28)
BUN: 14 mg/dL (ref 8–27)
Bilirubin Total: 0.4 mg/dL (ref 0.0–1.2)
CO2: 23 mmol/L (ref 20–29)
Calcium: 9.5 mg/dL (ref 8.7–10.3)
Chloride: 102 mmol/L (ref 96–106)
Creatinine, Ser: 0.78 mg/dL (ref 0.57–1.00)
Globulin, Total: 2.8 g/dL (ref 1.5–4.5)
Glucose: 184 mg/dL — ABNORMAL HIGH (ref 70–99)
Potassium: 4 mmol/L (ref 3.5–5.2)
Sodium: 141 mmol/L (ref 134–144)
Total Protein: 7.3 g/dL (ref 6.0–8.5)
eGFR: 83 mL/min/{1.73_m2} (ref >59)

## 2023-01-23 LAB — HEMOGLOBIN A1C
Est. average glucose Bld gHb Est-mCnc: 140 mg/dL
Hgb A1c MFr Bld: 6.5 % — ABNORMAL HIGH (ref 4.8–5.6)

## 2023-01-26 ENCOUNTER — Ambulatory Visit
Admission: RE | Admit: 2023-01-26 | Discharge: 2023-01-26 | Disposition: A | Discharge status: Home / Self Care | Payer: 59 | Source: Ambulatory Visit | Attending: Nurse Practitioner | Admitting: Nurse Practitioner

## 2023-01-26 DIAGNOSIS — Z9189 Other specified personal risk factors, not elsewhere classified: Secondary | ICD-10-CM

## 2023-01-26 DIAGNOSIS — M8588 Other specified disorders of bone density and structure, other site: Secondary | ICD-10-CM | POA: Diagnosis not present

## 2023-02-14 NOTE — Progress Notes (Unsigned)
Follow Up Note  RE: Tina [name] MRN: [id number] DOB: 01-Jun-1955 Date of Office Visit: 02/15/2023  Referring provider: Claiborne Rigg, NP Primary care provider: Claiborne Rigg, NP  Chief Complaint: No chief complaint on file.  History of Present Illness: I had the pleasure of seeing Tina [name] for a follow up visit at the [facility] on 02/14/2023. She is a 67 y.o. female, who is being followed for asthma, allergic rhinoconjunctivitis. Her previous allergy office visit was on 09/02/2022, with Dr. Selena Batten. Today is a regular follow up visit.  Discussed the use of AI scribe software for clinical note transcription with the patient, who gave verbal consent to proceed.  History of Present Illness            Moderate persistent asthma without complication Prefers Trelegy over Advair. Albuterol makes her throat scratchy. Today's spirometry was unremarkable given effort.  Daily controller medication(s):  Start Trelegy 1 puff once a day and rinse mouth after each use. Stop other inhaler.  May use albuterol rescue inhaler 2 puffs or nebulizer every 4 to 6 hours as needed for shortness of breath, chest tightness, coughing, and wheezing. Monitor frequency of use - if you need to use it more than twice per week on a consistent basis let us know.  Get spirometry at next visit.   Seasonal and perennial allergic rhinoconjunctivitis Past history - 2018 skin testing was positive to tree, mold, dust mite. Medicated nasal sprays will not be used due to perceived side effects. Interim history - asymptomatic and does not any meds. Didn't like Singulair.  Continue environmental control measures. Stop montelukast.  Only use if needed:  Nasacort (triamcinolone) nasal spray 1 spray per nostril twice a day as needed for nasal congestion.  May use over the counter antihistamines such as Zyrtec (cetirizine), Claritin (loratadine), Allegra (fexofenadine), or Xyzal (levocetirizine)  daily as needed. Olopatadine eye drops 0.7% once a day as needed for itchy/watery eyes.    Assessment and Plan: Tina [name] is a 67 y.o. female with: Moderate persistent asthma without complication Prefers Trelegy over Advair. Albuterol makes her throat scratchy. Today's spirometry was unremarkable given effort.  Daily controller medication(s):  Start Trelegy 1 puff once a day and rinse mouth after each use. Stop other inhaler.  May use albuterol rescue inhaler 2 puffs or nebulizer every 4 to 6 hours as needed for shortness of breath, chest tightness, coughing, and wheezing. Monitor frequency of use - if you need to use it more than twice per week on a consistent basis let us know.  Get spirometry at next visit.   Seasonal and perennial allergic rhinoconjunctivitis Past history - 2018 skin testing was positive to tree, mold, dust mite. Medicated nasal sprays will not be used due to perceived side effects. Interim history - asymptomatic and does not any meds. Didn't like Singulair.  Assessment and Plan              No follow-ups on file.  No orders of the defined types were placed in this encounter.  Lab Orders  No laboratory test(s) ordered today    Diagnostics: Spirometry:  Tracings reviewed. Her effort: {Blank single:19197::"Good reproducible efforts.","It was hard to get consistent efforts and there is a question as to whether this reflects a maximal maneuver.","Poor effort, data can not be interpreted."} FVC: ***L FEV1: ***L, ***% predicted FEV1/FVC ratio: ***% Interpretation: {Blank single:19197::"Spirometry consistent with mild obstructive disease","Spirometry consistent with moderate obstructive disease","Spirometry consistent with severe  obstructive disease","Spirometry consistent with possible restrictive disease","Spirometry consistent with mixed obstructive and restrictive disease","Spirometry uninterpretable due to technique","Spirometry consistent with normal  pattern","No overt abnormalities noted given today's efforts"}.  Please see scanned spirometry results for details.  Skin Testing: {Blank single:19197::"Select foods","Environmental allergy panel","Environmental allergy panel and select foods","Food allergy panel","None","Deferred due to recent antihistamines use"}. *** Results discussed with patient/family.   Medication List:  Current Outpatient Medications  Medication Sig Dispense Refill   albuterol (VENTOLIN HFA) 108 (90 Base) MCG/ACT inhaler Inhale 2 puffs into the lungs every 4 (four) hours as needed for wheezing or shortness of breath (coughing fits). 18 g 1   atorvastatin (LIPITOR) 80 MG tablet Take 1 tablet (80 mg total) by mouth daily. FOR CHOLESTEROL 90 tablet 3   betamethasone dipropionate 0.05 % cream Apply topically 2 (two) times daily. Apply to right leg/rash 60 g 1   diclofenac Sodium (VOLTAREN) 1 % GEL Apply 2 g topically 4 (four) times daily. For joint/bone pain (Patient not taking: Reported on 01/22/2023) 200 g 3   ferrous gluconate (FERGON) 324 MG tablet Take 1 tablet (324 mg total) by mouth daily. FOR IRON or ANEMIA (Patient not taking: Reported on 01/22/2023) 90 tablet 1   Fluticasone-Umeclidin-Vilant (TRELEGY ELLIPTA) 100-62.5-25 MCG/ACT AEPB Inhale 1 puff into the lungs daily. Rinse mouth after each use. 60 each 5   ibuprofen (ADVIL) 600 MG tablet Take 1 tablet (600 mg total) by mouth every 8 (eight) hours as needed. 60 tablet 1   metFORMIN (GLUCOPHAGE) 500 MG tablet Take 1 tablet (500 mg total) by mouth daily with breakfast. FOR DIABETES     No current facility-administered medications for this visit.   Allergies: Allergies  Allergen Reactions   Hctz [Hydrochlorothiazide] Shortness Of Breath   Lisinopril Shortness Of Breath   Sodium Bicarbonate Tablets [Sodium Bicarbonate] Palpitations   I reviewed her past medical history, social history, family history, and environmental history and no significant changes have  been reported from her previous visit.  Review of Systems  Constitutional:  Negative for appetite change, chills, fever and unexpected weight change.  HENT:  Negative for congestion and rhinorrhea.   Eyes:  Negative for itching.  Respiratory:  Negative for cough, chest tightness, shortness of breath and wheezing.   Gastrointestinal:  Negative for abdominal pain.  Skin:  Negative for rash.  Allergic/Immunologic: Positive for environmental allergies.  Neurological:  Negative for headaches.   Objective: There were no vitals taken for this visit. There is no height or weight on file to calculate BMI. Physical Exam Vitals and nursing note reviewed.  Constitutional:      Appearance: Normal appearance. She is well-developed.  HENT:     Head: Normocephalic and atraumatic.     Right Ear: Tympanic membrane and external ear normal.     Left Ear: Tympanic membrane and external ear normal.     Nose: Nose normal.     Mouth/Throat:     Mouth: Mucous membranes are moist.     Pharynx: Oropharynx is clear.  Eyes:     Conjunctiva/sclera: Conjunctivae normal.  Cardiovascular:     Rate and Rhythm: Normal rate and regular rhythm.     Heart sounds: Normal heart sounds. No murmur heard. Pulmonary:     Effort: Pulmonary effort is normal.     Breath sounds: Normal breath sounds. No wheezing, rhonchi or rales.  Musculoskeletal:     Cervical back: Neck supple.  Skin:    General: Skin is warm and dry.  Findings: No rash.  Neurological:     Mental Status: She is alert and oriented to person, place, and time.  Psychiatric:        Behavior: Behavior normal.  Previous notes and tests were reviewed. The plan was reviewed with the patient/family, and all questions/concerned were addressed.  It was my pleasure to see Anelisse today and participate in her care. Please feel free to contact me with any questions or concerns.  Sincerely,  Wyline Mood, DO Allergy & Immunology  [facility] office: [phone] [facility] office: [phone]

## 2023-02-15 ENCOUNTER — Encounter: Payer: Self-pay | Admitting: Allergy

## 2023-02-15 ENCOUNTER — Ambulatory Visit (INDEPENDENT_AMBULATORY_CARE_PROVIDER_SITE_OTHER): Payer: 59 | Admitting: Allergy

## 2023-02-15 ENCOUNTER — Other Ambulatory Visit: Payer: Self-pay

## 2023-02-15 VITALS — BP 126/64 | HR 68 | Temp 98.4°F | Ht <= 58 in | Wt 119.7 lb

## 2023-02-15 DIAGNOSIS — J4540 Moderate persistent asthma, uncomplicated: Secondary | ICD-10-CM

## 2023-02-15 DIAGNOSIS — H1013 Acute atopic conjunctivitis, bilateral: Secondary | ICD-10-CM | POA: Diagnosis not present

## 2023-02-15 DIAGNOSIS — H1010 Acute atopic conjunctivitis, unspecified eye: Secondary | ICD-10-CM

## 2023-02-15 DIAGNOSIS — J3089 Other allergic rhinitis: Secondary | ICD-10-CM | POA: Diagnosis not present

## 2023-02-15 DIAGNOSIS — J302 Other seasonal allergic rhinitis: Secondary | ICD-10-CM | POA: Diagnosis not present

## 2023-02-15 MED ORDER — TRELEGY ELLIPTA 200-62.5-25 MCG/ACT IN AEPB: 1.0000 | INHALATION_SPRAY | Freq: Every day | RESPIRATORY_TRACT | 5 refills | Status: DC

## 2023-02-15 NOTE — Patient Instructions (Addendum)
BRING ALL YOUR INHALERS TO YOUR NEXT VISIT.  Asthma: Daily controller medication(s):  Increase Trelegy to 1 puff once a day and rinse mouth after each use. STOP dose May use albuterol rescue inhaler 2 puffs every 4 to 6 hours as needed for shortness of breath, chest tightness, coughing, and wheezing. Monitor frequency of use - if you need to use it more than twice per week on a consistent basis let us know.  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 Continue environmental control measures. Continue Singulair (montelukast) 10mg  daily at night. Only use if needed:  Nasacort (triamcinolone) nasal spray 1 spray per nostril twice a day as needed for nasal congestion.  May use over the counter antihistamines such as Zyrtec (cetirizine), Claritin (loratadine), Allegra (fexofenadine), or Xyzal (levocetirizine) daily as needed. Olopatadine eye drops 0.7% once a day as needed for itchy/watery eyes.  Follow up in 3 months or sooner if needed.

## 2023-02-16 ENCOUNTER — Ambulatory Visit
Admission: RE | Admit: 2023-02-16 | Discharge: 2023-02-16 | Disposition: A | Discharge status: Home / Self Care | Payer: 59 | Source: Ambulatory Visit | Attending: Nurse Practitioner | Admitting: Nurse Practitioner

## 2023-02-16 DIAGNOSIS — Z1231 Encounter for screening mammogram for malignant neoplasm of breast: Secondary | ICD-10-CM

## 2023-02-21 ENCOUNTER — Encounter: Payer: Self-pay | Admitting: Nurse Practitioner

## 2023-02-22 DIAGNOSIS — I8393 Asymptomatic varicose veins of bilateral lower extremities: Secondary | ICD-10-CM | POA: Diagnosis not present

## 2023-02-22 DIAGNOSIS — I872 Venous insufficiency (chronic) (peripheral): Secondary | ICD-10-CM | POA: Diagnosis not present

## 2023-05-16 NOTE — Progress Notes (Unsigned)
 Follow Up Note  RE: Tina [name] MRN: [id number] DOB: 1955/06/09 Date of Office Visit: 05/17/2023  Referring provider: Claiborne Rigg, NP Primary care provider: Claiborne Rigg, NP  Chief Complaint: No chief complaint on file.  History of Present Illness: I had the pleasure of seeing Tina [name] for a follow up visit at the [facility] on 05/16/2023. She is a 68 y.o. female, who is being followed for asthma, allergic rhinoconjunctivitis. Her previous allergy office visit was on 02/15/2023 with Dr. Selena Batten. Today is a regular follow up visit.  Discussed the use of AI scribe software for clinical note transcription with the patient, who gave verbal consent to proceed.  History of Present Illness            ***  Assessment and Plan: Tina [name] is a 68 y.o. female with: Not well controlled moderate persistent asthma Persistent wheezing, predominantly nocturnal. Current Trelegy ( dose) provides some relief but symptoms persist. No acute exacerbation requiring rescue inhaler use reported. Today's spirometry showed some restriction. Daily controller medication(s):  Increase Trelegy to 1 puff once a day and rinse mouth after each use. STOP dose May use albuterol rescue inhaler 2 puffs every 4 to 6 hours as needed for shortness of breath, chest tightness, coughing, and wheezing. Monitor frequency of use - if you need to use it more than twice per week on a consistent basis let us know.  Get spirometry at next visit. If no improvement consider biologics next.    Seasonal and perennial allergic rhinoconjunctivitis Past history - 2018 skin testing was positive to tree, mold, dust mite. Medicated nasal sprays will not be used due to perceived side effects. Interim history - Taking nightly montelukast with reported improvement in symptoms. Continue environmental control measures. Continue Singulair (montelukast) 10mg  daily at night. Only use if needed:   Nasacort (triamcinolone) nasal spray 1 spray per nostril twice a day as needed for nasal congestion.  May use over the counter antihistamines such as Zyrtec (cetirizine), Claritin (loratadine), Allegra (fexofenadine), or Xyzal (levocetirizine) daily as needed. Olopatadine eye drops 0.7% once a day as needed for itchy/watery eyes. Assessment and Plan              No follow-ups on file.  No orders of the defined types were placed in this encounter.  Lab Orders  No laboratory test(s) ordered today    Diagnostics: Spirometry:  Tracings reviewed. Her effort: {Blank single:19197::"Good reproducible efforts.","It was hard to get consistent efforts and there is a question as to whether this reflects a maximal maneuver.","Poor effort, data can not be interpreted."} FVC: ***L FEV1: ***L, ***% predicted FEV1/FVC ratio: ***% Interpretation: {Blank single:19197::"Spirometry consistent with mild obstructive disease","Spirometry consistent with moderate obstructive disease","Spirometry consistent with severe obstructive disease","Spirometry consistent with possible restrictive disease","Spirometry consistent with mixed obstructive and restrictive disease","Spirometry uninterpretable due to technique","Spirometry consistent with normal pattern","No overt abnormalities noted given today's efforts"}.  Please see scanned spirometry results for details.  Skin Testing: {Blank single:19197::"Select foods","Environmental allergy panel","Environmental allergy panel and select foods","Food allergy panel","None","Deferred due to recent antihistamines use"}. *** Results discussed with patient/family.   Medication List:  Current Outpatient Medications  Medication Sig Dispense Refill  . albuterol (VENTOLIN HFA) 108 (90 Base) MCG/ACT inhaler Inhale 2 puffs into the lungs every 4 (four) hours as needed for wheezing or shortness of breath (coughing fits). 18 g 1  . atorvastatin (LIPITOR) 80 MG tablet Take 1  tablet (80 mg total) by mouth daily.  FOR CHOLESTEROL 90 tablet 3  . betamethasone dipropionate 0.05 % cream Apply topically 2 (two) times daily. Apply to right leg/rash 60 g 1  . diclofenac Sodium (VOLTAREN) 1 % GEL Apply 2 g topically 4 (four) times daily. For joint/bone pain 200 g 3  . ferrous gluconate (FERGON) 324 MG tablet Take 1 tablet (324 mg total) by mouth daily. FOR IRON or ANEMIA 90 tablet 1  . Fluticasone-Umeclidin-Vilant (TRELEGY ELLIPTA) 200-62.5-25 MCG/ACT AEPB Inhale 1 puff into the lungs daily. Rinse mouth after each use. 60 each 5  . ibuprofen (ADVIL) 600 MG tablet Take 1 tablet (600 mg total) by mouth every 8 (eight) hours as needed. 60 tablet 1  . metFORMIN (GLUCOPHAGE) 500 MG tablet Take 1 tablet (500 mg total) by mouth daily with breakfast. FOR DIABETES    . montelukast (SINGULAIR) 10 MG tablet Take 10 mg by mouth at bedtime.     No current facility-administered medications for this visit.   Allergies: Allergies  Allergen Reactions  . Hctz [Hydrochlorothiazide] Shortness Of Breath  . Lisinopril Shortness Of Breath  . Sodium Bicarbonate Tablets [Sodium Bicarbonate] Palpitations   I reviewed her past medical history, social history, family history, and environmental history and no significant changes have been reported from her previous visit.  Review of Systems  Constitutional:  Negative for appetite change, chills, fever and unexpected weight change.  HENT:  Negative for congestion and rhinorrhea.   Eyes:  Negative for itching.  Respiratory:  Positive for wheezing. Negative for cough, chest tightness and shortness of breath.   Gastrointestinal:  Negative for abdominal pain.  Skin:  Negative for rash.  Allergic/Immunologic: Positive for environmental allergies.  Neurological:  Negative for headaches.   Objective: There were no vitals taken for this visit. There is no height or weight on file to calculate BMI. Physical Exam Vitals and nursing note reviewed.   Constitutional:      Appearance: Normal appearance. She is well-developed.  HENT:     Head: Normocephalic and atraumatic.     Right Ear: Tympanic membrane and external ear normal.     Left Ear: Tympanic membrane and external ear normal.     Nose: Nose normal.     Mouth/Throat:     Mouth: Mucous membranes are moist.     Pharynx: Oropharynx is clear.  Eyes:     Conjunctiva/sclera: Conjunctivae normal.  Cardiovascular:     Rate and Rhythm: Normal rate and regular rhythm.     Heart sounds: Normal heart sounds. No murmur heard. Pulmonary:     Effort: Pulmonary effort is normal.     Breath sounds: Normal breath sounds. No wheezing, rhonchi or rales.  Musculoskeletal:     Cervical back: Neck supple.  Skin:    General: Skin is warm and dry.     Findings: No rash.  Neurological:     Mental Status: She is alert and oriented to person, place, and time.  Psychiatric:        Behavior: Behavior normal.  Previous notes and tests were reviewed. The plan was reviewed with the patient/family, and all questions/concerned were addressed.  It was my pleasure to see Tina [name] today and participate in her care. Please feel free to contact me with any questions or concerns.  Sincerely,  Wyline Mood, DO Allergy & Immunology  [facility] office: [phone] [facility] office: [phone]

## 2023-05-17 ENCOUNTER — Other Ambulatory Visit: Payer: Self-pay

## 2023-05-17 ENCOUNTER — Other Ambulatory Visit: Payer: Self-pay | Admitting: Allergy

## 2023-05-17 ENCOUNTER — Ambulatory Visit (INDEPENDENT_AMBULATORY_CARE_PROVIDER_SITE_OTHER): Payer: 59 | Admitting: Allergy

## 2023-05-17 ENCOUNTER — Encounter: Payer: Self-pay | Admitting: Allergy

## 2023-05-17 VITALS — BP 120/68 | HR 93 | Temp 100.2°F | Resp 16

## 2023-05-17 DIAGNOSIS — H1013 Acute atopic conjunctivitis, bilateral: Secondary | ICD-10-CM | POA: Diagnosis not present

## 2023-05-17 DIAGNOSIS — J302 Other seasonal allergic rhinitis: Secondary | ICD-10-CM

## 2023-05-17 DIAGNOSIS — J3089 Other allergic rhinitis: Secondary | ICD-10-CM

## 2023-05-17 DIAGNOSIS — J4540 Moderate persistent asthma, uncomplicated: Secondary | ICD-10-CM | POA: Diagnosis not present

## 2023-05-17 MED ORDER — MONTELUKAST SODIUM 10 MG PO TABS: 10.0000 mg | ORAL_TABLET | Freq: Every day | ORAL | 5 refills | Status: DC

## 2023-05-17 MED ORDER — TRELEGY ELLIPTA 200-62.5-25 MCG/ACT IN AEPB: 1.0000 | INHALATION_SPRAY | Freq: Every day | RESPIRATORY_TRACT | 5 refills | Status: DC

## 2023-05-17 NOTE — Patient Instructions (Addendum)
 BRING ALL YOUR INHALERS TO YOUR NEXT VISIT.  Asthma: Daily controller medication(s):  Continue Trelegy 1 puff once a day and rinse mouth after each use. May use albuterol rescue inhaler 2 puffs every 4 to 6 hours as needed for shortness of breath, chest tightness, coughing, and wheezing. Monitor frequency of use - if you need to use it more than twice per week on a consistent basis let us know.  Asthma control goals:  Full participation in all desired activities (may need albuterol before activity) Albuterol use two times or less a week on average (not counting use with activity) Cough interfering with sleep two times or less a month Oral steroids no more than once a year No hospitalizations  Allergic rhino conjunctivitis: 2018 skin testing positive to tree, mold, dust mite. Continue environmental control measures. Continue Singulair (montelukast) 10mg  daily at night.  Follow up with PCP regarding your blood pressure concerns.  Make sure you take your blood pressure machine with you.  Follow up in 4 months or sooner if needed.

## 2023-06-10 ENCOUNTER — Ambulatory Visit: Attending: Physician Assistant | Admitting: Physician Assistant

## 2023-06-10 ENCOUNTER — Other Ambulatory Visit: Payer: Self-pay

## 2023-06-10 ENCOUNTER — Encounter: Payer: Self-pay | Admitting: Physician Assistant

## 2023-06-10 VITALS — BP 132/78 | HR 72 | Temp 98.4°F | Ht <= 58 in | Wt 118.0 lb

## 2023-06-10 DIAGNOSIS — R058 Other specified cough: Secondary | ICD-10-CM

## 2023-06-10 DIAGNOSIS — R030 Elevated blood-pressure reading, without diagnosis of hypertension: Secondary | ICD-10-CM | POA: Diagnosis not present

## 2023-06-10 DIAGNOSIS — E785 Hyperlipidemia, unspecified: Secondary | ICD-10-CM | POA: Diagnosis not present

## 2023-06-10 DIAGNOSIS — Z603 Acculturation difficulty: Secondary | ICD-10-CM

## 2023-06-10 DIAGNOSIS — Z758 Other problems related to medical facilities and other health care: Secondary | ICD-10-CM

## 2023-06-10 DIAGNOSIS — E1165 Type 2 diabetes mellitus with hyperglycemia: Secondary | ICD-10-CM

## 2023-06-10 DIAGNOSIS — J4540 Moderate persistent asthma, uncomplicated: Secondary | ICD-10-CM | POA: Diagnosis not present

## 2023-06-10 DIAGNOSIS — Z7984 Long term (current) use of oral hypoglycemic drugs: Secondary | ICD-10-CM | POA: Diagnosis not present

## 2023-06-10 LAB — GLUCOSE, POCT (MANUAL RESULT ENTRY): POC Glucose: 172 mg/dL — AB (ref 70–99)

## 2023-06-10 LAB — POCT GLYCOSYLATED HEMOGLOBIN (HGB A1C): HbA1c, POC (controlled diabetic range): 7.1 % — AB (ref 0.0–7.0)

## 2023-06-10 MED ORDER — TRELEGY ELLIPTA 200-62.5-25 MCG/ACT IN AEPB: 1.0000 | INHALATION_SPRAY | Freq: Every day | RESPIRATORY_TRACT | 5 refills | Status: DC

## 2023-06-10 MED ORDER — MONTELUKAST SODIUM 10 MG PO TABS: 10.0000 mg | ORAL_TABLET | Freq: Every day | ORAL | 5 refills | Status: DC

## 2023-06-10 MED ORDER — ATORVASTATIN CALCIUM 80 MG PO TABS: 80.0000 mg | ORAL_TABLET | Freq: Every day | ORAL | 3 refills | Status: DC

## 2023-06-10 MED ORDER — METFORMIN HCL 500 MG PO TABS: 500.0000 mg | ORAL_TABLET | Freq: Two times a day (BID) | ORAL | 3 refills | Status: DC

## 2023-06-10 MED ORDER — BLOOD PRESSURE CUFF MISC
1.0000 | Freq: Every day | 0 refills | Status: AC
  Filled 2023-06-10: qty 1, fill #0

## 2023-06-10 MED ORDER — BENZONATATE 100 MG PO CAPS: 200.0000 mg | ORAL_CAPSULE | Freq: Three times a day (TID) | ORAL | 0 refills | Status: DC | PRN

## 2023-06-10 MED ORDER — ALBUTEROL SULFATE HFA 108 (90 BASE) MCG/ACT IN AERS: 1.0000 | INHALATION_SPRAY | RESPIRATORY_TRACT | 0 refills | Status: DC | PRN

## 2023-06-10 NOTE — Patient Instructions (Addendum)
 Drink 64 ounces water daily.    Sit still and quiet for 5 mins before checking blood pressure and take deep breaths.  Then check pressures daily and record.  Bring these numbers to your next appt.

## 2023-06-10 NOTE — Progress Notes (Signed)
 Patient ID: Tina [name], female   DOB: 05-18-1955, 68 y.o.   MRN: [id number]    Tina [name], is a 68 y.o. female  [id number]  [id number]  DOB - [birth year]  Chief Complaint  Patient presents with   Hypertension    HTN & Dm f/u. Concern about on-going dry cough - not sleeping        Subjective:   Tina [name] is a 68 y.o. female here today for regular follow up.  Interpreter was not provided but we were able to get [facility] interpreter [place] on the line on speaker phone.  Patient is having a cough and has been out of one of her inhlaers.  Seems to have started since trees started blooming a few weeks ago.    Taking metformin once daily.    Husband wants a BP med for her to take prn.  They did bring their cuff with them to the office.  I checked it against our BP machine and me checking her BP manually and his cuff is reading high.     No problems updated.  ALLERGIES: Allergies  Allergen Reactions   Hctz [Hydrochlorothiazide] Shortness Of Breath   Lisinopril Shortness Of Breath   Sodium Bicarbonate Tablets [Sodium Bicarbonate] Palpitations    PAST MEDICAL HISTORY: Past Medical History:  Diagnosis Date   Asthma    Diabetes mellitus without complication (HCC)    Nephrolithiasis    "came out on it's own" (12/21/2012)    MEDICATIONS AT HOME: Prior to Admission medications   Medication Sig Start Date End Date Taking? Authorizing Provider  benzonatate (TESSALON) 100 MG capsule Take 2 capsules (200 mg total) by mouth 3 (three) times daily as needed. 06/10/23  Yes Georgian Co M, PA-C  betamethasone dipropionate 0.05 % cream Apply topically 2 (two) times daily. Apply to right leg/rash 05/29/22  Yes Claiborne Rigg, NP  Blood Pressure Monitoring (BLOOD PRESSURE CUFF) MISC 1 each by Does not apply route daily. 06/10/23  Yes Anders Simmonds, PA-C  diclofenac Sodium (VOLTAREN) 1 % GEL Apply 2 g topically 4 (four) times daily. For joint/bone pain 05/29/22  Yes Claiborne Rigg, NP   ferrous gluconate (FERGON) 324 MG tablet Take 1 tablet (324 mg total) by mouth daily. FOR IRON or ANEMIA 05/29/22  Yes Claiborne Rigg, NP  ibuprofen (ADVIL) 600 MG tablet Take 1 tablet (600 mg total) by mouth every 8 (eight) hours as needed. 03/21/21  Yes Claiborne Rigg, NP  albuterol (VENTOLIN HFA) 108 (90 Base) MCG/ACT inhaler Inhale 1-2 puffs into the lungs every 4 (four) hours as needed for wheezing or shortness of breath. 06/10/23   Anders Simmonds, PA-C  atorvastatin (LIPITOR) 80 MG tablet Take 1 tablet (80 mg total) by mouth daily. FOR CHOLESTEROL 06/10/23   Anders Simmonds, PA-C  Fluticasone-Umeclidin-Vilant (TRELEGY ELLIPTA) 200-62.5-25 MCG/ACT AEPB Inhale 1 puff into the lungs daily. Rinse mouth after each use. 06/10/23   Anders Simmonds, PA-C  metFORMIN (GLUCOPHAGE) 500 MG tablet Take 1 tablet (500 mg total) by mouth 2 (two) times daily with a meal. FOR DIABETES 06/10/23   Anders Simmonds, PA-C  montelukast (SINGULAIR) 10 MG tablet Take 1 tablet (10 mg total) by mouth at bedtime. 06/10/23   Anders Simmonds, PA-C    ROS: Neg HEENT Neg cardiac Neg GI Neg GU Neg MS Neg psych Neg neuro  Objective:   Vitals:   06/10/23 1422 06/10/23 1516  BP: 125/75 132/78  Pulse: 72   Temp:  98.4 F (36.9 C)   TempSrc: Oral   SpO2: 98%   Weight: 118 lb (53.5 kg)   Height: 4\' 9"  (1.448 m)    Exam General appearance : Awake, alert, not in any distress. Speech Clear. Not toxic looking HEENT: Atraumatic and Normocephalic, mucus membranes dry, throat with PND Neck: Supple, no JVD. No cervical lymphadenopathy.  Chest: Good air entry bilaterally, CTAB.  No rales/rhonchi.  Minimal wheezing CVS: S1 S2 regular, no murmurs.  Extremities: B/L Lower Ext shows no edema, both legs are warm to touch Neurology: Awake alert, and oriented X 3, CN II-XII intact, Non focal Skin: No Rash  Data Review Lab Results  Component Value Date   HGBA1C 7.1 (A) 06/10/2023   HGBA1C 6.5 (H) 01/22/2023   HGBA1C  6.4 09/18/2022    Assessment & Plan   1. Type 2 diabetes mellitus with hyperglycemia, unspecified whether long term insulin use (HCC) (Primary) Increase metformin to bid.  Increase water intake - Glucose (CBG) - HgB A1c - metFORMIN (GLUCOPHAGE) 500 MG tablet; Take 1 tablet (500 mg total) by mouth 2 (two) times daily with a meal. FOR DIABETES  Dispense: 60 tablet; Refill: 3  2. Long term current use of oral hypoglycemic drug  3. Language barrier [place]-interpreter with [facility]  4. Dyslipidemia, goal LDL below 100 - atorvastatin (LIPITOR) 80 MG tablet; Take 1 tablet (80 mg total) by mouth daily. FOR CHOLESTEROL  Dispense: 90 tablet; Refill: 3  5. Other cough - benzonatate (TESSALON) 100 MG capsule; Take 2 capsules (200 mg total) by mouth 3 (three) times daily as needed.  Dispense: 40 capsule; Refill: 0  6. Moderate persistent asthma without complication - albuterol (VENTOLIN HFA) 108 (90 Base) MCG/ACT inhaler; Inhale 1-2 puffs into the lungs every 4 (four) hours as needed for wheezing or shortness of breath.  Dispense: 18 g; Refill: 0 - montelukast (SINGULAIR) 10 MG tablet; Take 1 tablet (10 mg total) by mouth at bedtime.  Dispense: 30 tablet; Refill: 5 - Fluticasone-Umeclidin-Vilant (TRELEGY ELLIPTA) 200-62.5-25 MCG/ACT AEPB; Inhale 1 puff into the lungs daily. Rinse mouth after each use.  Dispense: 60 each; Refill: 5  7. Elevated blood pressure reading I checked it against our BP machine and me checking her BP manually and his cuff is reading high.  The reading on his cuff in office was 147/96.  I recommend new cuff(to which he is very upset) Drink 64 ounces water daily.   Sit still and quiet for 5 mins before checking blood pressure and take deep breaths.  Then check pressures daily and record.  Bring these numbers to your next appt.   - Blood Pressure Monitoring (BLOOD PRESSURE CUFF) MISC; 1 each by Does not apply route daily.  Dispense: 1 each; Refill: 0 I reviewed BP for the last 2  yrs in Epic and none high.  I refused giving a BP med "to have on hand when she needs it."  He(the husband) was not happy with me.  I explained we will be glad to prescribe it if her averages are high but can not based on current situation described above.    Return in about 4 weeks (around 07/08/2023) for Luke-BP review.  The patient was given clear instructions to go to ER or return to medical center if symptoms don't improve, worsen or new problems develop. The patient verbalized understanding. The patient was told to call to get lab results if they haven't heard anything in the next week.      Marylene Land  Sharon Seller, PA-C [facility] [place], [place] [phone]   06/10/2023, 4:59 PM

## 2023-07-16 ENCOUNTER — Ambulatory Visit: Attending: Nurse Practitioner | Admitting: Pharmacist

## 2023-07-16 ENCOUNTER — Encounter: Payer: Self-pay | Admitting: Pharmacist

## 2023-07-16 VITALS — BP 119/68 | HR 69

## 2023-07-16 DIAGNOSIS — R030 Elevated blood-pressure reading, without diagnosis of hypertension: Secondary | ICD-10-CM | POA: Diagnosis not present

## 2023-07-16 DIAGNOSIS — G8929 Other chronic pain: Secondary | ICD-10-CM

## 2023-07-16 DIAGNOSIS — R002 Palpitations: Secondary | ICD-10-CM | POA: Diagnosis not present

## 2023-07-16 DIAGNOSIS — R519 Headache, unspecified: Secondary | ICD-10-CM | POA: Diagnosis not present

## 2023-07-16 NOTE — Progress Notes (Signed)
 S:     No chief complaint on file.  68 y.o. female who presents for hypertension evaluation, education, and management.   Patient was referred and last seen by Dulce Gibbs, PA-C on 06/10/2023 at that visit BP reading was 132/78 mmHg. Patient reported taking metformin  once daily and her husband wanted a BP medication for patient to take PRN. She brought BP cuff to office and BP readings were higher than office readings. At this visit metformin  was increased to 500 mg PO BID and recommended patient to get a new BP cuff. Was referred to pharmacist for BP follow up.   PMH is significant for HTN, persistent asthma, diabetes (diagnosed on 06/10/2023 A1c of 7.1%), and GERD.   Today, patient arrives with husband and interpreter. Patient's husband brought in new BP monitoring and reports monitoring his wife's BP readings at home. Latest BP reading from yesterday 105/61 mmHg pulse 59. He reports the highest he has seen her BP is 110 (systolic) but does not recall diastolic number. Patient reports that when she starts to feel dizziness, headache, fatigue, and heart palpitations typically in the night time, she measures her BP and it reads 120 systolic. She reports that these symptoms keep her up at night sometimes lasting from 1.5 to 2 hours or even longer. She reports these symptoms started at least 3 years ago and occurs about 3 nights/week. She is not on any anti-hypertensive medications.   Family/Social history:  -Fhx: no pertinent positives  -Tobacco: never smoker  -Alcohol: none reported  O:  Vitals:   07/16/23 1105  BP: 119/68  Pulse: 69     Last 3 Office BP readings: BP Readings from Last 3 Encounters:  07/16/23 119/68  06/10/23 132/78  05/17/23 120/68   BMET    Component Value Date/Time   NA 141 01/22/2023 1427   K 4.0 01/22/2023 1427   CL 102 01/22/2023 1427   CO2 23 01/22/2023 1427   GLUCOSE 184 (H) 01/22/2023 1427   GLUCOSE 101 (H) 05/26/2016 1044   BUN 14 01/22/2023 1427    CREATININE 0.78 01/22/2023 1427   CREATININE 0.98 05/26/2016 1044   CALCIUM  9.5 01/22/2023 1427   GFRNONAA 74 02/23/2020 1044   GFRNONAA 83 08/13/2014 0936   GFRAA 86 02/23/2020 1044   GFRAA >89 08/13/2014 0936    Renal function: CrCl cannot be calculated (Patient's most recent lab result is older than the maximum 21 days allowed.).  Clinical ASCVD: Yes  The 10-year ASCVD risk score (Arnett DK, et al., 2019) is: 16%   Values used to calculate the score:     Age: 22 years     Sex: Female     Is Non-Hispanic African American: No     Diabetic: Yes     Tobacco smoker: No     Systolic Blood Pressure: 119 mmHg     Is BP treated: No     HDL Cholesterol: 43 mg/dL     Total Cholesterol: 257 mg/dL  Patient is participating in a Managed Medicaid Plan:no  A/P:  Patient was initially concerned with blood pressure readings she interpreted as being high, but current office BP and home BP readings are normotensive to low. Referral to cardiologist was placed for evaluation due to variable BP readings, symptoms (dizziness, heart palpitations, and headache).  - Referral placed for cardiologist to evaluate CV status.  - Encouraged patient to check BP at home and bring log of readings to next visit. Counseled on proper use of home BP  cuff.   Results reviewed and written information provided.    Written patient instructions provided. Patient verbalized understanding of treatment plan.  Total time in face to face counseling 30 minutes.    Follow-up:  PCP clinic visit on 07/27/2023.   Georges Kings  PharmD Candidate Class of [employer] [address]  Marene Shape, PharmD, [place], CPP Clinical Pharmacist [facility] [phone]

## 2023-07-23 ENCOUNTER — Ambulatory Visit: Payer: 59 | Admitting: Nurse Practitioner

## 2023-07-27 ENCOUNTER — Ambulatory Visit (HOSPITAL_BASED_OUTPATIENT_CLINIC_OR_DEPARTMENT_OTHER): Payer: 59 | Admitting: Nurse Practitioner

## 2023-07-27 DIAGNOSIS — E7800 Pure hypercholesterolemia, unspecified: Secondary | ICD-10-CM | POA: Diagnosis not present

## 2023-07-27 DIAGNOSIS — E1165 Type 2 diabetes mellitus with hyperglycemia: Secondary | ICD-10-CM | POA: Diagnosis not present

## 2023-07-27 NOTE — Progress Notes (Unsigned)
 Assessment & Plan:  Diagnoses and all orders for this visit:  Hypercholesterolemia -     Lipid panel  Type 2 diabetes mellitus with hyperglycemia, unspecified whether long term insulin use (HCC) -     CMP14+EGFR    Patient has been counseled on age-appropriate routine health concerns for screening and prevention. These are reviewed and up-to-date. Referrals have been placed accordingly. Immunizations are up-to-date or declined.    Subjective:  No chief complaint on file.   Tina [name] 68 y.o. female presents to office today for follow-up to   She is accompanied by an onsite interpreter   She has a past medical history of Asthma, Diabetes mellitus without complication (HCC), and Nephrolithiasis    Patient has been counseled on age-appropriate routine health concerns for screening and prevention. These are reviewed and up-to-date. Referrals have been placed accordingly. Immunizations are up-to-date or declined.     [facility]ON CANCER SCREENING: she has been referred to GI and they have been unable to contact her to schedule. She was given the number today to schedule with their office Mammogram: UTD     BP Readings from Last 3 Encounters:  07/16/23 119/68  06/10/23 132/78  05/17/23 120/68    ROS  Past Medical History:  Diagnosis Date   Asthma    Diabetes mellitus without complication (HCC)    Nephrolithiasis    "came out on it's own" (12/21/2012)    Past Surgical History:  Procedure Laterality Date   CHOLECYSTECTOMY N/A 12/21/2012   Procedure: LAPAROSCOPIC CHOLECYSTECTOMY;  Surgeon: Shela Derby, MD;  Location: [facility] OR;  Service: General;  Laterality: N/A;   KIDNEY STONE SURGERY     spouse denies this hx on 12/21/2012   LAPAROSCOPIC CHOLECYSTECTOMY  12/21/2012    Family History  Problem Relation Age of Onset   Tuberculosis Mother    Asthma Mother    Asthma Father    Tuberculosis Father     Social History Reviewed with no changes to be made today.    Outpatient Medications Prior to Visit  Medication Sig Dispense Refill   albuterol  (VENTOLIN  HFA) 108 (90 Base) MCG/ACT inhaler Inhale 1-2 puffs into the lungs every 4 (four) hours as needed for wheezing or shortness of breath. 18 g 0   atorvastatin  (LIPITOR) 80 MG tablet Take 1 tablet (80 mg total) by mouth daily. FOR CHOLESTEROL 90 tablet 3   benzonatate  (TESSALON ) 100 MG capsule Take 2 capsules (200 mg total) by mouth 3 (three) times daily as needed. 40 capsule 0   betamethasone  dipropionate 0.05 % cream Apply topically 2 (two) times daily. Apply to right leg/rash 60 g 1   Blood Pressure Monitoring (BLOOD PRESSURE CUFF) MISC 1 each by Does not apply route daily. 1 each 0   diclofenac  Sodium (VOLTAREN ) 1 % GEL Apply 2 g topically 4 (four) times daily. For joint/bone pain 200 g 3   ferrous gluconate  (FERGON) 324 MG tablet Take 1 tablet (324 mg total) by mouth daily. FOR IRON or ANEMIA 90 tablet 1   Fluticasone -Umeclidin-Vilant (TRELEGY ELLIPTA ) 200-62.5-25 MCG/ACT AEPB Inhale 1 puff into the lungs daily. Rinse mouth after each use. 60 each 5   ibuprofen  (ADVIL ) 600 MG tablet Take 1 tablet (600 mg total) by mouth every 8 (eight) hours as needed. 60 tablet 1   metFORMIN  (GLUCOPHAGE ) 500 MG tablet Take 1 tablet (500 mg total) by mouth 2 (two) times daily with a meal. FOR DIABETES 60 tablet 3   montelukast  (SINGULAIR ) 10 MG tablet Take  1 tablet (10 mg total) by mouth at bedtime. 30 tablet 5   No facility-administered medications prior to visit.    Allergies  Allergen Reactions   Hctz [Hydrochlorothiazide ] Shortness Of Breath   Lisinopril  Shortness Of Breath   Sodium Bicarbonate Tablets [Sodium Bicarbonate] Palpitations       Objective:    There were no vitals taken for this visit. Wt Readings from Last 3 Encounters:  06/10/23 118 lb (53.5 kg)  02/15/23 119 lb 11.2 oz (54.3 kg)  01/22/23 121 lb 9.6 oz (55.2 kg)    Physical Exam       Patient has been counseled extensively about  nutrition and exercise as well as the importance of adherence with medications and regular follow-up. The patient was given clear instructions to go to ER or return to medical center if symptoms don't improve, worsen or new problems develop. The patient verbalized understanding.   Follow-up: No follow-ups on file.   Collins Dean, FNP-BC [facility] [place], [place] [phone]   07/27/2023, 10:11 AM

## 2023-07-28 LAB — LIPID PANEL
Chol/HDL Ratio: 2.7 ratio (ref 0.0–4.4)
Cholesterol, Total: 130 mg/dL (ref 100–199)
HDL: 48 mg/dL (ref >39)
LDL Chol Calc (NIH): 64 mg/dL (ref 0–99)
Triglycerides: 99 mg/dL (ref 0–149)
VLDL Cholesterol Cal: 18 mg/dL (ref 5–40)

## 2023-07-28 LAB — CMP14+EGFR
ALT: 27 IU/L (ref 0–32)
AST: 33 IU/L (ref 0–40)
Albumin: 4.6 g/dL (ref 3.9–4.9)
Alkaline Phosphatase: 101 IU/L (ref 44–121)
BUN/Creatinine Ratio: 12 (ref 12–28)
BUN: 8 mg/dL (ref 8–27)
Bilirubin Total: 0.6 mg/dL (ref 0.0–1.2)
CO2: 23 mmol/L (ref 20–29)
Calcium: 9.6 mg/dL (ref 8.7–10.3)
Chloride: 104 mmol/L (ref 96–106)
Creatinine, Ser: 0.66 mg/dL (ref 0.57–1.00)
Globulin, Total: 2.4 g/dL (ref 1.5–4.5)
Glucose: 105 mg/dL — ABNORMAL HIGH (ref 70–99)
Potassium: 4.4 mmol/L (ref 3.5–5.2)
Sodium: 143 mmol/L (ref 134–144)
Total Protein: 7 g/dL (ref 6.0–8.5)
eGFR: 95 mL/min/{1.73_m2} (ref >59)

## 2023-07-29 ENCOUNTER — Ambulatory Visit: Payer: Self-pay | Admitting: Nurse Practitioner

## 2023-08-02 ENCOUNTER — Encounter: Payer: Self-pay | Admitting: Nurse Practitioner

## 2023-09-19 NOTE — Progress Notes (Unsigned)
 Follow Up Note  RE: Tina [name] MRN: [id number] DOB: [birth year] Date of Office Visit: 09/20/2023  Referring provider: Theotis Haze ORN, NP Primary care provider: Theotis Haze ORN, NP  Chief Complaint: No chief complaint on file.  History of Present Illness: I had the pleasure of seeing Tina [name] for a follow up visit at the [facility] on 09/20/2023. She is a 68 y.o. female, who is being followed for asthma, allergic rhinoconjunctivitis. Her previous allergy office visit was on 05/17/2023 with Dr. Luke. Today is a regular follow up visit.  Discussed the use of AI scribe software for clinical note transcription with the patient, who gave verbal consent to proceed.  History of Present Illness            ***  Assessment and Plan: Tina [name] is a 68 y.o. female with: Moderate persistent asthma without complication Husband noted improvement with Trelegy 200mcg dose. Using albuterol  twice per week. No prednisone .  Today's spirometry was unremarkable given effort.  Daily controller medication(s):  Continue Trelegy 200mcg 1 puff once a day and rinse mouth after each use. May use albuterol  rescue inhaler 2 puffs every 4 to 6 hours as needed for shortness of breath, chest tightness, coughing, and wheezing. Monitor frequency of use - if you need to use it more than twice per week on a consistent basis let us  know.  Get spirometry at next visit.   Seasonal and perennial allergic rhinoconjunctivitis Past history - 2018 skin testing positive to tree, mold, dust mite.  Interim history - Taking nightly montelukast . Denies any nasal or eye symptoms. Continue environmental control measures. Continue environmental control measures. Continue Singulair  (montelukast ) 10mg  daily at night.   Follow up with PCP regarding your blood pressure concerns.  Make sure you take your blood pressure machine with you.   Assessment and Plan              No follow-ups on file.  No  orders of the defined types were placed in this encounter.  Lab Orders  No laboratory test(s) ordered today    Diagnostics: Spirometry:  Tracings reviewed. Her effort: {Blank single:19197::Good reproducible efforts.,It was hard to get consistent efforts and there is a question as to whether this reflects a maximal maneuver.,Poor effort, data can not be interpreted.} FVC: ***L FEV1: ***L, ***% predicted FEV1/FVC ratio: ***% Interpretation: {Blank single:19197::Spirometry consistent with mild obstructive disease,Spirometry consistent with moderate obstructive disease,Spirometry consistent with severe obstructive disease,Spirometry consistent with possible restrictive disease,Spirometry consistent with mixed obstructive and restrictive disease,Spirometry uninterpretable due to technique,Spirometry consistent with normal pattern,No overt abnormalities noted given today's efforts}.  Please see scanned spirometry results for details.  Skin Testing: {Blank single:19197::Select foods,Environmental allergy panel,Environmental allergy panel and select foods,Food allergy panel,None,Deferred due to recent antihistamines use}. *** Results discussed with patient/family.   Medication List:  Current Outpatient Medications  Medication Sig Dispense Refill   albuterol  (VENTOLIN  HFA) 108 (90 Base) MCG/ACT inhaler Inhale 1-2 puffs into the lungs every 4 (four) hours as needed for wheezing or shortness of breath. 18 g 0   atorvastatin  (LIPITOR) 80 MG tablet Take 1 tablet (80 mg total) by mouth daily. FOR CHOLESTEROL 90 tablet 3   benzonatate  (TESSALON ) 100 MG capsule Take 2 capsules (200 mg total) by mouth 3 (three) times daily as needed. 40 capsule 0   betamethasone  dipropionate 0.05 % cream Apply topically 2 (two) times daily. Apply to right leg/rash 60 g 1   Blood Pressure Monitoring (BLOOD PRESSURE CUFF)  MISC 1 each by Does not apply route daily. 1 each 0   diclofenac   Sodium (VOLTAREN ) 1 % GEL Apply 2 g topically 4 (four) times daily. For joint/bone pain 200 g 3   ferrous gluconate  (FERGON) 324 MG tablet Take 1 tablet (324 mg total) by mouth daily. FOR IRON or ANEMIA 90 tablet 1   Fluticasone -Umeclidin-Vilant (TRELEGY ELLIPTA ) 200-62.5-25 MCG/ACT AEPB Inhale 1 puff into the lungs daily. Rinse mouth after each use. 60 each 5   ibuprofen  (ADVIL ) 600 MG tablet Take 1 tablet (600 mg total) by mouth every 8 (eight) hours as needed. 60 tablet 1   metFORMIN  (GLUCOPHAGE ) 500 MG tablet Take 1 tablet (500 mg total) by mouth 2 (two) times daily with a meal. FOR DIABETES 60 tablet 3   montelukast  (SINGULAIR ) 10 MG tablet Take 1 tablet (10 mg total) by mouth at bedtime. 30 tablet 5   No current facility-administered medications for this visit.   Allergies: Allergies  Allergen Reactions   Hctz [Hydrochlorothiazide ] Shortness Of Breath   Lisinopril  Shortness Of Breath   Sodium Bicarbonate Tablets [Sodium Bicarbonate] Palpitations   I reviewed her past medical history, social history, family history, and environmental history and no significant changes have been reported from her previous visit.  Review of Systems  Constitutional:  Negative for appetite change, chills, fever and unexpected weight change.  HENT:  Negative for congestion and rhinorrhea.   Eyes:  Negative for itching.  Respiratory:  Positive for wheezing. Negative for cough, chest tightness and shortness of breath.   Gastrointestinal:  Negative for abdominal pain.  Skin:  Negative for rash.  Allergic/Immunologic: Positive for environmental allergies.  Neurological:  Negative for headaches.    Objective: There were no vitals taken for this visit. There is no height or weight on file to calculate BMI. Physical Exam Vitals and nursing note reviewed.  Constitutional:      Appearance: Normal appearance. She is well-developed.  HENT:     Head: Normocephalic and atraumatic.     Right Ear: Tympanic  membrane and external ear normal.     Left Ear: Tympanic membrane and external ear normal.     Nose: Nose normal.     Mouth/Throat:     Mouth: Mucous membranes are moist.     Pharynx: Oropharynx is clear.  Eyes:     Conjunctiva/sclera: Conjunctivae normal.  Cardiovascular:     Rate and Rhythm: Normal rate and regular rhythm.     Heart sounds: Normal heart sounds. No murmur heard. Pulmonary:     Effort: Pulmonary effort is normal.     Breath sounds: Normal breath sounds. No wheezing, rhonchi or rales.  Musculoskeletal:     Cervical back: Neck supple.  Skin:    General: Skin is warm and dry.     Findings: No rash.  Neurological:     Mental Status: She is alert and oriented to person, place, and time.  Psychiatric:        Behavior: Behavior normal.    Previous notes and tests were reviewed. The plan was reviewed with the patient/family, and all questions/concerned were addressed.  It was my pleasure to see Tina [name] today and participate in her care. Please feel free to contact me with any questions or concerns.  Sincerely,  Orlan Cramp, DO Allergy & Immunology  [facility]  [place] office: [phone] [facility] office: [phone]

## 2023-09-20 ENCOUNTER — Encounter: Payer: Self-pay | Admitting: Allergy

## 2023-09-20 ENCOUNTER — Other Ambulatory Visit: Payer: Self-pay

## 2023-09-20 ENCOUNTER — Ambulatory Visit (INDEPENDENT_AMBULATORY_CARE_PROVIDER_SITE_OTHER): Admitting: Allergy

## 2023-09-20 VITALS — BP 132/80 | HR 67 | Temp 98.2°F | Resp 18 | Ht <= 58 in | Wt 117.3 lb

## 2023-09-20 DIAGNOSIS — J4540 Moderate persistent asthma, uncomplicated: Secondary | ICD-10-CM | POA: Diagnosis not present

## 2023-09-20 DIAGNOSIS — J3089 Other allergic rhinitis: Secondary | ICD-10-CM

## 2023-09-20 DIAGNOSIS — H1010 Acute atopic conjunctivitis, unspecified eye: Secondary | ICD-10-CM

## 2023-09-20 DIAGNOSIS — J302 Other seasonal allergic rhinitis: Secondary | ICD-10-CM

## 2023-09-20 DIAGNOSIS — H1013 Acute atopic conjunctivitis, bilateral: Secondary | ICD-10-CM | POA: Diagnosis not present

## 2023-09-20 MED ORDER — MONTELUKAST SODIUM 10 MG PO TABS: 10.0000 mg | ORAL_TABLET | Freq: Every day | ORAL | 5 refills | Status: DC

## 2023-09-20 MED ORDER — TRELEGY ELLIPTA 200-62.5-25 MCG/ACT IN AEPB: 1.0000 | INHALATION_SPRAY | Freq: Every day | RESPIRATORY_TRACT | 5 refills | Status: DC

## 2023-09-20 MED ORDER — ALBUTEROL SULFATE HFA 108 (90 BASE) MCG/ACT IN AERS: 1.0000 | INHALATION_SPRAY | RESPIRATORY_TRACT | 0 refills | Status: DC | PRN

## 2023-09-20 NOTE — Patient Instructions (Addendum)
 BRING ALL YOUR INHALERS TO YOUR NEXT VISIT.  Asthma: Daily controller medication(s):  Continue Trelegy 200mcg 1 puff ONCE a day and rinse mouth after each use. May use albuterol  rescue inhaler 2 puffs every 4 to 6 hours as needed for shortness of breath, chest tightness, coughing, and wheezing. Monitor frequency of use - if you need to use it more than twice per week on a consistent basis let us  know.  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 Continue environmental control measures. Continue Singulair  (montelukast ) 10mg  daily at night.  Follow up in 4 months or sooner if needed.

## 2023-10-27 ENCOUNTER — Other Ambulatory Visit: Payer: Self-pay

## 2023-10-27 ENCOUNTER — Ambulatory Visit: Attending: Nurse Practitioner | Admitting: Nurse Practitioner

## 2023-10-27 ENCOUNTER — Other Ambulatory Visit: Payer: Self-pay | Admitting: Allergy

## 2023-10-27 ENCOUNTER — Encounter: Payer: Self-pay | Admitting: Nurse Practitioner

## 2023-10-27 VITALS — BP 134/75 | HR 61 | Resp 19 | Ht <= 58 in | Wt 118.6 lb

## 2023-10-27 DIAGNOSIS — K219 Gastro-esophageal reflux disease without esophagitis: Secondary | ICD-10-CM

## 2023-10-27 DIAGNOSIS — E785 Hyperlipidemia, unspecified: Secondary | ICD-10-CM

## 2023-10-27 DIAGNOSIS — Z23 Encounter for immunization: Secondary | ICD-10-CM

## 2023-10-27 DIAGNOSIS — D649 Anemia, unspecified: Secondary | ICD-10-CM | POA: Diagnosis not present

## 2023-10-27 DIAGNOSIS — E1165 Type 2 diabetes mellitus with hyperglycemia: Secondary | ICD-10-CM

## 2023-10-27 DIAGNOSIS — J4540 Moderate persistent asthma, uncomplicated: Secondary | ICD-10-CM

## 2023-10-27 DIAGNOSIS — R21 Rash and other nonspecific skin eruption: Secondary | ICD-10-CM

## 2023-10-27 LAB — POCT GLYCOSYLATED HEMOGLOBIN (HGB A1C): HbA1c, POC (controlled diabetic range): 6.3 % (ref 0.0–7.0)

## 2023-10-27 MED ORDER — METFORMIN HCL 500 MG PO TABS: 500.0000 mg | ORAL_TABLET | Freq: Two times a day (BID) | ORAL | 1 refills | Status: AC

## 2023-10-27 MED ORDER — ATORVASTATIN CALCIUM 80 MG PO TABS: 80.0000 mg | ORAL_TABLET | Freq: Every day | ORAL | 1 refills | Status: AC

## 2023-10-27 MED ORDER — ZOSTER VAC RECOMB ADJUVANTED 50 MCG/0.5ML IM SUSR
0.5000 mL | Freq: Once | INTRAMUSCULAR | 0 refills | Status: AC
  Filled 2023-10-27: qty 0.5, 1d supply, fill #0

## 2023-10-27 MED ORDER — CLOTRIMAZOLE-BETAMETHASONE 1-0.05 % EX CREA: 1.0000 | TOPICAL_CREAM | Freq: Every day | CUTANEOUS | 1 refills | Status: DC

## 2023-10-27 MED ORDER — CLOTRIMAZOLE-BETAMETHASONE 1-0.05 % EX CREA: 1.0000 | TOPICAL_CREAM | Freq: Every day | CUTANEOUS | 0 refills | Status: DC

## 2023-10-27 MED ORDER — CLOTRIMAZOLE-BETAMETHASONE 1-0.05 % EX CREA: 1.0000 | TOPICAL_CREAM | Freq: Every day | CUTANEOUS | 1 refills | Status: AC

## 2023-10-27 MED ORDER — OMEPRAZOLE 20 MG PO CPDR: 20.0000 mg | DELAYED_RELEASE_CAPSULE | Freq: Every day | ORAL | 1 refills | Status: AC

## 2023-10-27 MED ORDER — MONTELUKAST SODIUM 10 MG PO TABS: 10.0000 mg | ORAL_TABLET | Freq: Every day | ORAL | 1 refills | Status: DC

## 2023-10-27 NOTE — Progress Notes (Signed)
 Assessment & Plan:  Tina [name] was seen today for diabetes.  Diagnoses and all orders for this visit:  Type 2 diabetes mellitus with hyperglycemia, unspecified whether long term insulin use (HCC) -     POCT glycosylated hemoglobin (Hb A1C) -     Urine Albumin/Creatinine with ratio (send out) [LAB689] -     metFORMIN  (GLUCOPHAGE ) 500 MG tablet; Take 1 tablet (500 mg total) by mouth 2 (two) times daily with a meal. FOR DIABETES Type 2 diabetes mellitus with diabetic blurred vision Diabetes management improved, A1c decreased from 7.1 to 6.3. Blurred vision likely diabetes-related. She declines referral to an ophthalmologist despite vision loss risk. - Encouraged follow-up with an ophthalmologist for diabetic blurred vision.  Need for shingles vaccine -     Zoster Vaccine Adjuvanted (SHINGRIX ) injection; Inject 0.5 mLs into the muscle once for 1 dose.  Skin rash -     clotrimazole -betamethasone  (LOTRISONE ) cream; Apply 1 Application topically daily. For leg rash Dermatitis of right foot and left leg (possible eczema or tinea) Differential includes eczema and tinea. - Prescribed combination steroid and antifungal cream for dermatitis.   GERD without esophagitis -     omeprazole  (PRILOSEC) 20 MG capsule; Take 1 capsule (20 mg total) by mouth daily before breakfast. For heartburn GERD symptoms include burning sensation and reflux. - Prescribed omeprazole  once daily in the morning before meals.   Anemia, unspecified type -     CBC with Differential  Dyslipidemia, goal LDL below 100 -     atorvastatin  (LIPITOR) 80 MG tablet; Take 1 tablet (80 mg total) by mouth daily. FOR CHOLESTEROL  Moderate persistent asthma without complication well-controlled Symptoms well controlled -     montelukast  (SINGULAIR ) 10 MG tablet; Take 1 tablet (10 mg total) by mouth at bedtime.    Patient has been counseled on age-appropriate routine health concerns for screening and prevention. These are reviewed  and up-to-date. Referrals have been placed accordingly. Immunizations are up-to-date or declined.    Subjective:   Chief Complaint  Patient presents with   Diabetes    Tina [name] 68 y.o. female presents to office today for follow up to DM. She has her husband present as well as an onsite interpreter.   PMH: Ashtma, DM, nephrolithiasis   She has concerns with her right foot, noting itching and rough patchy areas of skin on the medial left lower leg as well. The condition worsens with certain foods, particularly chicken, and when the area is wet. She uses a cream (betamethasone ) that provides temporary relief, but symptoms return after some time.  GERD She has been experiencing acid reflux issues since last year, with symptoms occurring intermittently throughout the week. She describes a burning sensation in her stomach and reflux.  She has not tried any over-the-counter H2 antagonist. She is unsure of the specific foods that exacerbate her symptoms and has not been on any prescribed acid reflux medication recently.  She has a history of vision issues, including blurred vision and eye pain. She was referred for an eye exam in 2021 but is reluctant to follow up with an eye doctor.  She declines referral to ophthalmologist today.  DM 2 Great improvement with A1c lowering.  She is currently taking metformin  500 mg twice daily Lab Results  Component Value Date   HGBA1C 6.3 10/27/2023      Review of Systems  Constitutional:  Negative for fever, malaise/fatigue and weight loss.  HENT: Negative.  Negative for nosebleeds.   Eyes:  Positive for blurred vision. Negative for double vision and photophobia.  Respiratory: Negative.  Negative for cough and shortness of breath.   Cardiovascular: Negative.  Negative for chest pain, palpitations and leg swelling.  Gastrointestinal: Negative.  Negative for heartburn, nausea and vomiting.  Musculoskeletal: Negative.  Negative for myalgias.  Skin:   Positive for itching and rash.  Neurological: Negative.  Negative for dizziness, focal weakness, seizures and headaches.  Psychiatric/Behavioral: Negative.  Negative for suicidal ideas.     Past Medical History:  Diagnosis Date   Asthma    Diabetes mellitus without complication (HCC)    Nephrolithiasis    came out on it's own (12/21/2012)    Past Surgical History:  Procedure Laterality Date   CHOLECYSTECTOMY N/A 12/21/2012   Procedure: LAPAROSCOPIC CHOLECYSTECTOMY;  Surgeon: Lynda Leos, MD;  Location: [facility] OR;  Service: General;  Laterality: N/A;   KIDNEY STONE SURGERY     spouse denies this hx on 12/21/2012   LAPAROSCOPIC CHOLECYSTECTOMY  12/21/2012    Family History  Problem Relation Age of Onset   Tuberculosis Mother    Asthma Mother    Asthma Father    Tuberculosis Father     Social History Reviewed with no changes to be made today.   Outpatient Medications Prior to Visit  Medication Sig Dispense Refill   albuterol  (VENTOLIN  HFA) 108 (90 Base) MCG/ACT inhaler Inhale 1-2 puffs into the lungs every 4 (four) hours as needed for wheezing or shortness of breath. 18 g 0   Blood Pressure Monitoring (BLOOD PRESSURE CUFF) MISC 1 each by Does not apply route daily. 1 each 0   Fluticasone -Umeclidin-Vilant (TRELEGY ELLIPTA ) 200-62.5-25 MCG/ACT AEPB Inhale 1 puff into the lungs daily. ONE PUFF ONCE A DAY. Rinse mouth after each use. 60 each 5   ibuprofen  (ADVIL ) 600 MG tablet Take 1 tablet (600 mg total) by mouth every 8 (eight) hours as needed. 60 tablet 1   atorvastatin  (LIPITOR) 80 MG tablet Take 1 tablet (80 mg total) by mouth daily. FOR CHOLESTEROL 90 tablet 3   betamethasone  dipropionate 0.05 % cream Apply topically 2 (two) times daily. Apply to right leg/rash 60 g 1   diclofenac  Sodium (VOLTAREN ) 1 % GEL Apply 2 g topically 4 (four) times daily. For joint/bone pain 200 g 3   metFORMIN  (GLUCOPHAGE ) 500 MG tablet Take 1 tablet (500 mg total) by mouth 2 (two) times daily with  a meal. FOR DIABETES 60 tablet 3   montelukast  (SINGULAIR ) 10 MG tablet Take 1 tablet (10 mg total) by mouth at bedtime. 30 tablet 5   ferrous gluconate  (FERGON) 324 MG tablet Take 1 tablet (324 mg total) by mouth daily. FOR IRON or ANEMIA (Patient not taking: Reported on 10/27/2023) 90 tablet 1   benzonatate  (TESSALON ) 100 MG capsule Take 2 capsules (200 mg total) by mouth 3 (three) times daily as needed. (Patient not taking: Reported on 10/27/2023) 40 capsule 0   No facility-administered medications prior to visit.    Allergies  Allergen Reactions   Hctz [Hydrochlorothiazide ] Shortness Of Breath   Lisinopril  Shortness Of Breath   Sodium Bicarbonate Tablets [Sodium Bicarbonate] Palpitations       Objective:    BP 134/75 (BP Location: Left Arm, Patient Position: Sitting, Cuff Size: Normal)   Pulse 61   Resp 19   Ht 4' 9.48 (1.46 m)   Wt 118 lb 9.6 oz (53.8 kg)   SpO2 97%   BMI 25.24 kg/m  Wt Readings from Last 3 Encounters:  10/27/23  118 lb 9.6 oz (53.8 kg)  09/20/23 117 lb 4.8 oz (53.2 kg)  06/10/23 118 lb (53.5 kg)    Physical Exam Vitals and nursing note reviewed.  Constitutional:      Appearance: She is well-developed.  HENT:     Head: Normocephalic and atraumatic.  Cardiovascular:     Rate and Rhythm: Normal rate and regular rhythm.     Heart sounds: Normal heart sounds. No murmur heard.    No friction rub. No gallop.  Pulmonary:     Effort: Pulmonary effort is normal. No tachypnea or respiratory distress.     Breath sounds: Normal breath sounds. No decreased breath sounds, wheezing, rhonchi or rales.  Chest:     Chest wall: No tenderness.  Abdominal:     General: Bowel sounds are normal.     Palpations: Abdomen is soft.  Musculoskeletal:        General: Normal range of motion.     Cervical back: Normal range of motion.  Skin:    General: Skin is warm and dry.     Findings: Rash present. Rash is macular.      Neurological:     Mental Status: She is alert  and oriented to person, place, and time.     Coordination: Coordination normal.  Psychiatric:        Behavior: Behavior normal. Behavior is cooperative.        Thought Content: Thought content normal.        Judgment: Judgment normal.          Patient has been counseled extensively about nutrition and exercise as well as the importance of adherence with medications and regular follow-up. The patient was given clear instructions to go to ER or return to medical center if symptoms don't improve, worsen or new problems develop. The patient verbalized understanding.   Follow-up: Return in about 6 months (around 04/28/2024).   Haze LELON Servant, FNP-BC [facility] [place], [place] [phone]   10/27/2023, 1:37 PM

## 2023-10-28 ENCOUNTER — Ambulatory Visit: Payer: Self-pay | Admitting: Nurse Practitioner

## 2023-10-29 LAB — CBC WITH DIFFERENTIAL/PLATELET
Basophils Absolute: 0 x10E3/uL (ref 0.0–0.2)
Basos: 1 %
EOS (ABSOLUTE): 0.4 x10E3/uL (ref 0.0–0.4)
Eos: 5 %
Hematocrit: 42.9 % (ref 34.0–46.6)
Hemoglobin: 13.1 g/dL (ref 11.1–15.9)
Immature Grans (Abs): 0 x10E3/uL (ref 0.0–0.1)
Immature Granulocytes: 0 %
Lymphocytes Absolute: 2.4 x10E3/uL (ref 0.7–3.1)
Lymphs: 35 %
MCH: 25.1 pg — ABNORMAL LOW (ref 26.6–33.0)
MCHC: 30.5 g/dL — ABNORMAL LOW (ref 31.5–35.7)
MCV: 82 fL (ref 79–97)
Monocytes Absolute: 0.5 x10E3/uL (ref 0.1–0.9)
Monocytes: 8 %
Neutrophils Absolute: 3.6 x10E3/uL (ref 1.4–7.0)
Neutrophils: 51 %
Platelets: 176 x10E3/uL (ref 150–450)
RBC: 5.21 x10E6/uL (ref 3.77–5.28)
RDW: 13.9 % (ref 11.7–15.4)
WBC: 6.9 x10E3/uL (ref 3.4–10.8)

## 2023-10-29 LAB — MICROALBUMIN / CREATININE URINE RATIO
Creatinine, Urine: 60.5 mg/dL
Microalb/Creat Ratio: <5 mg/g{creat} (ref 0–29)
Microalbumin, Urine: <3 ug/mL

## 2023-11-12 ENCOUNTER — Ambulatory Visit: Admitting: Cardiology

## 2023-12-13 ENCOUNTER — Other Ambulatory Visit: Payer: Self-pay | Admitting: Pharmacist

## 2023-12-13 NOTE — Progress Notes (Signed)
 Pharmacy Quality Measure Review  This patient is appearing on a report for adherence measure for cholesterol (statin) medications this calendar year.   Medication: atorvastatin   Last fill date: 10/27/2023 for 90 day supply  Insurance report was not up to date. No action needed at this time.    Herlene Fleeta Morris, PharmD, JAQUELINE, CPP Clinical Pharmacist [facility] [phone]

## 2023-12-15 ENCOUNTER — Ambulatory Visit: Payer: Self-pay

## 2023-12-15 ENCOUNTER — Ambulatory Visit: Attending: Cardiology | Admitting: Cardiology

## 2023-12-15 ENCOUNTER — Encounter: Payer: Self-pay | Admitting: Cardiology

## 2023-12-15 VITALS — BP 136/66 | HR 63 | Resp 16 | Ht <= 58 in | Wt 122.0 lb

## 2023-12-15 DIAGNOSIS — E782 Mixed hyperlipidemia: Secondary | ICD-10-CM | POA: Diagnosis not present

## 2023-12-15 DIAGNOSIS — R002 Palpitations: Secondary | ICD-10-CM | POA: Diagnosis not present

## 2023-12-15 DIAGNOSIS — E119 Type 2 diabetes mellitus without complications: Secondary | ICD-10-CM

## 2023-12-15 NOTE — Progress Notes (Signed)
 Cardiology Office Note:    [name]    MRN: [id number] DOB:  02-01-1956   PCP:  Theotis Haze ORN, NP  Former Cardiology Providers: None Primary Cardiologist:  Madonna Large, DO, [facility] (established care 12/15/2023) Electrophysiologist:  None   Referring MD: Theotis Haze ORN, NP  Reason of Consult: Palpitations  Chief Complaint  Patient presents with   Palpitations   New Patient (Initial Visit)    History of Present Illness:    Tina [name] is a 68 y.o. Asian descent female whose past medical history and cardiovascular risk factors includes: Hyperlipidemia, diabetes mellitus type 2,. She is being seen today for the evaluation of palpitations at the request of Theotis Haze ORN, NP.  Patient is accompained here by her husband and a medical interpreter who provide translation services.   Palpitations:  Does not recall when the symptoms started Described as 'shaking inside' that occur intermittently Approximately once a month Duration: 15 minutes Self-limited Better: w/ drinking water  Associated w/ dyspnea and lightheadedness but denies chest pain or syncope. No near syncope or syncopal events.  Factors to consider: History of thyroid  disease: None History of anemia: None Coffee consumption: 1 cup / day  Caffeinated beverages/sodas: none Energy drinks: None Alcohol use: none  Stimulants: None Illicit drugs: none Weight loss supplements: none New over-the-counter medications: none Herbal supplements: none  Current Medications: Current Meds  Medication Sig   albuterol  (VENTOLIN  HFA) 108 (90 Base) MCG/ACT inhaler INHALE 1 TO 2 PUFFS BY MOUTH EVERY 4 HOURS AS NEEDED FOR WHEEZING FOR SHORTNESS OF BREATH   atorvastatin  (LIPITOR) 80 MG tablet Take 1 tablet (80 mg total) by mouth daily. FOR CHOLESTEROL   Blood Pressure Monitoring (BLOOD PRESSURE CUFF) MISC 1 each by Does not apply route daily.   clotrimazole -betamethasone  (LOTRISONE ) cream Apply 1 Application topically  daily. For leg rash   ferrous gluconate  (FERGON) 324 MG tablet Take 1 tablet (324 mg total) by mouth daily. FOR IRON or ANEMIA   Fluticasone -Umeclidin-Vilant (TRELEGY ELLIPTA ) 200-62.5-25 MCG/ACT AEPB Inhale 1 puff into the lungs daily. ONE PUFF ONCE A DAY. Rinse mouth after each use.   ibuprofen  (ADVIL ) 600 MG tablet Take 1 tablet (600 mg total) by mouth every 8 (eight) hours as needed.   metFORMIN  (GLUCOPHAGE ) 500 MG tablet Take 1 tablet (500 mg total) by mouth 2 (two) times daily with a meal. FOR DIABETES   montelukast  (SINGULAIR ) 10 MG tablet Take 1 tablet (10 mg total) by mouth at bedtime.   omeprazole  (PRILOSEC) 20 MG capsule Take 1 capsule (20 mg total) by mouth daily before breakfast. For heartburn     Allergies:    Hctz [hydrochlorothiazide ], Lisinopril , and Sodium bicarbonate tablets [sodium bicarbonate]   Past Medical History: Past Medical History:  Diagnosis Date   Asthma    Diabetes mellitus without complication (HCC)    Nephrolithiasis    came out on it's own (12/21/2012)    Past Surgical History: Past Surgical History:  Procedure Laterality Date   CHOLECYSTECTOMY N/A 12/21/2012   Procedure: LAPAROSCOPIC CHOLECYSTECTOMY;  Surgeon: Lynda Leos, MD;  Location: [facility] OR;  Service: General;  Laterality: N/A;   KIDNEY STONE SURGERY     spouse denies this hx on 12/21/2012   LAPAROSCOPIC CHOLECYSTECTOMY  12/21/2012    Social History: Social History   Tobacco Use   Smoking status: Never   Smokeless tobacco: Never  Vaping Use   Vaping status: Never Used  Substance Use Topics   Alcohol use: Not Currently  Drug use: Never    Family History: Family History  Problem Relation Age of Onset   Tuberculosis Mother    Asthma Mother    Asthma Father    Tuberculosis Father     ROS:   Review of Systems  Cardiovascular:  Positive for palpitations. Negative for chest pain, claudication, irregular heartbeat, leg swelling, near-syncope, orthopnea, paroxysmal nocturnal  dyspnea and syncope.  Respiratory:  Negative for shortness of breath.   Hematologic/Lymphatic: Negative for bleeding problem.    EKGs/Labs/Other Studies Reviewed:   EKG: EKG Interpretation Date/Time:  Wednesday December 15 2023 09:39:31 EDT Ventricular Rate:  69 PR Interval:  152 QRS Duration:  96 QT Interval:  378 QTC Calculation: 405 R Axis:   83  Text Interpretation: Normal sinus rhythm Incomplete right bundle branch block Nonspecific ST abnormality Confirmed by Michele Richardson [phone]) on 12/15/2023 9:47:19 AM  Echocardiogram: None   Labs:    Latest Ref Rng & Units 10/27/2023   11:20 AM 09/18/2022    3:47 PM 05/29/2022    3:32 PM  CBC  WBC 3.4 - 10.8 x10E3/uL 6.9  7.7  6.6   Hemoglobin 11.1 - 15.9 g/dL 86.8  86.8  86.6   Hematocrit 34.0 - 46.6 % 42.9  42.3  41.1   Platelets 150 - 450 x10E3/uL 176  182  202        Latest Ref Rng & Units 07/27/2023   10:22 AM 01/22/2023    2:27 PM 05/29/2022    3:32 PM  BMP  Glucose 70 - 99 mg/dL 894  815  882   BUN 8 - 27 mg/dL 8  14  16    Creatinine 0.57 - 1.00 mg/dL 9.33  9.21  9.09   BUN/Creat Ratio 12 - 28 12  18  18    Sodium 134 - 144 mmol/L 143  141  143   Potassium 3.5 - 5.2 mmol/L 4.4  4.0  4.2   Chloride 96 - 106 mmol/L 104  102  104   CO2 20 - 29 mmol/L 23  23  25    Calcium  8.7 - 10.3 mg/dL 9.6  9.5  9.7       Latest Ref Rng & Units 07/27/2023   10:22 AM 01/22/2023    2:27 PM 05/29/2022    3:32 PM  CMP  Glucose 70 - 99 mg/dL 894  815  882   BUN 8 - 27 mg/dL 8  14  16    Creatinine 0.57 - 1.00 mg/dL 9.33  9.21  9.09   Sodium 134 - 144 mmol/L 143  141  143   Potassium 3.5 - 5.2 mmol/L 4.4  4.0  4.2   Chloride 96 - 106 mmol/L 104  102  104   CO2 20 - 29 mmol/L 23  23  25    Calcium  8.7 - 10.3 mg/dL 9.6  9.5  9.7   Total Protein 6.0 - 8.5 g/dL 7.0  7.3  7.1   Total Bilirubin 0.0 - 1.2 mg/dL 0.6  0.4  0.3   Alkaline Phos 44 - 121 IU/L 101  83  82   AST 0 - 40 IU/L 33  27  22   ALT 0 - 32 IU/L 27  27  26      Lab Results   Component Value Date   CHOL 130 07/27/2023   HDL 48 07/27/2023   LDLCALC 64 07/27/2023   TRIG 99 07/27/2023   CHOLHDL 2.7 07/27/2023   No results for input(s): LIPOA in the  last 8760 hours. No components found for: NTPROBNP No results for input(s): PROBNP in the last 8760 hours. No results for input(s): TSH in the last 8760 hours.  Physical Exam:    Today's Vitals   12/15/23 0945  BP: 136/66  Pulse: 63  Resp: 16  SpO2: 96%  Weight: 122 lb (55.3 kg)  Height: 4' 9 (1.448 m)   Body mass index is 26.4 kg/m. Wt Readings from Last 3 Encounters:  12/15/23 122 lb (55.3 kg)  10/27/23 118 lb 9.6 oz (53.8 kg)  09/20/23 117 lb 4.8 oz (53.2 kg)    Physical Exam  Constitutional: No distress.  hemodynamically stable  Neck: No JVD present.  Cardiovascular: Normal rate, regular rhythm, S1 normal and S2 normal. Exam reveals no gallop, no S3 and no S4.  No murmur heard. Pulmonary/Chest: Effort normal and breath sounds normal. No stridor. She has no wheezes. She has no rales.  Musculoskeletal:        General: No edema.     Cervical back: Neck supple.  Skin: Skin is warm.   Impression & Recommendation(s):  Impression: 1. Palpitations   2. Type 2 diabetes mellitus without complication, without long-term current use of insulin (HCC)   3. Mixed hyperlipidemia     Recommendation(s):  Palpitations Symptom onset unknown, occurs intermittently, self-limited. No obvious reversible cause. Low probability for malignant arrhythmia. Though her symptoms are sporadic shared decision initially was to proceed with a cardiac monitor to evaluate for dysrhythmias, echocardiogram to evaluate for structural heart disease, TSH to rule out thyroid  disease.  However, prior to leaving patient notified nursing staff that she is going to hold off on workup for now until unless the symptoms worsen.  She will call us  back if any questions or concerns arise.  Type 2 diabetes mellitus without  complication, without long-term current use of insulin (HCC) Reemphasized the importance of glycemic control. Currently on oral antiglycemic agents Most recent hemoglobin A1c 6.3 as of August 2025.  Mixed hyperlipidemia Currently on Lipitor 80 mg p.o. nightly. Most recent LDL 64 mg/dL as of May 2025  Reviewed records provided by referring provider. Recommended additional testing as outlined above. EKG  independently reviewed 12/15/2023 Prescription drug management. In person medical interpretation services were utilized for today's encounter. Coordination of care and patient education.  Orders Placed:  Orders Placed This Encounter  Procedures   EKG 12-Lead     Final Medication List:   No orders of the defined types were placed in this encounter.   There are no discontinued medications.   Current Outpatient Medications:    albuterol  (VENTOLIN  HFA) 108 (90 Base) MCG/ACT inhaler, INHALE 1 TO 2 PUFFS BY MOUTH EVERY 4 HOURS AS NEEDED FOR WHEEZING FOR SHORTNESS OF BREATH, Disp: 18 g, Rfl: 0   atorvastatin  (LIPITOR) 80 MG tablet, Take 1 tablet (80 mg total) by mouth daily. FOR CHOLESTEROL, Disp: 90 tablet, Rfl: 1   Blood Pressure Monitoring (BLOOD PRESSURE CUFF) MISC, 1 each by Does not apply route daily., Disp: 1 each, Rfl: 0   clotrimazole -betamethasone  (LOTRISONE ) cream, Apply 1 Application topically daily. For leg rash, Disp: 60 g, Rfl: 1   ferrous gluconate  (FERGON) 324 MG tablet, Take 1 tablet (324 mg total) by mouth daily. FOR IRON or ANEMIA, Disp: 90 tablet, Rfl: 1   Fluticasone -Umeclidin-Vilant (TRELEGY ELLIPTA ) 200-62.5-25 MCG/ACT AEPB, Inhale 1 puff into the lungs daily. ONE PUFF ONCE A DAY. Rinse mouth after each use., Disp: 60 each, Rfl: 5   ibuprofen  (ADVIL ) 600 MG  tablet, Take 1 tablet (600 mg total) by mouth every 8 (eight) hours as needed., Disp: 60 tablet, Rfl: 1   metFORMIN  (GLUCOPHAGE ) 500 MG tablet, Take 1 tablet (500 mg total) by mouth 2 (two) times daily with a  meal. FOR DIABETES, Disp: 180 tablet, Rfl: 1   montelukast  (SINGULAIR ) 10 MG tablet, Take 1 tablet (10 mg total) by mouth at bedtime., Disp: 90 tablet, Rfl: 1   omeprazole  (PRILOSEC) 20 MG capsule, Take 1 capsule (20 mg total) by mouth daily before breakfast. For heartburn, Disp: 90 capsule, Rfl: 1  Consent:   N/A  Disposition:   2 months Patient may be asked to follow-up sooner based on the results of the above-mentioned testing.  Her questions and concerns were addressed to her satisfaction. She voices understanding of the recommendations provided during this encounter.    Signed, Madonna Michele HAS, [facility]  A Division of [facility] [address], [place], [place] [address]  12/18/2023 7:55 PM

## 2023-12-15 NOTE — Patient Instructions (Signed)
 Medication Instructions:  Your physician recommends that you continue on your current medications as directed. Please refer to the Current Medication list given to you today.   *If you need a refill on your cardiac medications before your next appointment, please call your pharmacy*  Lab Work: TODAY:  TSH  If you have labs (blood work) drawn today and your tests are completely normal, you will receive your results only by: MyChart Message (if you have MyChart) OR A paper copy in the mail If you have any lab test that is abnormal or we need to change your treatment, we will call you to review the results.  Testing/Procedures: Your physician has requested that you have an echocardiogram. Echocardiography is a painless test that uses sound waves to create images of your heart. It provides your doctor with information about the size and shape of your heart and how well your heart's chambers and valves are working. This procedure takes approximately one hour. There are no restrictions for this procedure. Please do NOT wear cologne, perfume, aftershave, or lotions (deodorant is allowed). Please arrive 15 minutes prior to your appointment time.  Please note: We ask at that you not bring children with you during ultrasound (echo/ vascular) testing. Due to room size and safety concerns, children are not allowed in the ultrasound rooms during exams. Our front office staff cannot provide observation of children in our lobby area while testing is being conducted. An adult accompanying a patient to their appointment will only be allowed in the ultrasound room at the discretion of the ultrasound technician under special circumstances. We apologize for any inconvenience.   ZIO XT- Long Term Monitor Instructions  Your physician has requested you wear a ZIO patch monitor for 14 days.  This is a single patch monitor. [employer] supplies one patch monitor per enrollment. Additional stickers are not available.  Please do not apply patch if you will be having a Nuclear Stress Test,  Echocardiogram, Cardiac CT, MRI, or Chest Xray during the period you would be wearing the  monitor. The patch cannot be worn during these tests. You cannot remove and re-apply the  ZIO XT patch monitor.  Your ZIO patch monitor will be mailed 3 day USPS to your address on file. It may take 3-5 days  to receive your monitor after you have been enrolled.  Once you have received your monitor, please review the enclosed instructions. Your monitor  has already been registered assigning a specific monitor serial # to you.  Billing and Patient Assistance Program Information  We have supplied [employer] with any of your insurance information on file for billing purposes. [employer] offers a sliding scale Patient Assistance Program for patients that do not have  insurance, or whose insurance does not completely cover the cost of the ZIO monitor.  You must apply for the Patient Assistance Program to qualify for this discounted rate.  To apply, please call [employer] at [phone], select option 4, select option 2, ask to apply for  Patient Assistance Program. Meredeth will ask your household income, and how many people  are in your household. They will quote your out-of-pocket cost based on that information.  [employer] will also be able to set up a 68-month, interest-free payment plan if needed.  Applying the monitor   Shave hair from upper left chest.  Hold abrader disc by orange tab. Rub abrader in 40 strokes over the upper left chest as  indicated in your monitor instructions.  Clean area with 4  enclosed alcohol pads. Let dry.  Apply patch as indicated in monitor instructions. Patch will be placed under collarbone on left  side of chest with arrow pointing upward.  Rub patch adhesive wings for 2 minutes. Remove white label marked 1. Remove the white  label marked 2. Rub patch adhesive wings for 2 additional minutes.  While  looking in a mirror, press and release button in center of patch. A small green light will  flash 3-4 times. This will be your only indicator that the monitor has been turned on.  Do not shower for the first 24 hours. You may shower after the first 24 hours.  Press the button if you feel a symptom. You will hear a small click. Record Date, Time and  Symptom in the Patient Logbook.  When you are ready to remove the patch, follow instructions on the last 2 pages of Patient  Logbook. Stick patch monitor onto the last page of Patient Logbook.  Place Patient Logbook in the blue and white box. Use locking tab on box and tape box closed  securely. The blue and white box has prepaid postage on it. Please place it in the mailbox as  soon as possible. Your physician should have your test results approximately 7 days after the  monitor has been mailed back to [facility].  Call [facility] Customer Care at [phone] if you have questions regarding  your ZIO XT patch monitor. Call them immediately if you see an orange light blinking on your  monitor.  If your monitor falls off in less than 4 days, contact our Monitor department at [phone].  If your monitor becomes loose or falls off after 4 days call [employer] at [phone] for  suggestions on securing your monitor   Follow-Up: At [facility], you and your health needs are our priority.  As part of our continuing mission to provide you with exceptional heart care, our providers are all part of one team.  This team includes your primary Cardiologist (physician) and Advanced Practice Providers or APPs (Physician Assistants and Nurse Practitioners) who all work together to provide you with the care you need, when you need it.  Your next appointment:   2 month(s)  Provider:   Madonna Large, DO    We recommend signing up for the patient portal called MyChart.  Sign up information is provided on this After Visit Summary.  MyChart  is used to connect with patients for Virtual Visits (Telemedicine).  Patients are able to view lab/test results, encounter notes, upcoming appointments, etc.  Non-urgent messages can be sent to your provider as well.   To learn more about what you can do with MyChart, go to [email or web address].   Other Instructions

## 2024-01-26 ENCOUNTER — Ambulatory Visit: Admitting: Allergy

## 2024-01-26 ENCOUNTER — Other Ambulatory Visit: Payer: Self-pay | Admitting: Pharmacist

## 2024-01-26 ENCOUNTER — Encounter: Payer: Self-pay | Admitting: Allergy

## 2024-01-26 VITALS — BP 132/78 | HR 68 | Ht <= 58 in | Wt 119.0 lb

## 2024-01-26 DIAGNOSIS — J4540 Moderate persistent asthma, uncomplicated: Secondary | ICD-10-CM

## 2024-01-26 DIAGNOSIS — H1010 Acute atopic conjunctivitis, unspecified eye: Secondary | ICD-10-CM

## 2024-01-26 DIAGNOSIS — J302 Other seasonal allergic rhinitis: Secondary | ICD-10-CM

## 2024-01-26 DIAGNOSIS — H1013 Acute atopic conjunctivitis, bilateral: Secondary | ICD-10-CM

## 2024-01-26 DIAGNOSIS — J3089 Other allergic rhinitis: Secondary | ICD-10-CM

## 2024-01-26 MED ORDER — TRELEGY ELLIPTA 200-62.5-25 MCG/ACT IN AEPB: 1.0000 | INHALATION_SPRAY | Freq: Every day | RESPIRATORY_TRACT | 11 refills | Status: AC

## 2024-01-26 MED ORDER — MONTELUKAST SODIUM 10 MG PO TABS: 10.0000 mg | ORAL_TABLET | Freq: Every day | ORAL | 3 refills | Status: AC

## 2024-01-26 NOTE — Progress Notes (Signed)
 Pharmacy Quality Measure Review  This patient is appearing on a report for adherence measure for cholesterol (statin) medications this calendar year.   Medication: atorvastatin   Last fill date: 01/25/24. Has not yet been dispensed.   Called patient and instructed her to go and pick this up.  Herlene Fleeta Morris, PharmD, JAQUELINE, CPP Clinical Pharmacist [facility] [phone]

## 2024-01-26 NOTE — Patient Instructions (Addendum)
 BRING ALL YOUR INHALERS TO YOUR NEXT VISIT.  Asthma: Daily controller medication(s):  Continue Trelegy 200mcg 1 puff ONCE a day and rinse mouth after each use. Take it in the morning. May use albuterol  rescue inhaler 2 puffs every 4 to 6 hours as needed for shortness of breath, chest tightness, coughing, and wheezing. Monitor frequency of use - if you need to use it more than twice per week on a consistent basis let us  know.  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 Continue environmental control measures. Continue Singulair  (montelukast ) 10mg  daily at night.  Follow up in 6 months or sooner if needed.   Follow up with cardiology regarding the palpitations.  As they wanted to do some work up.

## 2024-01-26 NOTE — Progress Notes (Signed)
 Follow Up Note  RE: Tina [name] MRN: [id number] DOB: Jun 04, 1955 Date of Office Visit: 01/26/2024  Referring provider: Theotis Haze ORN, NP Primary care provider: Theotis Haze ORN, NP  Chief Complaint: Follow-up (4 months f/u)  History of Present Illness: I had the pleasure of seeing Tina [name] for a follow up visit at the [facility] on 01/26/2024. She is a 68 y.o. female, who is being followed for asthma, allergic rhinoconjunctivitis. Her previous allergy office visit was on 09/20/2023 with Dr. Luke. Today is a regular follow up visit. [place] interpreter present in person.   Discussed the use of AI scribe software for clinical note transcription with the patient, who gave verbal consent to proceed.    She experiences palpitations primarily at night, describing them as her heart 'beating fast'. These episodes do not occur during the daytime. She has a history of seeing a cardiologist last month for heart racing but declined further testing at that time.  She experiences chills mostly at night, which coincide with the palpitations and disrupt her sleep. She drinks a lot of water during these episodes to help calm down. No recent fevers are reported.  She uses Trelegy inhaler, taking one puff once a day, typically at night, and takes montelukast  daily before bed. She is also on metformin , omeprazole , and atorvastatin .      Assessment and Plan: Niambi is a 68 y.o. female with: Moderate persistent asthma without complication Rare albuterol  use.  Today's spirometry was unremarkable given effort.  Daily controller medication(s):  Continue Trelegy 200mcg 1 puff ONCE a day and rinse mouth after each use. Take it in the morning as rarely it can cause palpitations. May use albuterol  rescue inhaler 2 puffs every 4 to 6 hours as needed for shortness of breath, chest tightness, coughing, and wheezing. Monitor frequency of use - if you need to use it more than twice per week on  a consistent basis let us  know.  Get spirometry at next visit.   Seasonal and perennial allergic rhinoconjunctivitis Past history - 2018 skin testing positive to tree, mold, dust mite.  Interim history - Taking nightly montelukast  with good benefit.  Continue environmental control measures. Continue Singulair  (montelukast ) 10mg  daily at night.  Follow up with cardiology regarding the palpitations.  Return in about 6 months (around 07/25/2024).  Meds ordered this encounter  Medications   Fluticasone -Umeclidin-Vilant (TRELEGY ELLIPTA ) 200-62.5-25 MCG/ACT AEPB    Sig: Inhale 1 puff into the lungs daily. ONE PUFF ONCE A DAY. Rinse mouth after each use.    Dispense:  60 each    Refill:  11   montelukast  (SINGULAIR ) 10 MG tablet    Sig: Take 1 tablet (10 mg total) by mouth at bedtime.    Dispense:  90 tablet    Refill:  3   Lab Orders  No laboratory test(s) ordered today    Diagnostics: Spirometry:  Tracings reviewed. Her effort: It was hard to get consistent efforts and there is a question as to whether this reflects a maximal maneuver. FVC: 1.95L FEV1: 1.68L, 104% predicted FEV1/FVC ratio: 86% Interpretation: No overt abnormalities noted given today's efforts.  Please see scanned spirometry results for details.  Results discussed with patient/family.   Medication List:  Current Outpatient Medications  Medication Sig Dispense Refill   albuterol  (VENTOLIN  HFA) 108 (90 Base) MCG/ACT inhaler INHALE 1 TO 2 PUFFS BY MOUTH EVERY 4 HOURS AS NEEDED FOR WHEEZING FOR SHORTNESS OF BREATH 18 g 0  atorvastatin  (LIPITOR) 80 MG tablet Take 1 tablet (80 mg total) by mouth daily. FOR CHOLESTEROL 90 tablet 1   Blood Pressure Monitoring (BLOOD PRESSURE CUFF) MISC 1 each by Does not apply route daily. 1 each 0   clotrimazole -betamethasone  (LOTRISONE ) cream Apply 1 Application topically daily. For leg rash 60 g 1   ferrous gluconate  (FERGON) 324 MG tablet Take 1 tablet (324 mg total) by mouth  daily. FOR IRON or ANEMIA 90 tablet 1   ibuprofen  (ADVIL ) 600 MG tablet Take 1 tablet (600 mg total) by mouth every 8 (eight) hours as needed. 60 tablet 1   metFORMIN  (GLUCOPHAGE ) 500 MG tablet Take 1 tablet (500 mg total) by mouth 2 (two) times daily with a meal. FOR DIABETES 180 tablet 1   omeprazole  (PRILOSEC) 20 MG capsule Take 1 capsule (20 mg total) by mouth daily before breakfast. For heartburn 90 capsule 1   Fluticasone -Umeclidin-Vilant (TRELEGY ELLIPTA ) 200-62.5-25 MCG/ACT AEPB Inhale 1 puff into the lungs daily. ONE PUFF ONCE A DAY. Rinse mouth after each use. 60 each 11   montelukast  (SINGULAIR ) 10 MG tablet Take 1 tablet (10 mg total) by mouth at bedtime. 90 tablet 3   No current facility-administered medications for this visit.   Allergies: Allergies  Allergen Reactions   Hctz [Hydrochlorothiazide ] Shortness Of Breath   Lisinopril  Shortness Of Breath   Sodium Bicarbonate Tablets [Sodium Bicarbonate] Palpitations   I reviewed her past medical history, social history, family history, and environmental history and no significant changes have been reported from her previous visit.  Review of Systems  Constitutional:  Positive for chills (at night). Negative for appetite change, fever and unexpected weight change.  HENT:  Negative for congestion and rhinorrhea.   Eyes:  Negative for itching.  Respiratory:  Negative for cough, chest tightness, shortness of breath and wheezing.   Cardiovascular:  Positive for palpitations.  Gastrointestinal:  Negative for abdominal pain.  Skin:  Negative for rash.  Allergic/Immunologic: Positive for environmental allergies.  Neurological:  Negative for headaches.    Objective: BP 132/78   Pulse 68   Ht 4' 9 (1.448 m)   Wt 119 lb (54 kg)   SpO2 98%   BMI 25.75 kg/m  Body mass index is 25.75 kg/m. Physical Exam Vitals and nursing note reviewed.  Constitutional:      Appearance: Normal appearance. She is well-developed.  HENT:      Head: Normocephalic and atraumatic.     Right Ear: Tympanic membrane and external ear normal.     Left Ear: Tympanic membrane and external ear normal.     Nose: Nose normal.     Mouth/Throat:     Mouth: Mucous membranes are moist.     Pharynx: Oropharynx is clear.  Eyes:     Conjunctiva/sclera: Conjunctivae normal.  Cardiovascular:     Rate and Rhythm: Normal rate and regular rhythm.     Heart sounds: Normal heart sounds. No murmur heard. Pulmonary:     Effort: Pulmonary effort is normal.     Breath sounds: Normal breath sounds. No wheezing, rhonchi or rales.  Musculoskeletal:     Cervical back: Neck supple.  Skin:    General: Skin is warm and dry.     Findings: No rash.  Neurological:     Mental Status: She is alert and oriented to person, place, and time.  Psychiatric:        Behavior: Behavior normal.    Previous notes and tests were reviewed. The plan was reviewed  with the patient/family, and all questions/concerned were addressed.  It was my pleasure to see Zyaire today and participate in her care. Please feel free to contact me with any questions or concerns.  Sincerely,  Orlan Cramp, DO Allergy & Immunology  [facility]  [place] office: [phone] [facility] office: [phone]

## 2024-03-29 ENCOUNTER — Telehealth: Payer: Self-pay | Admitting: Pharmacist

## 2024-03-29 NOTE — Telephone Encounter (Signed)
 Patient has not been seen since 10/2023. I am looking at my medication reports and she is not filling medications consistently. Can we attempt to schedule her for a PCP appointment?

## 2024-05-01 ENCOUNTER — Ambulatory Visit: Admitting: Nurse Practitioner

## 2024-07-24 ENCOUNTER — Ambulatory Visit: Admitting: Allergy
